# Patient Record
Sex: Female | Born: 1970 | Race: Black or African American | Hispanic: No | Smoking: Former smoker
Health system: Southern US, Community
[De-identification: ages and names within clinical notes are randomized; demographics above are authoritative.]

## PROBLEM LIST (undated history)

## (undated) DIAGNOSIS — F1910 Other psychoactive substance abuse, uncomplicated: Secondary | ICD-10-CM

## (undated) DIAGNOSIS — Z5900 Homelessness unspecified: Secondary | ICD-10-CM

## (undated) DIAGNOSIS — I639 Cerebral infarction, unspecified: Secondary | ICD-10-CM

## (undated) DIAGNOSIS — I10 Essential (primary) hypertension: Secondary | ICD-10-CM

## (undated) HISTORY — PX: CHOLECYSTECTOMY: SHX55

## (undated) HISTORY — PX: OTHER SURGICAL HISTORY: SHX169

---

## 1998-04-20 ENCOUNTER — Emergency Department (HOSPITAL_COMMUNITY): Admission: EM | Admit: 1998-04-20 | Discharge: 1998-04-20 | Payer: Self-pay | Admitting: Emergency Medicine

## 1999-01-17 ENCOUNTER — Encounter: Admission: RE | Admit: 1999-01-17 | Discharge: 1999-01-17 | Payer: Self-pay | Admitting: Sports Medicine

## 1999-05-19 ENCOUNTER — Inpatient Hospital Stay (HOSPITAL_COMMUNITY): Admission: AD | Admit: 1999-05-19 | Discharge: 1999-05-19 | Payer: Self-pay | Admitting: Obstetrics

## 1999-05-20 ENCOUNTER — Encounter: Payer: Self-pay | Admitting: Obstetrics

## 1999-05-29 ENCOUNTER — Other Ambulatory Visit: Admission: RE | Admit: 1999-05-29 | Discharge: 1999-05-29 | Payer: Self-pay | Admitting: Obstetrics

## 1999-07-21 ENCOUNTER — Inpatient Hospital Stay (HOSPITAL_COMMUNITY): Admission: AD | Admit: 1999-07-21 | Discharge: 1999-07-21 | Payer: Self-pay | Admitting: Obstetrics

## 1999-08-08 ENCOUNTER — Emergency Department (HOSPITAL_COMMUNITY): Admission: EM | Admit: 1999-08-08 | Discharge: 1999-08-08 | Payer: Self-pay | Admitting: Emergency Medicine

## 2000-06-24 ENCOUNTER — Other Ambulatory Visit: Admission: RE | Admit: 2000-06-24 | Discharge: 2000-06-24 | Payer: Self-pay | Admitting: Obstetrics

## 2000-07-21 ENCOUNTER — Inpatient Hospital Stay (HOSPITAL_COMMUNITY): Admission: AD | Admit: 2000-07-21 | Discharge: 2000-07-21 | Payer: Self-pay | Admitting: Obstetrics

## 2000-11-06 ENCOUNTER — Inpatient Hospital Stay (HOSPITAL_COMMUNITY): Admission: AD | Admit: 2000-11-06 | Discharge: 2000-11-06 | Payer: Self-pay | Admitting: Obstetrics

## 2000-12-06 ENCOUNTER — Observation Stay (HOSPITAL_COMMUNITY): Admission: AD | Admit: 2000-12-06 | Discharge: 2000-12-07 | Payer: Self-pay | Admitting: Obstetrics

## 2001-02-07 ENCOUNTER — Inpatient Hospital Stay (HOSPITAL_COMMUNITY): Admission: AD | Admit: 2001-02-07 | Discharge: 2001-02-09 | Payer: Self-pay | Admitting: Obstetrics

## 2001-02-07 ENCOUNTER — Encounter: Payer: Self-pay | Admitting: Obstetrics

## 2001-02-08 ENCOUNTER — Encounter (HOSPITAL_BASED_OUTPATIENT_CLINIC_OR_DEPARTMENT_OTHER): Payer: Self-pay | Admitting: General Surgery

## 2002-03-15 ENCOUNTER — Encounter: Payer: Self-pay | Admitting: Obstetrics

## 2002-03-15 ENCOUNTER — Inpatient Hospital Stay (HOSPITAL_COMMUNITY): Admission: AD | Admit: 2002-03-15 | Discharge: 2002-03-20 | Payer: Self-pay | Admitting: Obstetrics

## 2002-09-24 ENCOUNTER — Emergency Department (HOSPITAL_COMMUNITY): Admission: EM | Admit: 2002-09-24 | Discharge: 2002-09-25 | Payer: Self-pay | Admitting: Emergency Medicine

## 2002-09-25 ENCOUNTER — Emergency Department (HOSPITAL_COMMUNITY): Admission: EM | Admit: 2002-09-25 | Discharge: 2002-09-25 | Payer: Self-pay | Admitting: Emergency Medicine

## 2017-03-02 ENCOUNTER — Inpatient Hospital Stay (HOSPITAL_COMMUNITY)
Admission: AD | Admit: 2017-03-02 | Discharge: 2017-03-04 | DRG: 885 | Disposition: A | Discharge status: Home / Self Care | Payer: No Typology Code available for payment source | Source: Intra-hospital | Attending: Psychiatry | Admitting: Psychiatry

## 2017-03-02 ENCOUNTER — Emergency Department (HOSPITAL_COMMUNITY): Payer: Self-pay

## 2017-03-02 ENCOUNTER — Emergency Department (HOSPITAL_COMMUNITY)
Admission: EM | Admit: 2017-03-02 | Discharge: 2017-03-02 | Disposition: A | Discharge status: Home / Self Care | Payer: Self-pay | Attending: Emergency Medicine | Admitting: Emergency Medicine

## 2017-03-02 ENCOUNTER — Encounter (HOSPITAL_COMMUNITY): Payer: Self-pay | Admitting: Emergency Medicine

## 2017-03-02 DIAGNOSIS — F332 Major depressive disorder, recurrent severe without psychotic features: Principal | ICD-10-CM | POA: Diagnosis present

## 2017-03-02 DIAGNOSIS — T65892A Toxic effect of other specified substances, intentional self-harm, initial encounter: Secondary | ICD-10-CM | POA: Diagnosis present

## 2017-03-02 DIAGNOSIS — T5491XA Toxic effect of unspecified corrosive substance, accidental (unintentional), initial encounter: Secondary | ICD-10-CM

## 2017-03-02 DIAGNOSIS — G4700 Insomnia, unspecified: Secondary | ICD-10-CM | POA: Diagnosis present

## 2017-03-02 DIAGNOSIS — T543X2A Toxic effect of corrosive alkalis and alkali-like substances, intentional self-harm, initial encounter: Secondary | ICD-10-CM | POA: Insufficient documentation

## 2017-03-02 DIAGNOSIS — F17210 Nicotine dependence, cigarettes, uncomplicated: Secondary | ICD-10-CM | POA: Diagnosis present

## 2017-03-02 DIAGNOSIS — Z79899 Other long term (current) drug therapy: Secondary | ICD-10-CM

## 2017-03-02 LAB — I-STAT BETA HCG BLOOD, ED (MC, WL, AP ONLY): I-stat hCG, quantitative: 5.7 m[IU]/mL — ABNORMAL HIGH (ref <5)

## 2017-03-02 LAB — COMPREHENSIVE METABOLIC PANEL
ALBUMIN: 4.2 g/dL (ref 3.5–5.0)
ALT: 12 U/L — ABNORMAL LOW (ref 14–54)
AST: 17 U/L (ref 15–41)
Alkaline Phosphatase: 88 U/L (ref 38–126)
Anion gap: 10 (ref 5–15)
BILIRUBIN TOTAL: 0.6 mg/dL (ref 0.3–1.2)
BUN: 8 mg/dL (ref 6–20)
CHLORIDE: 103 mmol/L (ref 101–111)
CO2: 27 mmol/L (ref 22–32)
Calcium: 9.2 mg/dL (ref 8.9–10.3)
Creatinine, Ser: 0.61 mg/dL (ref 0.44–1.00)
GFR calc Af Amer: >60 mL/min (ref >60)
GFR calc non Af Amer: >60 mL/min (ref >60)
GLUCOSE: 85 mg/dL (ref 65–99)
POTASSIUM: 2.7 mmol/L — AB (ref 3.5–5.1)
Sodium: 140 mmol/L (ref 135–145)
TOTAL PROTEIN: 7.6 g/dL (ref 6.5–8.1)

## 2017-03-02 LAB — CBG MONITORING, ED: GLUCOSE-CAPILLARY: 80 mg/dL (ref 65–99)

## 2017-03-02 LAB — CBC
HEMATOCRIT: 38 % (ref 36.0–46.0)
HEMOGLOBIN: 11.3 g/dL — AB (ref 12.0–15.0)
MCH: 23 pg — ABNORMAL LOW (ref 26.0–34.0)
MCHC: 29.7 g/dL — ABNORMAL LOW (ref 30.0–36.0)
MCV: 77.4 fL — AB (ref 78.0–100.0)
Platelets: 285 10*3/uL (ref 150–400)
RBC: 4.91 MIL/uL (ref 3.87–5.11)
RDW: 17.3 % — ABNORMAL HIGH (ref 11.5–15.5)
WBC: 7 10*3/uL (ref 4.0–10.5)

## 2017-03-02 LAB — RAPID URINE DRUG SCREEN, HOSP PERFORMED
AMPHETAMINES: NOT DETECTED
BARBITURATES: NOT DETECTED
BENZODIAZEPINES: NOT DETECTED
Cocaine: POSITIVE — AB
Opiates: NOT DETECTED
TETRAHYDROCANNABINOL: NOT DETECTED

## 2017-03-02 LAB — SALICYLATE LEVEL: Salicylate Lvl: <7 mg/dL (ref 2.8–30.0)

## 2017-03-02 LAB — ETHANOL: Alcohol, Ethyl (B): <5 mg/dL (ref <5)

## 2017-03-02 LAB — MAGNESIUM: Magnesium: 1.9 mg/dL (ref 1.7–2.4)

## 2017-03-02 LAB — ACETAMINOPHEN LEVEL: Acetaminophen (Tylenol), Serum: <10 ug/mL — ABNORMAL LOW (ref 10–30)

## 2017-03-02 LAB — HCG, QUANTITATIVE, PREGNANCY: hCG, Beta Chain, Quant, S: <1 m[IU]/mL (ref <5)

## 2017-03-02 MED ORDER — ALUM & MAG HYDROXIDE-SIMETH 200-200-20 MG/5ML PO SUSP: 30.0000 mL | Freq: Four times a day (QID) | ORAL | Status: DC | PRN

## 2017-03-02 MED ORDER — POTASSIUM CHLORIDE 10 MEQ/100ML IV SOLN
10.0000 meq | INTRAVENOUS | Status: AC
  Administered 2017-03-02 (×2): 10 meq via INTRAVENOUS
  Filled 2017-03-02 (×3): qty 100

## 2017-03-02 MED ORDER — MAGNESIUM HYDROXIDE 400 MG/5ML PO SUSP: 30.0000 mL | Freq: Every day | ORAL | Status: DC | PRN

## 2017-03-02 MED ORDER — POTASSIUM CHLORIDE CRYS ER 20 MEQ PO TBCR
60.0000 meq | EXTENDED_RELEASE_TABLET | Freq: Once | ORAL | Status: AC
  Administered 2017-03-02: 60 meq via ORAL
  Filled 2017-03-02: qty 3

## 2017-03-02 MED ORDER — ONDANSETRON HCL 4 MG PO TABS: 4.0000 mg | ORAL_TABLET | Freq: Three times a day (TID) | ORAL | Status: DC | PRN

## 2017-03-02 MED ORDER — ACETAMINOPHEN 325 MG PO TABS: 650.0000 mg | ORAL_TABLET | ORAL | Status: DC | PRN

## 2017-03-02 NOTE — ED Notes (Signed)
Bed: [id number] Expected date:  Expected time:  Means of arrival:  Comments: [id number]

## 2017-03-02 NOTE — ED Notes (Signed)
Patient cell phone, car keys, and phone charger added to trash bag.

## 2017-03-02 NOTE — ED Triage Notes (Addendum)
Per [facility] patient comes from home for ingesting mixture of Comet, all purpose cleaner and glass cleaner with ammonia together in cup and drinking it. Patient called her daughter who is the one who called EMS.  Daughter told EMS that patient done this before about 20 years ago when she lived in [place].  Patient not wanting to answer questions at this time.  Patient does c/o sore throat and stomach burning.

## 2017-03-02 NOTE — ED Notes (Signed)
Introduced self to patient. Pt oriented to unit expectations.  Assessed pt for:  A) Anxiety &/or agitation: On admission to the [facility] pt is in bed, guarded with minimal verbal responses. She voiced no complaints. She did request a ginger-ale when asked if she wants something to drink.  S) Safety: Safety maintained with q-15-minute checks and hourly rounds by staff.  A) ADLs: Pt able to perform ADLs independently.  P) Pick-Up (room cleanliness): Pt's room clean and free of clutter.

## 2017-03-02 NOTE — ED Notes (Signed)
Main lab is going to add magnesium onto specimen already down in lab.

## 2017-03-02 NOTE — ED Notes (Signed)
Patient given scrubs and socks and instructed to get changed into them.

## 2017-03-02 NOTE — ED Notes (Signed)
Patient left the unit in accompany of pelham. Patient is stable and ambulatory. No distress noted. Belongings (wallet and phone) send with patient.

## 2017-03-02 NOTE — ED Notes (Signed)
According to Stacy, charge nurse in ED, pt ate a tray for lunch and drank tea on the tray. In the [facility] pt ate peanut butter and crackers and a ginger-ale. She said that her stomach feels good.

## 2017-03-02 NOTE — BH Assessment (Signed)
Alexandra Capriceonrad, DNP, recommends INPT treatment. [facility] at [facility]. Support paperwork completed. Nursing report [phone].

## 2017-03-02 NOTE — ED Notes (Signed)
Patient got out of bed and shut door to room.  This RN opened door to room and explained to patient that door has to stay open.

## 2017-03-02 NOTE — ED Notes (Addendum)
When asked if anyone harming or threatening her patient just responds by shaking head no.  Patient asked several times if wanting to harm herself, and if she drank mixture of products in attempt to harm herself. Patient stares off and refuses to answer question.  Patient does report vomiting at house.

## 2017-03-02 NOTE — ED Notes (Signed)
Patient's husband states that for several months now patient has been on drugs. He is requesting to talk to EDP. Made Dr Verdie Mosherliu aware.

## 2017-03-02 NOTE — ED Notes (Addendum)
Patient informed about [facility]. States she is aware of that. Report called and was asked to hold patient till 10 pm.

## 2017-03-02 NOTE — ED Notes (Signed)
ED Provider at bedside. 

## 2017-03-02 NOTE — Progress Notes (Signed)
CSW called and spoke to [employer] Secretary.  Asked that pt. Transfer be delayed until 10 PM.  Timmothy EulerJean T. Kaylyn LimSutter, MSW, LCSWA Clinical Social Work Disposition [phone]

## 2017-03-02 NOTE — ED Notes (Signed)
RN notified of abnormal lab 

## 2017-03-02 NOTE — ED Notes (Signed)
Patient has one white trash bag with dress, bottle of glass cleaner, bottle comet, and bottle of all purpose cleaner and cup with comet residue in it. Bag located in cabinet at blue nurse's station

## 2017-03-02 NOTE — ED Notes (Signed)
Called Poison Control, spoke with Rose,   Recommendations:  NPO min of 1 hour, obtain chest and abd xray, EKG, acetaminophen level and electrolytes.   If after hour-- all tests come back good and patient is stable, PO challenge patient with water.  If patient still having sore throat, swallowing issues, ongoing n/v then consult with GI.

## 2017-03-02 NOTE — BH Assessment (Signed)
Assessment Note  Alexandra Buckley is an 46 y.o. female. Patient presents to [facility] after a intentional overdose on cleaning products. She mix a concoction of amonia, bleach all purpose cleaner, glass cleaner, and comment prior to arrival. Her daughter apparently found out an contacted 911/EMS. Patient feeling depressed for the past month. Sts that her spouse is the trigger for her suicide attempt/depression. Patient refused to elaborate any further. The spouse spoke to ED staff sts that they are now seperated. He showed the EDP a video where she sent him this morning drinking the cleaners. He also expressed concerns that patient may be abusing drugs.   Patient admits that the igestion was a intentional suicide attempt. Denies prior suicide attempts/gestures. No self mutilating behaviors. She reports depressive symptoms including hopelessness, isolating self from others, worthlessness, and crying spells. No family history of mental health illness. No HI. No AVH's. She does not have a current psychiatrist and/or therapist. No history of INPT mental health issues. She denies current alcohol an drug use. Patient did however admit to "poppin Percocet's and Ambien" in March.  Diagnosis: Major Depressive Disorder, Recurrent, Severe, without psychotic features  Past Medical History: History reviewed. No pertinent past medical history.  History reviewed. No pertinent surgical history.  Family History: No family history on file.  Social History:  reports that she has been smoking Cigarettes.  She has never used smokeless tobacco. She reports that she does not drink alcohol. Her drug history is not on file.  Additional Social History:  Alcohol / Drug Use Pain Medications: SEE MAR Prescriptions: SEE MAR Over the Counter: SEE MAR History of alcohol / drug use?: Yes Substance #1 Name of Substance 1: "I was popping Percocets and Ambien in March...but I don't do that anymore" 1 - Age of First Use: 40's 1 -  Amount (size/oz): varied 1 - Frequency: "on and off throughout the month of March" 1 - Duration: on-going  1 - Last Use / Amount: March 2018  CIWA: CIWA-Ar BP: (!) 150/83 Pulse Rate: 76 COWS:    Allergies: No Known Allergies  Home Medications:  (Not in a hospital admission)  OB/GYN Status:  Patient's last menstrual period was 02/23/2017.  General Assessment Data Location of Assessment: [facility] TTS Assessment: In system Is this a Tele or Face-to-Face Assessment?: Face-to-Face Is this an Initial Assessment or a Re-assessment for this encounter?: Initial Assessment Marital status: Single Maiden name:  (unk) Is patient pregnant?: No Pregnancy Status: No Living Arrangements: Alone Can pt return to current living arrangement?: Yes Admission Status: Voluntary Is patient capable of signing voluntary admission?: Yes Referral Source: Self/Family/Friend Insurance type:  (SP)     Crisis Care Plan Living Arrangements: Alone Legal Guardian: Other: (no legal guardian ) Name of Psychiatrist:  (no psychiatrist ) Name of Therapist:  (no therapist )  Education Status Is patient currently in school?: No Current Grade: n/a Highest grade of school patient has completed:  (12th grade) Name of school:  (n/a) Contact person:  (n/a)  Risk to self with the past 6 months Suicidal Ideation: Yes-Currently Present Has patient been a risk to self within the past 6 months prior to admission? : Yes Suicidal Intent: Yes-Currently Present Has patient had any suicidal intent within the past 6 months prior to admission? : Yes Is patient at risk for suicide?: Yes Suicidal Plan?: Yes-Currently Present Has patient had any suicidal plan within the past 6 months prior to admission? : Yes Specify Current Suicidal Plan:  (overdose using cleaning products)  Access to Means: Yes Specify Access to Suicidal Means:  (cleaning prodcucts ) What has been your use of drugs/alcohol within the last 12 months?:   (denies current use; reports a hx of "pill popping") Previous Attempts/Gestures: No How many times?:  (denies; 0) Other Self Harm Risks:  (denies self harm risk ) Triggers for Past Attempts: Other (Comment) (no past attempts or gestures ) Intentional Self Injurious Behavior: None Family Suicide History: No Recent stressful life event(s): Other (Comment) ("My husband"...pt did not elaborate any further ) Persecutory voices/beliefs?: No Depression: Yes Substance abuse history and/or treatment for substance abuse?: Yes Suicide prevention information given to non-admitted patients: Not applicable  Risk to Others within the past 6 months Homicidal Ideation: No Does patient have any lifetime risk of violence toward others beyond the six months prior to admission? : No Thoughts of Harm to Others: No Current Homicidal Intent: No Current Homicidal Plan: No Access to Homicidal Means: No Identified Victim:  (n/a) History of harm to others?: No Assessment of Violence: None Noted Violent Behavior Description:  (patient is calm and cooperative ) Does patient have access to weapons?: No Criminal Charges Pending?: No Does patient have a court date: No Is patient on probation?: Yes  Psychosis Hallucinations: None noted Delusions: None noted  Mental Status Report Appearance/Hygiene: In scrubs Eye Contact: Good Motor Activity: Freedom of movement Speech: Logical/coherent Level of Consciousness: Alert Mood: Depressed Affect: Appropriate to circumstance Anxiety Level: None Thought Processes: Relevant, Coherent Judgement: Impaired Orientation: Person, Time, Situation, Place Obsessive Compulsive Thoughts/Behaviors: None  Cognitive Functioning Concentration: Decreased Memory: Recent Intact, Remote Intact IQ: Average Insight: Poor Impulse Control: Poor Appetite: Poor Weight Loss:  (none reported) Weight Gain:  (none reported) Sleep: Decreased Total Hours of Sleep:  ("3 to 6 hrs per  night") Vegetative Symptoms: None  ADLScreening [facility] Assessment Services) Patient's cognitive ability adequate to safely complete daily activities?: Yes Patient able to express need for assistance with ADLs?: Yes Independently performs ADLs?: Yes (appropriate for developmental age)  Prior Inpatient Therapy Prior Inpatient Therapy: No Prior Therapy Dates:  (n/a) Prior Therapy Facilty/Provider(s):  (n/a) Reason for Treatment:  (n/a)  Prior Outpatient Therapy Prior Outpatient Therapy: No Prior Therapy Dates:  (n/a) Prior Therapy Facilty/Provider(s):  (n/a) Reason for Treatment:  (n/a) Does patient have an ACCT team?: No Does patient have Intensive In-House Services?  : No Does patient have [facility] services? : No Does patient have P4CC services?: No  ADL Screening (condition at time of admission) Patient's cognitive ability adequate to safely complete daily activities?: Yes Is the patient deaf or have difficulty hearing?: No Does the patient have difficulty seeing, even when wearing glasses/contacts?: No Does the patient have difficulty concentrating, remembering, or making decisions?: No Patient able to express need for assistance with ADLs?: Yes Does the patient have difficulty dressing or bathing?: No Independently performs ADLs?: Yes (appropriate for developmental age) Does the patient have difficulty walking or climbing stairs?: No Weakness of Legs: None Weakness of Arms/Hands: None       Abuse/Neglect Assessment (Assessment to be complete while patient is alone) Physical Abuse: Denies Verbal Abuse: Denies Sexual Abuse: Denies Exploitation of patient/patient's resources: Denies Self-Neglect: Denies Values / Beliefs Cultural Requests During Hospitalization: None Spiritual Requests During Hospitalization: None   Advance Directives (For Healthcare) Does Patient Have a Medical Advance Directive?: No Would patient like information on creating a medical advance  directive?: No - Patient declined Nutrition Screen- [facility] Patient's home diet: Regular  Additional Information 1:1 In Past  12 Months?: No CIRT Risk: No Elopement Risk: No Does patient have medical clearance?: Yes     Disposition:  Disposition Initial Assessment Completed for this Encounter: Yes Disposition of Patient: Inpatient treatment program, Referred to (Per Renata Capriceonrad, DNP, patient meets criteria for INPT treatment) Type of inpatient treatment program: Adult  On Site Evaluation by:   Reviewed with Physician:    Melynda Rippleoyka Rayburn Mundis 03/02/2017 3:45 PM

## 2017-03-02 NOTE — ED Notes (Signed)
Called [facility], RN unable to take report at this time. Will call back in about 15 mins per RN request.

## 2017-03-02 NOTE — ED Provider Notes (Signed)
[facility]-EMERGENCY DEPT Provider Note   CSN: [id number] Arrival date & time: 03/02/17  0830     History   Chief Complaint Chief Complaint  Patient presents with  . Ingestion    HPI Alexandra Buckley is a 46 y.o. female.  The history is provided by the patient.  Ingestion  This is a new problem. The current episode started 1 to 2 hours ago. The problem occurs rarely. The problem has not changed since onset.Associated symptoms include abdominal pain. Pertinent negatives include no chest pain, no headaches and no shortness of breath. Nothing aggravates the symptoms. Nothing relieves the symptoms. She has tried nothing for the symptoms.   46 year old female who presents with intentional ingestion, just prior to arrival. Patient reports that she drank a cupful of Comet, bleach all-purpose cleaner and glass cleaner with ammonia just prior to arrival. Denies any other co-ingestions such as alcohol, drug abuse, pills/medications. Does endorse suicide attempt, but is not forth coming in any further details. Endorses nausea and vomiting after ingestion, and also complains of burning in the back of her throat and in her abdomen.  I also spoke with patient's husband, who she is now separated from. He showed me video where she had sent him this morning of her drinking a backed of mixed cleaner. He expresses concern that she over the past few months has had drug abuse/addiction.    History reviewed. No pertinent past medical history.  There are no active problems to display for this patient.   History reviewed. No pertinent surgical history.  OB History    No data available       Home Medications    Prior to Admission medications   Not on File    Family History No family history on file.  Social History Social History  Substance Use Topics  . Smoking status: Current Every Day Smoker    Types: Cigarettes  . Smokeless tobacco: Never Used  . Alcohol use No     Allergies   Patient  has no known allergies.   Review of Systems Review of Systems  Constitutional: Negative for fever.  HENT: Positive for sore throat.   Respiratory: Negative for shortness of breath.   Cardiovascular: Negative for chest pain.  Gastrointestinal: Positive for abdominal pain.  Allergic/Immunologic: Negative for immunocompromised state.  Neurological: Negative for headaches.  Hematological: Does not bruise/bleed easily.  Psychiatric/Behavioral: Positive for self-injury and suicidal ideas. Negative for confusion.     Physical Exam Updated Vital Signs BP (!) 150/83   Pulse 76   Temp 98.5 F (36.9 C) (Oral)   Resp 19   LMP 02/23/2017   SpO2 100%   Physical Exam Physical Exam  Nursing note and vitals reviewed. Constitutional: Well developed, well nourished, non-toxic, and in no acute distress Head: Normocephalic and atraumatic.  Mouth/Throat: Oropharynx is clear and moist.  Neck: Normal range of motion. Neck supple.  Cardiovascular: Normal rate and regular rhythm.   Pulmonary/Chest: Effort normal and breath sounds normal.  Abdominal: Soft. There is mild epigastric tenderness. There is no rebound and no guarding.  Musculoskeletal: Normal range of motion.  Neurological: Alert, no facial droop, fluent speech, moves all extremities symmetrically Skin: Skin is warm and dry.  Psychiatric: withdrawn, no eye contact, occasionally tearful   ED Treatments / Results  Labs (all labs ordered are listed, but only abnormal results are displayed) Labs Reviewed  COMPREHENSIVE METABOLIC PANEL - Abnormal; Notable for the following:       Result Value  Potassium 2.7 (*)    ALT 12 (*)    All other components within normal limits  ACETAMINOPHEN LEVEL - Abnormal; Notable for the following:    Acetaminophen (Tylenol), Serum <10 (*)    All other components within normal limits  CBC - Abnormal; Notable for the following:    Hemoglobin 11.3 (*)    MCV 77.4 (*)    MCH 23.0 (*)    MCHC 29.7 (*)     RDW 17.3 (*)    All other components within normal limits  I-STAT BETA HCG BLOOD, ED ([facility], [facility], [facility] ONLY) - Abnormal; Notable for the following:    I-stat hCG, quantitative 5.7 (*)    All other components within normal limits  ETHANOL  SALICYLATE LEVEL  HCG, QUANTITATIVE, PREGNANCY  MAGNESIUM  RAPID URINE DRUG SCREEN, HOSP PERFORMED  CBG MONITORING, ED    EKG  EKG Interpretation  Date/Time:  Tuesday Mar 02 2017 08:33:10 EDT Ventricular Rate:  83 PR Interval:    QRS Duration: 77 QT Interval:  431 QTC Calculation: 507 R Axis:   26 Text Interpretation:  Ectopic atrial rhythm Borderline prolonged QT interval no prior EKG  Confirmed by LIU MD, DANA [phone]([id number]) on 03/02/2017 8:47:24 AM       Radiology Dg Chest 2 View  Result Date: 03/02/2017 CLINICAL DATA:  Burning in the throat, chest and abdomen after ingesting cleaning agents this morning. Smoker. EXAM: CHEST  2 VIEW COMPARISON:  None. FINDINGS: Normal sized heart. Clear lungs with normal vascularity. No pleural fluid. Minimal thoracic spine degenerative changes. Cholecystectomy clips. IMPRESSION: No acute abnormality. Electronically Signed   By: Beckie SaltsSteven  Reid M.D.   On: 03/02/2017 12:25   Dg Abd 2 Views  Result Date: 03/02/2017 CLINICAL DATA:  Burning in the throat, chest and abdomen after ingesting cleaning agents this morning. EXAM: ABDOMEN - 2 VIEW COMPARISON:  None. FINDINGS: Normal caliber loops of small bowel and colon with air-fluid levels. No free peritoneal air. Unremarkable bones. IMPRESSION: No acute abnormality. Electronically Signed   By: Beckie SaltsSteven  Reid M.D.   On: 03/02/2017 12:26    Procedures Procedures (including critical care time)  Medications Ordered in ED Medications  potassium chloride 10 mEq in 100 mL IVPB (10 mEq Intravenous New Bag/Given 03/02/17 1222)  potassium chloride SA (K-DUR,KLOR-CON) CR tablet 60 mEq (not administered)  acetaminophen (TYLENOL) tablet 650 mg (not administered)  ondansetron (ZOFRAN)  tablet 4 mg (not administered)  alum & mag hydroxide-simeth (MAALOX/MYLANTA) 200-200-20 MG/5ML suspension 30 mL (not administered)     Initial Impression / Assessment and Plan / ED Course  I have reviewed the triage vital signs and the nursing notes.  Pertinent labs & imaging results that were available during my care of the patient were reviewed by me and considered in my medical decision making (see chart for details).    The patient's nurse spoke with poison control who recommended an by mouth 1 hour. Recommending tox labs with chest and abdominal x-ray. If after one hour in all workup reassuring, recommending by mouth challenge.  Workup overall unremarkable aside from hypokalemia of 2.7. It is repleted IV and orally. Chest x-ray and abdominal x-ray visualized and shows no acute processes. I has been able to drink fluids without difficulty, and is felt to be medically cleared for TTS consult.  Final Clinical Impressions(s) / ED Diagnoses   Final diagnoses:  Ingestion of bleach    New Prescriptions New Prescriptions   No medications on file     Liu,  Neysa Bonito, MD 03/02/17 1318

## 2017-03-02 NOTE — ED Notes (Signed)
Rose with Poison Control cleared pt and closed her file because pt ate a lunch tray and drank a glass of tea, (according to [employer]Stacy charge nurse in ED), and pt also had peanut butter crackers and ginger-ale for snack and tolerated it well.

## 2017-03-02 NOTE — ED Notes (Signed)
Patient transported to X-ray 

## 2017-03-02 NOTE — ED Notes (Signed)
Bed: [id number] Expected date:  Expected time:  Means of arrival:  Comments: EMS-injestion

## 2017-03-02 NOTE — ED Notes (Signed)
Informed lab to add on lab

## 2017-03-03 ENCOUNTER — Encounter (HOSPITAL_COMMUNITY): Payer: Self-pay | Admitting: *Deleted

## 2017-03-03 DIAGNOSIS — R45851 Suicidal ideations: Secondary | ICD-10-CM

## 2017-03-03 DIAGNOSIS — F14120 Cocaine abuse with intoxication, uncomplicated: Secondary | ICD-10-CM

## 2017-03-03 DIAGNOSIS — F332 Major depressive disorder, recurrent severe without psychotic features: Principal | ICD-10-CM
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
## 2017-03-03 NOTE — BHH Group Notes (Signed)
B[facility] LCSW Group Therapy  03/03/2017 12:53 PM  Type of Therapy:  Group Therapy  Participation Level:  Active  Participation Quality:  Appropriate  Affect:  Appropriate  Cognitive:  Oriented  Insight:  Improving  Engagement in Therapy:  Engaged  Modes of Intervention:  Confrontation, Discussion, Education, Problem-solving, Socialization and Support  Summary of Progress/Problems: Today's Topic: Overcoming Obstacles. Patients identified one short term goal and potential obstacles in reaching this goal. Patients processed barriers involved in overcoming these obstacles. Patients identified steps necessary for overcoming these obstacles and explored motivation (internal and external) for facing these difficulties head on.   Alys Dulak N Smart LCSW 03/03/2017, 12:53 PM

## 2017-03-03 NOTE — H&P (Signed)
Psychiatric Admission Assessment Adult  Patient Identification: Alexandra Buckley MRN:  [id number] Date of Evaluation:  03/03/2017 Chief Complaint:  MDD REC SEV Principal Diagnosis: MDD (major depressive disorder), recurrent severe, without psychosis ([place]) Diagnosis:   Patient Active Problem List   Diagnosis Date Noted  . MDD (major depressive disorder), recurrent severe, without psychosis ([place]) [F33.2] 03/02/2017   History of Present Illness: Alexandra Buckley is an 46 y.o. female, married, have six children and none lives with her, admitted from [facility] fo intentional drug overdose as a suicide attempt. Patient states that she is seeking attention from family members and for intention to end her life and she has endorses drinking occasionally but no drug absue. She does not want to report separation or divorce but heard from her daughter that her husband is divorcing her. She minimizes her depression, anxiety and suicide or homicide ideation, intention or plan. She can not contract for safety during this evaluation. She denied psychosis, delusion and or paranoia.she becomes emotional, tearful and guilty about her intentional overdose. UDS is positive for cocaine. She denied regular drug abuse. Occasional alcohol drinking.   Below information from behavioral health assessment has been reviewed by me and I agreed with the findings. Patient presents to [facility] after a intentional overdose on cleaning products. She mix a concoction of amonia, bleach all purpose cleaner, glass cleaner, and comment prior to arrival. Her daughter apparently found out an contacted 911/EMS. Patient feeling depressed for the past month. Sts that her spouse is the trigger for her suicide attempt/depression. Patient refused to elaborate any further. The spouse spoke to ED staff sts that they are now seperated. He showed the EDP a video where she sent him this morning drinking the cleaners. He also expressed concerns that patient may be  abusing drugs.   Patient admits that the igestion was a intentional suicide attempt. Denies prior suicide attempts/gestures. No self mutilating behaviors. She reports depressive symptoms including hopelessness, isolating self from others, worthlessness, and crying spells. No family history of mental health illness. No HI. No AVH's. She does not have a current psychiatrist and/or therapist. No history of INPT mental health issues. She denies current alcohol an drug use. Patient did however admit to "poppin Percocet's and Ambien" in March.  Associated Signs/Symptoms: Depression Symptoms:  depressed mood, anhedonia, psychomotor retardation, fatigue, feelings of worthlessness/guilt, hopelessness, recurrent thoughts of death, suicidal attempt, anxiety, loss of energy/fatigue, weight loss, decreased labido, decreased appetite, (Hypo) Manic Symptoms:  Distractibility, Impulsivity, Irritable Mood, Labiality of Mood, Anxiety Symptoms:  Excessive Worry, Psychotic Symptoms:  denied. PTSD Symptoms: NA Total Time spent with patient: 1 hour  Past Psychiatric History: Denied.  Is the patient at risk to self? Yes.    Has the patient been a risk to self in the past 6 months? No.  Has the patient been a risk to self within the distant past? No.  Is the patient a risk to others? No.  Has the patient been a risk to others in the past 6 months? No.  Has the patient been a risk to others within the distant past? No.   Prior Inpatient Therapy:   Prior Outpatient Therapy:    Alcohol Screening: 1. How often do you have a drink containing alcohol?: Monthly or less 2. How many drinks containing alcohol do you have on a typical day when you are drinking?: 1 or 2 3. How often do you have six or more drinks on one occasion?: Less than monthly Preliminary Score: 1 9. Have  you or someone else been injured as a result of your drinking?: No 10. Has a relative or friend or a doctor or another health  worker been concerned about your drinking or suggested you cut down?: No Alcohol Use Disorder Identification Test Final Score (AUDIT): 2 Brief Intervention: AUDIT score less than 7 or less-screening does not suggest unhealthy drinking-brief intervention not indicated Substance Abuse History in the last 12 months:  No. Consequences of Substance Abuse: NA Previous Psychotropic Medications: No  Psychological Evaluations: Yes  Past Medical History: History reviewed. No pertinent past medical history. History reviewed. No pertinent surgical history. Family History: History reviewed. No pertinent family history. Family Psychiatric  History: Denied Tobacco Screening: Have you used any form of tobacco in the last 30 days? (Cigarettes, Smokeless Tobacco, Cigars, and/or Pipes): Yes Tobacco use, Select all that apply: 5 or more cigarettes per day Are you interested in Tobacco Cessation Medications?: No, patient refused Counseled patient on smoking cessation including recognizing danger situations, developing coping skills and basic information about quitting provided: Refused/Declined practical counseling Social History:  History  Alcohol Use No     History  Drug use: Unknown    Additional Social History:                           Allergies:  No Known Allergies Lab Results:  Results for orders placed or performed during the hospital encounter of 03/02/17 (from the past 48 hour(s))  CBG monitoring, ED     Status: None   Collection Time: 03/02/17  8:42 AM  Result Value Ref Range   Glucose-Capillary 80 65 - 99 mg/dL  Comprehensive metabolic panel     Status: Abnormal   Collection Time: 03/02/17  9:18 AM  Result Value Ref Range   Sodium 140 135 - 145 mmol/L   Potassium 2.7 (LL) 3.5 - 5.1 mmol/L    Comment: CRITICAL RESULT CALLED TO, READ BACK BY AND VERIFIED WITH: S.WEST RN [id number] A.QUIZON    Chloride 103 101 - 111 mmol/L   CO2 27 22 - 32 mmol/L   Glucose, Bld 85 65 - 99  mg/dL   BUN 8 6 - 20 mg/dL   Creatinine, Ser 0.61 0.44 - 1.00 mg/dL   Calcium 9.2 8.9 - 10.3 mg/dL   Total Protein 7.6 6.5 - 8.1 g/dL   Albumin 4.2 3.5 - 5.0 g/dL   AST 17 15 - 41 U/L   ALT 12 (L) 14 - 54 U/L   Alkaline Phosphatase 88 38 - 126 U/L   Total Bilirubin 0.6 0.3 - 1.2 mg/dL   GFR calc non Af Amer >60 >60 mL/min   GFR calc Af Amer >60 >60 mL/min    Comment: (NOTE) The eGFR has been calculated using the CKD EPI equation. This calculation has not been validated in all clinical situations. eGFR's persistently <60 mL/min signify possible Chronic Kidney Disease.    Anion gap 10 5 - 15  Ethanol     Status: None   Collection Time: 03/02/17  9:18 AM  Result Value Ref Range   Alcohol, Ethyl (B) <5 <5 mg/dL    Comment:        LOWEST DETECTABLE LIMIT FOR SERUM ALCOHOL IS 5 mg/dL FOR MEDICAL PURPOSES ONLY   Salicylate level     Status: None   Collection Time: 03/02/17  9:18 AM  Result Value Ref Range   Salicylate Lvl <0.1 2.8 - 30.0 mg/dL  Acetaminophen level  Status: Abnormal   Collection Time: 03/02/17  9:18 AM  Result Value Ref Range   Acetaminophen (Tylenol), Serum <10 (L) 10 - 30 ug/mL    Comment:        THERAPEUTIC CONCENTRATIONS VARY SIGNIFICANTLY. A RANGE OF 10-30 ug/mL MAY BE AN EFFECTIVE CONCENTRATION FOR MANY PATIENTS. HOWEVER, SOME ARE BEST TREATED AT CONCENTRATIONS OUTSIDE THIS RANGE. ACETAMINOPHEN CONCENTRATIONS >150 ug/mL AT 4 HOURS AFTER INGESTION AND >50 ug/mL AT 12 HOURS AFTER INGESTION ARE OFTEN ASSOCIATED WITH TOXIC REACTIONS.   cbc     Status: Abnormal   Collection Time: 03/02/17  9:18 AM  Result Value Ref Range   WBC 7.0 4.0 - 10.5 K/uL   RBC 4.91 3.87 - 5.11 MIL/uL   Hemoglobin 11.3 (L) 12.0 - 15.0 g/dL   HCT 38.0 36.0 - 46.0 %   MCV 77.4 (L) 78.0 - 100.0 fL   MCH 23.0 (L) 26.0 - 34.0 pg   MCHC 29.7 (L) 30.0 - 36.0 g/dL   RDW 17.3 (H) 11.5 - 15.5 %   Platelets 285 150 - 400 K/uL  hCG, quantitative, pregnancy     Status: None    Collection Time: 03/02/17  9:18 AM  Result Value Ref Range   hCG, Beta Chain, Quant, S <1 <5 mIU/mL    Comment:          GEST. AGE      CONC.  (mIU/mL)   <=1 WEEK        5 - 50     2 WEEKS       50 - 500     3 WEEKS       100 - 10,000     4 WEEKS     1,000 - 30,000     5 WEEKS     3,500 - 115,000   6-8 WEEKS     12,000 - 270,000    12 WEEKS     15,000 - 220,000        FEMALE AND NON-PREGNANT FEMALE:     LESS THAN 5 mIU/mL   Magnesium     Status: None   Collection Time: 03/02/17  9:22 AM  Result Value Ref Range   Magnesium 1.9 1.7 - 2.4 mg/dL  I-Stat beta hCG blood, ED     Status: Abnormal   Collection Time: 03/02/17  9:39 AM  Result Value Ref Range   I-stat hCG, quantitative 5.7 (H) <5 mIU/mL   Comment 3            Comment:   GEST. AGE      CONC.  (mIU/mL)   <=1 WEEK        5 - 50     2 WEEKS       50 - 500     3 WEEKS       100 - 10,000     4 WEEKS     1,000 - 30,000        FEMALE AND NON-PREGNANT FEMALE:     LESS THAN 5 mIU/mL   Rapid urine drug screen (hospital performed)     Status: Abnormal   Collection Time: 03/02/17  1:31 PM  Result Value Ref Range   Opiates NONE DETECTED NONE DETECTED   Cocaine POSITIVE (A) NONE DETECTED   Benzodiazepines NONE DETECTED NONE DETECTED   Amphetamines NONE DETECTED NONE DETECTED   Tetrahydrocannabinol NONE DETECTED NONE DETECTED   Barbiturates NONE DETECTED NONE DETECTED    Comment:  DRUG SCREEN FOR MEDICAL PURPOSES ONLY.  IF CONFIRMATION IS NEEDED FOR ANY PURPOSE, NOTIFY LAB WITHIN 5 DAYS.        LOWEST DETECTABLE LIMITS FOR URINE DRUG SCREEN Drug Class       Cutoff (ng/mL) Amphetamine      1000 Barbiturate      200 Benzodiazepine   321 Tricyclics       224 Opiates          300 Cocaine          300 THC              50     Blood Alcohol level:  Lab Results  Component Value Date   ETH <5 82/50/0370    Metabolic Disorder Labs:  No results found for: HGBA1C, MPG No results found for: PROLACTIN No results found  for: CHOL, TRIG, HDL, CHOLHDL, VLDL, LDLCALC  Current Medications: Current Facility-Administered Medications  Medication Dose Route Frequency Provider Last Rate Last Dose  . acetaminophen (TYLENOL) tablet 650 mg  650 mg Oral Q4H PRN Withrow, Elyse Jarvis, FNP      . alum & mag hydroxide-simeth (MAALOX/MYLANTA) 200-200-20 MG/5ML suspension 30 mL  30 mL Oral Q6H PRN Withrow, [name], FNP      . magnesium hydroxide (MILK OF MAGNESIA) suspension 30 mL  30 mL Oral Daily PRN Withrow, [name], FNP      . ondansetron (ZOFRAN) tablet 4 mg  4 mg Oral Q8H PRN Withrow, Elyse Jarvis, FNP       PTA Medications: Prescriptions Prior to Admission  Medication Sig Dispense Refill Last Dose  . acetaminophen (TYLENOL) 500 MG tablet Take 1,500-2,000 mg by mouth every 4 (four) hours as needed for mild pain, moderate pain, fever or headache.   03/01/2017 at Unknown time    Musculoskeletal: Strength & Muscle Tone: within normal limits Gait & Station: normal Patient leans: N/A  Psychiatric Specialty Exam: Physical Exam  ROS  Blood pressure (!) 144/78, pulse 82, temperature 98.4 F (36.9 C), temperature source Oral, resp. rate 17, last menstrual period 02/23/2017.There is no height or weight on file to calculate BMI.  General Appearance: Guarded  Eye Contact:  Good  Speech:  Clear and Coherent  Volume:  Decreased  Mood:  Anxious, Depressed, Hopeless and Worthless  Affect:  Constricted and Depressed  Thought Process:  Coherent and Goal Directed  Orientation:  Full (Time, Place, and Person)  Thought Content:  Rumination and Tangential  Suicidal Thoughts:  Yes.  with intent/plan  Homicidal Thoughts:  No  Memory:  Immediate;   Good Recent;   Fair Remote;   Fair  Judgement:  Impaired  Insight:  Lacking  Psychomotor Activity:  Decreased  Concentration:  Concentration: Good and Attention Span: Good  Recall:  Good  Fund of Knowledge:  Good  Language:  Good  Akathisia:  Negative  Handed:  Right  AIMS (if indicated):      Assets:  Communication Skills Desire for Improvement Financial Resources/Insurance Housing Intimacy Leisure Time Physical Health Resilience Social Support Talents/Skills Transportation Vocational/Educational  ADL's:  Intact  Cognition:  WNL  Sleep:  Number of Hours: 5.75    Treatment Plan Summary: Daily contact with patient to assess and evaluate symptoms and progress in treatment and Medication management  Observation Level/Precautions:  15 minute checks  Laboratory:  admission labs reviewed.  Psychotherapy:  Group   Medications:  Consider SSRI  Consultations:  As needed  Discharge Concerns:  safety  Estimated LOS: 5-7 days  Other:     Physician Treatment Plan for Primary Diagnosis: <principal problem not specified> Long Term Goal(s): Improvement in symptoms so as ready for discharge  Short Term Goals: Ability to identify changes in lifestyle to reduce recurrence of condition will improve, Ability to verbalize feelings will improve, Ability to disclose and discuss suicidal ideas and Ability to demonstrate self-control will improve  Physician Treatment Plan for Secondary Diagnosis: Active Problems:   MDD (major depressive disorder), recurrent severe, without psychosis ([place])  Long Term Goal(s): Improvement in symptoms so as ready for discharge  Short Term Goals: Ability to identify and develop effective coping behaviors will improve, Ability to maintain clinical measurements within normal limits will improve, Compliance with prescribed medications will improve and Ability to identify triggers associated with substance abuse/mental health issues will improve  I certify that inpatient services furnished can reasonably be expected to improve the patient's condition.    Ambrose Finland, MD 5/30/20181:03 PM

## 2017-03-03 NOTE — BHH Suicide Risk Assessment (Signed)
[facility] INPATIENT:  Family/Significant Other Suicide Prevention Education  Suicide Prevention Education:  Education Completed; Alexandra OlszewskiJacques Buckley (pt's daughter)([phone] has been identified by the patient as the family member/significant other with whom the patient will be residing, and identified as the person(s) who will aid the patient in the event of a mental health crisis (suicidal ideations/suicide attempt).  With written consent from the patient, the family member/significant other has been provided the following suicide prevention education, prior to the and/or following the discharge of the patient.  The suicide prevention education provided includes the following:  Suicide risk factors  Suicide prevention and interventions  National Suicide Hotline telephone number  [facility] assessment telephone number  [facility] Assistance 911  [facility] and/or Residential Mobile Crisis Unit telephone number  Request made of family/significant other to:  Remove weapons (e.g., guns, rifles, knives), all items previously/currently identified as safety concern.    Remove drugs/medications (over-the-counter, prescriptions, illicit drugs), all items previously/currently identified as a safety concern.  The family member/significant other verbalizes understanding of the suicide prevention education information provided.  The family member/significant other agrees to remove the items of safety concern listed above.  Pt's daughter reports that pt is sounding much better. "I'm comfortable with her leaving whenever she can. She sounds a lot better now." Pt's daughter reports no concerns regarding pt's safety. "I live in the same apartment complex and will be checking in on her a lot." SPE reviewed. Aftercare reviewed.   Alexandra Buckley N Smart LCSW 03/03/2017, 3:07 PM

## 2017-03-03 NOTE — Progress Notes (Signed)
Recreation Therapy Notes  Date: 03/03/17 Time: 0930 Location: [address] Dayroom  Group Topic: Stress Management  Goal Area(s) Addresses:  Patient will verbalize importance of using healthy stress management.  Patient will identify positive emotions associated with healthy stress management.   Intervention: Stress Management  Activity :  Body Scan Meditation.  LRT introduced the stress management technique of meditation.  LRT played a meditation to allow patients to take inventory of the sensations they are feeling in their body.  Patients were to follow along as the meditation was played to fully engage in the technique.  Education:  Stress Management, Discharge Planning.   Education Outcome: Acknowledges edcuation/In group clarification offered/Needs additional education  Clinical Observations/Feedback: Pt did not attend group.   Caroll RancherMarjette Saket Hellstrom, LRT/CTRS         Lillia AbedLindsay, Jerrit Horen A 03/03/2017 11:30 AM

## 2017-03-03 NOTE — Tx Team (Signed)
Interdisciplinary Treatment and Diagnostic Plan Update  03/03/2017 Time of Session: 0930 Alexandra Buckley MRN: [id number]  Principal Diagnosis: MDD severe   Secondary Diagnoses: Active Problems:   MDD (major depressive disorder), recurrent severe, without psychosis (HCC)   Current Medications:  Current Facility-Administered Medications  Medication Dose Route Frequency Provider Last Rate Last Dose  . acetaminophen (TYLENOL) tablet 650 mg  650 mg Oral Q4H PRN Withrow, [name], FNP      . alum & mag hydroxide-simeth (MAALOX/MYLANTA) 200-200-20 MG/5ML suspension 30 mL  30 mL Oral Q6H PRN Withrow, [name], FNP      . magnesium hydroxide (MILK OF MAGNESIA) suspension 30 mL  30 mL Oral Daily PRN Withrow, [name], FNP      . ondansetron (ZOFRAN) tablet 4 mg  4 mg Oral Q8H PRN Withrow, [name], FNP       PTA Medications: Prescriptions Prior to Admission  Medication Sig Dispense Refill Last Dose  . acetaminophen (TYLENOL) 500 MG tablet Take 1,500-2,000 mg by mouth every 4 (four) hours as needed for mild pain, moderate pain, fever or headache.   03/01/2017 at Unknown time    Patient Stressors: Marital or family conflict Substance abuse  Patient Strengths: Ability for insight Wellsite geologist fund of knowledge Motivation for treatment/growth Supportive family/friends  Treatment Modalities: Medication Management, Group therapy, Case management,  1 to 1 session with clinician, Psychoeducation, Recreational therapy.   Physician Treatment Plan for Primary Diagnosis: MDD severe   Medication Management: Evaluate patient's response, side effects, and tolerance of medication regimen.  Therapeutic Interventions: 1 to 1 sessions, Unit Group sessions and Medication administration.  Evaluation of Outcomes: Progressing  Physician Treatment Plan for Secondary Diagnosis: Active Problems:   MDD (major depressive disorder), recurrent severe, without psychosis (HCC)  Long Term Goal(s):      Short Term Goals:       Medication Management: Evaluate patient's response, side effects, and tolerance of medication regimen.  Therapeutic Interventions: 1 to 1 sessions, Unit Group sessions and Medication administration.  Evaluation of Outcomes: Progressing   RN Treatment Plan for Primary Diagnosis: MDD severe  Long Term Goal(s): Knowledge of disease and therapeutic regimen to maintain health will improve  Short Term Goals: Ability to remain free from injury will improve, Ability to verbalize feelings will improve and Ability to disclose and discuss suicidal ideas  Medication Management: RN will administer medications as ordered by provider, will assess and evaluate patient's response and provide education to patient for prescribed medication. RN will report any adverse and/or side effects to prescribing provider.  Therapeutic Interventions: 1 on 1 counseling sessions, Psychoeducation, Medication administration, Evaluate responses to treatment, Monitor vital signs and CBGs as ordered, Perform/monitor CIWA, COWS, AIMS and Fall Risk screenings as ordered, Perform wound care treatments as ordered.  Evaluation of Outcomes: Progressing   LCSW Treatment Plan for Primary Diagnosis: MDD severe  Long Term Goal(s): Safe transition to appropriate next level of care at discharge, Engage patient in therapeutic group addressing interpersonal concerns.  Short Term Goals: Engage patient in aftercare planning with referrals and resources, Facilitate patient progression through stages of change regarding substance use diagnoses and concerns and Identify triggers associated with mental health/substance abuse issues  Therapeutic Interventions: Assess for all discharge needs, 1 to 1 time with Social worker, Explore available resources and support systems, Assess for adequacy in community support network, Educate family and significant other(s) on suicide prevention, Complete Psychosocial Assessment,  Interpersonal group therapy.  Evaluation of Outcomes: Progressing   Progress  in Treatment: Attending groups: No. New to unit. Continuing to assess.  Participating in groups: No. Taking medication as prescribed: Yes. Toleration medication: Yes. Family/Significant other contact made: No, will contact:  family member if patient consents Patient understands diagnosis: Yes. Discussing patient identified problems/goals with staff: Yes. Medical problems stabilized or resolved: Yes. Denies suicidal/homicidal ideation: Yes. Issues/concerns per patient self-inventory: No. Other: n/a   New problem(s) identified: No, Describe:  n/a  New Short Term/Long Term Goal(s): elimination of SI thoughts; detox; medication stabilization; development of comprehensive mental wellness/sobriety plan.   Discharge Plan or Barriers: CSW assessing for appropriate referrals. This is patient's first admission.   Reason for Continuation of Hospitalization: Depression Medication stabilization Suicidal ideation Withdrawal symptoms  Estimated Length of Stay: 3-5 days   Attendees: Patient: 03/03/2017 9:11 AM  Physician: Dr. Elna BreslowEappen MD 03/03/2017 9:11 AM  Nursing: Foy Guadalajarahrista; Patrice RN 03/03/2017 9:11 AM  RN Care Manager: Onnie BoerJennifer Clark CM 03/03/2017 9:11 AM  Social Worker: Trula SladeHeather Smart, LCSW 03/03/2017 9:11 AM  Recreational Therapist: x 03/03/2017 9:11 AM  Other: Armandina StammerAgnes Nwoko NP; Gray BernhardtMay Augustin NP 03/03/2017 9:11 AM  Other:  03/03/2017 9:11 AM  Other: 03/03/2017 9:11 AM    Scribe for Treatment Team: Ledell PeoplesHeather N Smart, LCSW 03/03/2017 9:11 AM

## 2017-03-03 NOTE — Progress Notes (Signed)
Admission Note:  46 year old female who presents, in no acute distress, for the treatment of SI following an intentional overdose on cleaning products.  On admission, patient states "It was a stupid thing to do".  Patient appears flat and depressed and verbalizes agitation. Patient was guarded and forwarded little during admission process. Patient presents with passive SI and contracts for safety upon admission. Patient denies AVH. Patient reports increased feelings of depression .  Patient denies drug and alcohol use. UDS positive for cocaine.  Patient refused to discuss recent stressors.  Patient currently lives with husband and identifies husband and daughter as her support systems.  [facility], patient would like "to go home" and to "Continue working so I could succeed in life".  Skin was assessed and found to be clear of any abnormal marks apart from old scars on arms and legs bilateral. Patient searched and no contraband found, POC and unit policies explained and understanding verbalized. Consents obtained. Patient placed on q 15 minute safety checks. Food and fluids offered and accepted. Patient had no additional questions or concerns.

## 2017-03-03 NOTE — Progress Notes (Signed)
Patient ID: Alexandra Buckley, female   DOB: [birth year], 46 y.o.   MRN: [id number]  Patient refused blood draw for HCG follow-up. NP Nwoko notified of this refusal.

## 2017-03-03 NOTE — Tx Team (Signed)
Initial Treatment Plan 03/03/2017 12:38 AM Alexandra Buckley WUJ:[id number]    PATIENT STRESSORS: Marital or family conflict Substance abuse   PATIENT STRENGTHS: Ability for insight Communication skills General fund of knowledge Motivation for treatment/growth Supportive family/friends   PATIENT IDENTIFIED PROBLEMS: At risk for suicide  Substance Abuse  "to go home"  "continue working so I could succeed in life"               DISCHARGE CRITERIA:  Ability to meet basic life and health needs Improved stabilization in mood, thinking, and/or behavior Motivation to continue treatment in a less acute level of care Need for constant or close observation no longer present  PRELIMINARY DISCHARGE PLAN: Attend 12-step recovery group Outpatient therapy Return to previous living arrangement Return to previous work or school arrangements  PATIENT/FAMILY INVOLVEMENT: This treatment plan has been presented to and reviewed with the patient, Alexandra Buckley.  The patient and family have been given the opportunity to ask questions and make suggestions.  Carleene OverlieMiddleton, Rayonna Heldman P, RN 03/03/2017, 12:38 AM

## 2017-03-03 NOTE — Progress Notes (Addendum)
Patient ID: Alexandra Buckley, female   DOB: 02/17/1971, 46 y.o.   MRN: [id number]  DAR: Pt. Denies SI/HI and A/V Hallucinations. She reports sleep is good, appetite is good, energy level is normal, and concentration is good. She rates depression, hopelessness, and anxiety 0/10. Patient does not report any pain or discomfort at this time. Support and encouragement provided to the patient however patient remained minimal. No scheduled or PRN medications administered to patient. Patient is seen in the milieu talking on the phone otherwise patient has kept to herself in her room. Q15 minute checks are maintained for safety.

## 2017-03-03 NOTE — [facility] Suicide Risk Assessment (Signed)
[facility]   Nursing information obtained from:  Patient Demographic factors:  NA Current Mental Status:  Suicidal ideation indicated by patient, Suicide plan, Plan includes specific time, place, or method, Self-harm thoughts, Self-harm behaviors, Intention to act on suicide plan, Belief that plan would result in death Loss Factors:  Loss of significant relationship Historical Factors:  NA Risk Reduction Factors:  Living with another person, especially a relative, Positive social support  Total Time spent with patient: 1 hour Principal Problem: <principal problem not specified> Diagnosis:   Patient Active Problem List   Diagnosis Date Noted  . MDD (major depressive disorder), recurrent severe, without psychosis (HCC) [F33.2] 03/02/2017   Subjective Data: [name] is a 46 years old married, working at [employer] [facility] with overdose. She has minimizes stresses saying that seeking attention from family and children.   Continued Clinical Symptoms:  Alcohol Use Disorder Identification Test Final Score (AUDIT): 2 The "Alcohol Use Disorders Identification Test", Guidelines for Use in Primary Care, Second Edition.  World Science writerHealth Organization [facility](WHO). Score between 0-7:  no or low risk or alcohol related problems. Score between 8-15:  moderate risk of alcohol related problems. Score between 16-19:  high risk of alcohol related problems. Score 20 or above:  warrants further diagnostic evaluation for alcohol dependence and treatment.   CLINICAL FACTORS:   Severe Anxiety and/or Agitation Depression:   Anhedonia Comorbid alcohol abuse/dependence Impulsivity Insomnia Recent sense of peace/wellbeing Severe Alcohol/Substance Abuse/Dependencies Unstable or Poor Therapeutic Relationship   Musculoskeletal:  Psychiatric Specialty Exam: Physical Exam  ROS  Blood pressure (!) 144/78, pulse 82, temperature 98.4 F (36.9 C), temperature  source Oral, resp. rate 17, last menstrual period 02/23/2017.There is no height or weight on file to calculate BMI.  Sleep:  Number of Hours: 5.75      COGNITIVE FEATURES THAT CONTRIBUTE TO RISK:  Closed-mindedness, Loss of executive function and Polarized thinking    SUICIDE RISK:   Moderate:  Frequent suicidal ideation with limited intensity, and duration, some specificity in terms of plans, no associated intent, good self-control, limited dysphoria/symptomatology, some risk factors present, and identifiable protective factors, including available and accessible social support.  PLAN OF CARE: Admit for increased symptoms of depresion and status post suicide attempt.   I certify that inpatient services furnished can reasonably be expected to improve the patient's condition.   Leata MouseJANARDHANA Elo Marmolejos, MD 03/03/2017, 1:00 PM

## 2017-03-04 DIAGNOSIS — F17210 Nicotine dependence, cigarettes, uncomplicated: Secondary | ICD-10-CM

## 2017-03-04 LAB — HCG, QUANTITATIVE, PREGNANCY

## 2017-03-04 MED ORDER — ACETAMINOPHEN 325 MG PO TABS: 650.0000 mg | ORAL_TABLET | Freq: Four times a day (QID) | ORAL | 0 refills | Status: DC | PRN

## 2017-03-04 NOTE — Progress Notes (Signed)
CSW spoke with pt individually regarding aftercare and resources. [facility]able [facility] and was given [facility] [place] information, [facility] pamphlet, [facility] pamphlet/Mobile Crisis information, and [place]. Patient states that she feels safe to discharge and requested work note. She denies SI/HI and rates depression/anxiety as 1/10. Patient presents with pleasant mood/calm affect. CSW spoke with pt's daughter, who plans to visit with pt regularly after discharge and who states that she feels comfortable with pt discharging home today.  Trula SladeHeather Smart, MSW, LCSW Clinical Social Worker 03/04/2017 1:21 PM

## 2017-03-04 NOTE — [facility] Suicide Risk Assessment (Signed)
[facility]   Principal Problem: MDD (major depressive disorder), recurrent severe, without psychosis (HCC)  Patient is a 46 year old female, married, has 6 children, was admitted for an intentional overdose as a suicide attempt. Patient reports that she's had a difficult relationship with her husband, adds that he does not understand her spending time with her children. She states that she's been depressed and anxious for a few months now due to her trying to juggle between her kids and her husband. She adds that she's not had any psychotic symptoms.   Patient reports that she is doing better in regards to her mood, has had a lot of time to think, adds her daughter has been coming every day and is being supportive. She states that she does not want to end her life, does want to see a therapist outpatient, adds that the overdose was a mistake and she wants to be there for her kids. She denies any thoughts of hurting herself or others, any concerns this morning. She also denies any history of physical or sexual abuse, any PTSD symptoms. She also reports that she's been sleeping fine here at the hospital  Discussed in length with patient coping skills, the poor choice patient had made in order to have her husband returned to her, the need for counseling outpatient. Patient agrees it was a poor choice, states that she needs to accept the situation, feels her kids are supportive. Crisis and safety plan was discussed in length with patient due to the severity of her attempt. Patient was tearful while discussing the choice she had made, how bad she felt about it and the need for her to move on in her life in regards to her relationship.   Discharge Diagnoses:  Patient Active Problem List   Diagnosis Date Noted  . MDD (major depressive disorder), recurrent severe, without psychosis (HCC) [F33.2] 03/02/2017    Total Time spent with patient: 30 minutes  Musculoskeletal: Strength  & Muscle Tone: within normal limits Gait & Station: normal Patient leans: N/A  Psychiatric Specialty Exam: Review of Systems  Constitutional: Negative.  Negative for fever and weight loss.  HENT: Negative.  Negative for sinus pain and sore throat.   Eyes: Negative.  Negative for blurred vision, double vision and redness.  Respiratory: Negative.  Negative for cough, shortness of breath and wheezing.   Cardiovascular: Negative.  Negative for chest pain and palpitations.  Gastrointestinal: Negative.  Negative for abdominal pain, constipation, diarrhea, heartburn, nausea and vomiting.  Genitourinary: Negative for dysuria.  Musculoskeletal: Negative.  Negative for myalgias.  Skin: Negative.  Negative for rash.  Neurological: Negative.  Negative for dizziness, seizures, loss of consciousness, weakness and headaches.  Endo/Heme/Allergies: Negative.  Negative for environmental allergies.  Psychiatric/Behavioral: Positive for depression. Negative for hallucinations, memory loss, substance abuse and suicidal ideas. The patient is not nervous/anxious and does not have insomnia.     Blood pressure 133/76, pulse 84, temperature 98.8 F (37.1 C), temperature source Oral, resp. rate 16, last menstrual period 02/23/2017.There is no height or weight on file to calculate BMI.  General Appearance: Casual  Eye Contact::  Good  Speech:  Clear and Coherent and Normal Rate  Volume:  Normal  Mood:  Anxious  Affect:  Congruent, Full Range and Tearful  Thought Process:  Coherent, Goal Directed and Descriptions of Associations: Intact  Orientation:  Full (Time, Place, and Person)  Thought Content:  WDL  Suicidal Thoughts:  No  Homicidal Thoughts:  No  Memory:  Immediate;   Fair Recent;   Fair Remote;   Fair  Judgement:  Impaired  Insight:  Shallow  Psychomotor Activity:  Normal  Concentration:  Fair  Recall:  [employer] of Knowledge:Fair  Language: Fair  Akathisia:  No  Handed:  Right  AIMS (if  indicated):     Assets:  Communication Skills Desire for Improvement Housing Social Support  Sleep:  Number of Hours: 5.75  Cognition: WNL  ADL's:  Intact   Mental Status Per Nursing Assessment::   On Admission:  Suicidal ideation indicated by patient, Suicide plan, Plan includes specific time, place, or method, Self-harm thoughts, Self-harm behaviors, Intention to act on suicide plan, Belief that plan would result in death  Demographic Factors:  recently separated  Loss Factors: NA  Historical Factors: Impulsivity and Recent separation  Risk Reduction Factors:   Sense of responsibility to family, Religious beliefs about death and Positive social support  Continued Clinical Symptoms:  Depression:   Comorbid alcohol abuse/dependence  Cognitive Features That Contribute To Risk:  None    Suicide Risk:  Minimal: No identifiable suicidal ideation.  Patients presenting with no risk factors but with morbid ruminations; may be classified as minimal risk based on the severity of the depressive symptoms  Follow-up Information    [facility] Follow up.   Specialty:  Behavioral Health Why:  Walk in within 7 days of hospital discharge if you would like to be assessed for outpatient mental health services including: Medication management; counseling; support groups. Walk in hours: Monday through Friday 8am-9am. Thank you.  Contact information: [address] [place] [address] [phone]           Plan Of Care/Follow-up recommendations:  Activity:  As tolerated Diet:  Regular Other:  Keep follow-up appointments and take medications as prescribed  Nelly Rout, MD 03/04/2017, 11:56 AM

## 2017-03-04 NOTE — Progress Notes (Signed)
[facility] Group Notes:  (Nursing/MHT/Case Management/Adjunct)  Date:  03/04/2017  Time:  0930 Type of Therapy:  Nurse Education  Participation Level:  Did Not Attend  Participation Quality:    Affect:    Cognitive:    Insight:    Engagement in Group:    Modes of Intervention:    Summary of Progress/Problems:  [name], [name] 03/04/2017, 2:06 PM

## 2017-03-04 NOTE — Tx Team (Signed)
Interdisciplinary Treatment and Diagnostic Plan Update  03/04/2017 Time of Session: 0930 Alexandra Buckley MRN: [id number]  Principal Diagnosis: MDD severe   Secondary Diagnoses: Principal Problem:   MDD (major depressive disorder), recurrent severe, without psychosis ([place])   Current Medications:  Current Facility-Administered Medications  Medication Dose Route Frequency Provider Last Rate Last Dose  . acetaminophen (TYLENOL) tablet 650 mg  650 mg Oral Q4H PRN Withrow, Elyse Jarvis, FNP      . alum & mag hydroxide-simeth (MAALOX/MYLANTA) 200-200-20 MG/5ML suspension 30 mL  30 mL Oral Q6H PRN Withrow, [name], FNP      . magnesium hydroxide (MILK OF MAGNESIA) suspension 30 mL  30 mL Oral Daily PRN Withrow, [name], FNP      . ondansetron (ZOFRAN) tablet 4 mg  4 mg Oral Q8H PRN Withrow, Elyse Jarvis, FNP       PTA Medications: Prescriptions Prior to Admission  Medication Sig Dispense Refill Last Dose  . acetaminophen (TYLENOL) 500 MG tablet Take 1,500-2,000 mg by mouth every 4 (four) hours as needed for mild pain, moderate pain, fever or headache.   03/01/2017 at Unknown time    Patient Stressors: Marital or family conflict Substance abuse  Patient Strengths: Ability for insight Curator fund of knowledge Motivation for treatment/growth Supportive family/friends  Treatment Modalities: Medication Management, Group therapy, Case management,  1 to 1 session with clinician, Psychoeducation, Recreational therapy.   Physician Treatment Plan for Primary Diagnosis: MDD severe   Medication Management: Evaluate patient's response, side effects, and tolerance of medication regimen.  Therapeutic Interventions: 1 to 1 sessions, Unit Group sessions and Medication administration.  Evaluation of Outcomes: Met  Physician Treatment Plan for Secondary Diagnosis: Principal Problem:   MDD (major depressive disorder), recurrent severe, without psychosis ([place])  Long Term Goal(s):  Improvement in symptoms so as ready for discharge Improvement in symptoms so as ready for discharge   Short Term Goals: Ability to identify changes in lifestyle to reduce recurrence of condition will improve Ability to verbalize feelings will improve Ability to disclose and discuss suicidal ideas Ability to demonstrate self-control will improve Ability to identify and develop effective coping behaviors will improve Ability to maintain clinical measurements within normal limits will improve Compliance with prescribed medications will improve Ability to identify triggers associated with substance abuse/mental health issues will improve     Medication Management: Evaluate patient's response, side effects, and tolerance of medication regimen.  Therapeutic Interventions: 1 to 1 sessions, Unit Group sessions and Medication administration.  Evaluation of Outcomes: Met   RN Treatment Plan for Primary Diagnosis: MDD severe  Long Term Goal(s): Knowledge of disease and therapeutic regimen to maintain health will improve  Short Term Goals: Ability to remain free from injury will improve, Ability to verbalize feelings will improve and Ability to disclose and discuss suicidal ideas  Medication Management: RN will administer medications as ordered by provider, will assess and evaluate patient's response and provide education to patient for prescribed medication. RN will report any adverse and/or side effects to prescribing provider.  Therapeutic Interventions: 1 on 1 counseling sessions, Psychoeducation, Medication administration, Evaluate responses to treatment, Monitor vital signs and CBGs as ordered, Perform/monitor CIWA, COWS, AIMS and Fall Risk screenings as ordered, Perform wound care treatments as ordered.  Evaluation of Outcomes: Met   LCSW Treatment Plan for Primary Diagnosis: MDD severe  Long Term Goal(s): Safe transition to appropriate next level of care at discharge, Engage patient in  therapeutic group addressing interpersonal concerns.  Short Term  Goals: Engage patient in aftercare planning with referrals and resources, Facilitate patient progression through stages of change regarding substance use diagnoses and concerns and Identify triggers associated with mental health/substance abuse issues  Therapeutic Interventions: Assess for all discharge needs, 1 to 1 time with Social worker, Explore available resources and support systems, Assess for adequacy in community support network, Educate family and significant other(s) on suicide prevention, Complete Psychosocial Assessment, Interpersonal group therapy.  Evaluation of Outcomes: Met   Progress in Treatment: Attending groups: Yes Participating in groups: Yes Taking medication as prescribed: Yes. Toleration medication: Yes. Family/Significant other contact made: SPE completed with pt's daughter SPE also completed with pt; SPI pamphlet provided  Patient understands diagnosis: Yes. Discussing patient identified problems/goals with staff: Yes. Medical problems stabilized or resolved: Yes. Denies suicidal/homicidal ideation: Yes. Issues/concerns per patient self-inventory: No. Other: n/a   New problem(s) identified: No, Describe:  n/a  New Short Term/Long Term Goal(s): elimination of SI thoughts; detox; medication stabilization; development of comprehensive mental wellness/sobriety plan.   Discharge Plan or Barriers: Pt is returning home; follow-up at [facility] information provided. Pt is anxious to return to work tomorrow in order to keep her job   Reason for Continuation of Hospitalization: none  Estimated Length of Stay: discharge today   Attendees: Patient: 03/04/2017 9:39 AM  Physician: Dr.  Dwyane Dee MD 03/04/2017 9:39 AM  Nursing: Theodis Shove RN 03/04/2017 9:39 AM  RN Care Manager: Lars Pinks CM 03/04/2017 9:39 AM  Social Worker: Maxie Better, LCSW 03/04/2017 9:39 AM   Recreational Therapist: x 03/04/2017 9:39 AM  Other: Lindell Spar NP 03/04/2017 9:39 AM  Other:  03/04/2017 9:39 AM  Other: 03/04/2017 9:39 AM    Scribe for Treatment Team: [place], LCSW 03/04/2017 9:39 AM

## 2017-03-04 NOTE — Discharge Summary (Signed)
Physician Discharge Summary Note  Patient:  Alexandra Buckley is an 46 y.o., female MRN:  [id number] DOB:  1971-02-28 Patient phone:  [phone] (home)  Patient address:   [address] [place] [address],  Total Time spent with patient: 30 minutes  Date of Admission:  03/02/2017 Date of Discharge: 03-04-17  Reason for Admission: Per H&P. Alexandra Gordonis an 46 y.o.female, married, have six children and none lives with her, [facility] as a suicide attempt. Patient states that she is seeking attention from family members and for intention to end her life and she has endorses drinking occasionally but no drug absue  Principal Problem: MDD (major depressive disorder), recurrent severe, without psychosis ([facility])  Discharge Diagnoses: Patient Active Problem List   Diagnosis Date Noted  . MDD (major depressive disorder), recurrent severe, without psychosis ([facility]) [F33.2] 03/02/2017   Past Psychiatric History: MDD  Past Medical History: History reviewed. No pertinent past medical history. History reviewed. No pertinent surgical history. Family History: History reviewed. No pertinent family history. Family Psychiatric  History: See H&P Social History:  History  Alcohol Use No     History  Drug use: Unknown    Social History   Social History  . Marital status: Married    Spouse name: N/A  . Number of children: N/A  . Years of education: N/A   Social History Main Topics  . Smoking status: Current Every Day Smoker    Types: Cigarettes  . Smokeless tobacco: Never Used  . Alcohol use No  . Drug use: Unknown  . Sexual activity: Not Asked   Other Topics Concern  . None   Social History Narrative  . None   Hospital Course: (Per admission assessment): Alexandra CastleValensia Gordonis an 46 y.o.female, married, have six children and none lives with her, [facility] as a suicide attempt. Patient states that she is  seeking attention from family members and for intention to end her life and she has endorses drinking occasionally but no drug absue. She does not want to report separation or divorce but heard from her daughter that her husband is divorcing her. She minimizes her depression, anxiety and suicide or homicide ideation, intention or plan. She can not contract for safety during this evaluation. She denied psychosis, delusion and or paranoia.she becomes emotional, tearful and guilty about her intentional overdose. UDS is positive for cocaine. She denied regular drug abuse. Occasional alcohol drinking.     Physical Findings: AIMS: Facial and Oral Movements Muscles of Facial Expression: None, normal Lips and Perioral Area: None, normal Jaw: None, normal Tongue: None, normal,Extremity Movements Upper (arms, wrists, hands, fingers): None, normal Lower (legs, knees, ankles, toes): None, normal, Trunk Movements Neck, shoulders, hips: None, normal, Overall Severity Severity of abnormal movements (highest score from questions above): None, normal Incapacitation due to abnormal movements: None, normal Patient's awareness of abnormal movements (rate only patient's report): No Awareness, Dental Status Current problems with teeth and/or dentures?: No Does patient usually wear dentures?: No  CIWA:    COWS:     Musculoskeletal: Strength & Muscle Tone: within normal limits Gait & Station: normal Patient leans: N/A  Psychiatric Specialty Exam: Physical Exam  Nursing note and vitals reviewed. Constitutional: She is oriented to person, place, and time. She appears well-developed.  HENT:  Head: Normocephalic.  Eyes: Pupils are equal, round, and reactive to light.  Neck: Normal range of motion.  Cardiovascular: Normal rate and regular rhythm.   Respiratory:  Effort normal and breath sounds normal.  GI: Soft. Bowel sounds are normal.  Genitourinary:  Genitourinary Comments: Deferred  Musculoskeletal:  Normal range of motion.  Neurological: She is alert and oriented to person, place, and time.  Skin: Skin is warm.    Review of Systems  Constitutional: Negative.   HENT: Negative.   Eyes: Negative.   Respiratory: Negative.   Cardiovascular: Negative.   Gastrointestinal: Negative.   Genitourinary: Negative.   Musculoskeletal: Negative.   Skin: Negative.   Neurological: Negative.   Endo/Heme/Allergies: Negative.   Psychiatric/Behavioral: Positive for depression (Stable) and substance abuse (Hx. Cocaine use disorder). Negative for hallucinations, memory loss and suicidal ideas. The patient has insomnia (Stable). The patient is not nervous/anxious.     Blood pressure 133/76, pulse 84, temperature 98.8 F (37.1 C), temperature source Oral, resp. rate 16, last menstrual period 02/23/2017.There is no height or weight on file to calculate BMI.  See Md's SRA   Have you used any form of tobacco in the last 30 days? (Cigarettes, Smokeless Tobacco, Cigars, and/or Pipes): Yes  Has this patient used any form of tobacco in the last 30 days? (Cigarettes, Smokeless Tobacco, Cigars, and/or Pipes) Yes, No  Blood Alcohol level:  Lab Results  Component Value Date   ETH <5 03/02/2017   Metabolic Disorder Labs:  No results found for: HGBA1C, MPG No results found for: PROLACTIN No results found for: CHOL, TRIG, HDL, CHOLHDL, VLDL, LDLCALC  See Psychiatric Specialty Exam and Suicide Risk Assessment completed by Attending Physician prior to discharge.  Discharge destination:  Home  Is patient on multiple antipsychotic therapies at discharge:  No   Has Patient had three or more failed trials of antipsychotic monotherapy by history:  No  Recommended Plan for Multiple Antipsychotic Therapies: NA  Allergies as of 03/04/2017   No Known Allergies     Medication List    TAKE these medications     Indication  acetaminophen 325 MG tablet Commonly known as:  TYLENOL Take 2 tablets (650 mg total) by  mouth every 6 (six) hours as needed for mild pain (temp > 38.3 Celsius). For pain What changed:  medication strength  how much to take  when to take this  reasons to take this  additional instructions  Indication:  Pain      Follow-up Information    [facility] Follow up.   Specialty:  Behavioral Health Why:  Walk in within 7 days of hospital discharge if you would like to be assessed for outpatient mental health services including: Medication management; counseling; support groups. Walk in hours: Monday through Friday 8am-9am. Thank you.  [address] [place] [address] [phone]          Follow-up recommendations:  Activities as tolerated. Diet: As recommended by your primary care provider. Keep all scheduled follow-up appointment as recommended.  Comments: Patient has been instructed to call 911, the crisi hot-line or go to the nearest ED in the event of worsening symptoms.  Signed: Delila Pereyra, NP 03/04/2017, 5:36 PM

## 2017-03-04 NOTE — Progress Notes (Signed)
Pt has been agitated and irritable wanting to leave. Per MD call CM to verify a follow up plan. Called SW and follow up plan arranged and discussed with pt. Pt denies si and hi. All items returned and d/c instructions given.

## 2017-03-04 NOTE — Progress Notes (Signed)
D   Pt keeps to herself and minimizes her situation   She has decided to allow labs which she had previously refused   She did not require medications to sleep tonight  A   Verbal support given  Medications offered   Q 15 min checks R    Pt is safe at present time

## 2017-03-04 NOTE — Progress Notes (Signed)
  [facility] :  Will you be returning to the same living situation after discharge:  Yes,  home At discharge, do you have transportation home?: Yes,  daughter Do you have the ability to pay for your medications: Yes,  mental health  Release of information consent forms completed and submitted to medical records by CSW  Patient to Follow up at: Follow-up Information    [facility] Follow up.   Specialty:  Behavioral Health Why:  Walk in within 7 days of hospital discharge if you would like to be assessed for outpatient mental health services including: Medication management; counseling; support groups. Walk in hours: Monday through Friday 8am-9am. Thank you.  Contact information: [address] [place] [address] [phone]           Next level of care provider has [facility] Link:no  Safety Planning and Suicide Prevention discussed: Yes,  SPE completed with pt and her daugther SPI pamphlet and Mobile Crisis information  Have you used any form of tobacco in the last 30 days? (Cigarettes, Smokeless Tobacco, Cigars, and/or Pipes): Yes  Has patient been referred to the Quitline?: Patient refused referral  Patient has been referred for addiction treatment: Yes  Jamieson Hetland N Smart LCSW 03/04/2017, 9:41 AM

## 2018-11-22 ENCOUNTER — Encounter: Payer: Self-pay | Admitting: Family Medicine

## 2018-11-22 ENCOUNTER — Ambulatory Visit
Admission: EM | Admit: 2018-11-22 | Discharge: 2018-11-22 | Disposition: A | Discharge status: Home / Self Care | Payer: BLUE CROSS/BLUE SHIELD | Attending: Family Medicine | Admitting: Family Medicine

## 2018-11-22 DIAGNOSIS — N760 Acute vaginitis: Secondary | ICD-10-CM

## 2018-11-22 DIAGNOSIS — R05 Cough: Secondary | ICD-10-CM

## 2018-11-22 DIAGNOSIS — R059 Cough, unspecified: Secondary | ICD-10-CM

## 2018-11-22 MED ORDER — FLUCONAZOLE 150 MG PO TABS: 150.0000 mg | ORAL_TABLET | Freq: Once | ORAL | 0 refills | Status: AC

## 2018-11-22 MED ORDER — BENZONATATE 100 MG PO CAPS: 100.0000 mg | ORAL_CAPSULE | Freq: Three times a day (TID) | ORAL | 0 refills | Status: DC | PRN

## 2018-11-22 NOTE — ED Provider Notes (Addendum)
[facility]    CSN: [id number] Arrival date & time: 11/22/18  1638     History   Chief Complaint Chief Complaint  Patient presents with  . Vaginal Discharge    HPI Alexandra Buckley is a 48 y.o. female.   This is a 48 year old woman who makes her initial visit to [facility] the urgent care today.  She is complaining of vaginal discharge and a tooth ache.  She was given amoxicillin following dental extraction  Patient complains of white vaginal discharge and odor with irritation and burning X 2 weeks. Patient began taking an antibiotic just before onset of symptoms.   Patient also would like something for the cough she gets after smoking.  She gets up early in the morning to smoke and goes outside where she has a dry cough.  I urged patient to quit cigarettes     History reviewed. No pertinent past medical history.  Patient Active Problem List   Diagnosis Date Noted  . MDD (major depressive disorder), recurrent severe, without psychosis ([facility]) 03/02/2017    History reviewed. No pertinent surgical history.  OB History   No obstetric history on file.      Home Medications    Prior to Admission medications   Medication Sig Start Date End Date Taking? Authorizing Provider  benzonatate (TESSALON) 100 MG capsule Take 1-2 capsules (100-200 mg total) by mouth 3 (three) times daily as needed for cough. 11/22/18   Elvina Sidle, MD  fluconazole (DIFLUCAN) 150 MG tablet Take 1 tablet (150 mg total) by mouth once for 1 dose. Repeat if needed 11/22/18 11/22/18  Elvina Sidle, MD    Family History No family history on file.  Social History Social History   Tobacco Use  . Smoking status: Current Every Day Smoker    Types: Cigarettes  . Smokeless tobacco: Never Used  Substance Use Topics  . Alcohol use: No  . Drug use: Not on file     Allergies   Patient has no known allergies.   Review of Systems Review of Systems  HENT: Positive for dental problem.     Genitourinary: Positive for vaginal discharge.     Physical Exam Triage Vital Signs ED Triage Vitals  Enc Vitals Group     BP      Pulse      Resp      Temp      Temp src      SpO2      Weight      Height      Head Circumference      Peak Flow      Pain Score      Pain Loc      Pain Edu?      Excl. in GC?    No data found.  Updated Vital Signs BP (!) 157/86 (BP Location: Left Arm)   Temp 98.3 F (36.8 C) (Oral)   Resp 16   SpO2 97%    Physical Exam Vitals signs and nursing note reviewed.  Constitutional:      Appearance: Normal appearance.  HENT:     Head: Normocephalic.     Nose: Nose normal.     Mouth/Throat:     Mouth: Mucous membranes are moist.     Pharynx: Oropharynx is clear.     Comments: Well healed post extraction sites of right upper teeth Eyes:     Conjunctiva/sclera: Conjunctivae normal.  Cardiovascular:     Rate and Rhythm: Normal rate  and regular rhythm.     Heart sounds: Normal heart sounds.  Pulmonary:     Effort: Pulmonary effort is normal.     Breath sounds: Normal breath sounds.  Skin:    General: Skin is warm and dry.  Neurological:     General: No focal deficit present.     Mental Status: She is alert.     Gait: Gait normal.  Psychiatric:        Mood and Affect: Mood normal.        Thought Content: Thought content normal.      UC Treatments / Results  Labs (all labs ordered are listed, but only abnormal results are displayed) Labs Reviewed - No data to display  EKG None  Radiology No results found.  Procedures Procedures (including critical care time)  Medications Ordered in UC Medications - No data to display  Initial Impression / Assessment and Plan / UC Course  I have reviewed the triage vital signs and the nursing notes.  Pertinent labs & imaging results that were available during my care of the patient were reviewed by me and considered in my medical decision making (see chart for details).    Final  Clinical Impressions(s) / UC Diagnoses   Final diagnoses:  Vaginitis and vulvovaginitis  Cough   Discharge Instructions   None    ED Prescriptions    Medication Sig Dispense Auth. Provider   fluconazole (DIFLUCAN) 150 MG tablet Take 1 tablet (150 mg total) by mouth once for 1 dose. Repeat if needed 2 tablet Elvina Sidle, MD   benzonatate (TESSALON) 100 MG capsule Take 1-2 capsules (100-200 mg total) by mouth 3 (three) times daily as needed for cough. 40 capsule Elvina Sidle, MD     Controlled Substance Prescriptions [place] Controlled Substance Registry consulted? Not Applicable   Elvina Sidle, MD 11/22/18 Meda Coffee    Elvina Sidle, MD 11/22/18 [phone]

## 2018-11-22 NOTE — ED Triage Notes (Signed)
Patient complains of white vaginal discharge and odor with irritation and burning X 2 weeks. Patient began taking an antibiotic just before onset of symptoms.

## 2018-12-06 ENCOUNTER — Ambulatory Visit
Admission: EM | Admit: 2018-12-06 | Discharge: 2018-12-06 | Disposition: A | Discharge status: Home / Self Care | Payer: BLUE CROSS/BLUE SHIELD | Attending: Physician Assistant | Admitting: Physician Assistant

## 2018-12-06 DIAGNOSIS — G8929 Other chronic pain: Secondary | ICD-10-CM | POA: Insufficient documentation

## 2018-12-06 DIAGNOSIS — F17210 Nicotine dependence, cigarettes, uncomplicated: Secondary | ICD-10-CM | POA: Diagnosis not present

## 2018-12-06 DIAGNOSIS — Z711 Person with feared health complaint in whom no diagnosis is made: Secondary | ICD-10-CM | POA: Diagnosis not present

## 2018-12-06 DIAGNOSIS — W57XXXA Bitten or stung by nonvenomous insect and other nonvenomous arthropods, initial encounter: Secondary | ICD-10-CM | POA: Diagnosis not present

## 2018-12-06 DIAGNOSIS — M25511 Pain in right shoulder: Secondary | ICD-10-CM | POA: Diagnosis not present

## 2018-12-06 MED ORDER — PREDNISONE 50 MG PO TABS: 50.0000 mg | ORAL_TABLET | Freq: Every day | ORAL | 0 refills | Status: DC

## 2018-12-06 MED ORDER — CETIRIZINE HCL 10 MG PO TABS: 10.0000 mg | ORAL_TABLET | Freq: Every day | ORAL | 0 refills | Status: DC

## 2018-12-06 NOTE — Discharge Instructions (Addendum)
Start prednisone and zyrtec for insect bites. Ice compress to the affected area. Refrain from scratching. Follow up with PCP if symptoms not improving.  Cytology sent, you will be contacted with any positive results that requires further treatment. Refrain from sexual activity for the next 7 days.

## 2018-12-06 NOTE — ED Provider Notes (Signed)
[facility]    CSN: [id number] Arrival date & time: 12/06/18  1203     History   Chief Complaint Chief Complaint  Patient presents with  . Insect Bite    HPI Alexandra Buckley is a 48 y.o. female.   48 year old female comes in for multiple complaints.  Few day history of itching from insect bites. States she saw bugs before being bitten. Now itching and scratching. Denies spreading erythema, warmth, fever, pain. States now with itching throughout her body. Has not taken anything for the symptoms.  Chronic right shoulder pain. States works at [employer] and has painful movement. Denies injury/trauma. Denies swelling, numbness/tingling. Has not taken anything for the symptoms.  Has had vaginal discharge, wants STD testing. Denies abdominal pain, nausea, vomiting. Denies fever, chills, night sweats. She states son's girlfriend would like her to be tested for HSV as the girlfriend has HSV and is accusing her for passing it to her son from kissing his cheek.      History reviewed. No pertinent past medical history.  Patient Active Problem List   Diagnosis Date Noted  . MDD (major depressive disorder), recurrent severe, without psychosis (HCC) 03/02/2017    History reviewed. No pertinent surgical history.  OB History   No obstetric history on file.      Home Medications    Prior to Admission medications   Medication Sig Start Date End Date Taking? Authorizing Provider  benzonatate (TESSALON) 100 MG capsule Take 1-2 capsules (100-200 mg total) by mouth 3 (three) times daily as needed for cough. 11/22/18   Elvina Sidle, MD  cetirizine (ZYRTEC) 10 MG tablet Take 1 tablet (10 mg total) by mouth daily. 12/06/18   Cathie Hoops, Amy V, PA-C  predniSONE (DELTASONE) 50 MG tablet Take 1 tablet (50 mg total) by mouth daily with breakfast. 12/06/18   Belinda Fisher, PA-C    Family History No family history on file.  Social History Social History   Tobacco Use  . Smoking status: Current  Every Day Smoker    Types: Cigarettes  . Smokeless tobacco: Never Used  Substance Use Topics  . Alcohol use: No  . Drug use: Not on file     Allergies   Patient has no known allergies.   Review of Systems Review of Systems  Reason unable to perform ROS: See HPI as above.     Physical Exam Triage Vital Signs ED Triage Vitals [12/06/18 1222]  Enc Vitals Group     BP (!) 150/98     Pulse Rate 87     Resp 18     Temp 97.9 F (36.6 C)     Temp Source Oral     SpO2 98 %     Weight      Height      Head Circumference      Peak Flow      Pain Score 0     Pain Loc      Pain Edu?      Excl. in GC?    No data found.  Updated Vital Signs BP (!) 150/98 (BP Location: Left Arm)   Pulse 87   Temp 97.9 F (36.6 C) (Oral)   Resp 18   LMP 11/18/2018   SpO2 98%   Physical Exam Constitutional:      General: She is not in acute distress.    Appearance: She is well-developed. She is not ill-appearing, toxic-appearing or diaphoretic.  HENT:  Head: Normocephalic and atraumatic.  Eyes:     Conjunctiva/sclera: Conjunctivae normal.     Pupils: Pupils are equal, round, and reactive to light.  Musculoskeletal:     Comments: No swelling, erythema, warmth, contusion seen. No tenderness to palpation of the shoulder. Patient states unable to adduct shoulder past 90 degrees, but was observed to do so later after exam. Has full passive ROM. Strength deferred. Sensation intact and equal bilaterally.  Skin:    General: Skin is warm and dry.     Comments: Scratch marks throughout chest and neck. Surrounding erythema to the scratch marks. No warmth, tenderness to palpation  Neurological:     Mental Status: She is alert and oriented to person, place, and time.      UC Treatments / Results  Labs (all labs ordered are listed, but only abnormal results are displayed) Labs Reviewed  CERVICOVAGINAL ANCILLARY ONLY    EKG None  Radiology No results found.  Procedures Procedures  (including critical care time)  Medications Ordered in UC Medications - No data to display  Initial Impression / Assessment and Plan / UC Course  I have reviewed the triage vital signs and the nursing notes.  Pertinent labs & imaging results that were available during my care of the patient were reviewed by me and considered in my medical decision making (see chart for details).    Prednisone as directed. Zyrtec for itching. Refrain from scratching. cytology sent, patient to avoid sexual activity for the next 7 days. Patient to follow up with PCP for further evaluation needed.   Discussed with patient no indication of HSV testing given patient without outbreak.  Patient requesting work note for 12/04/2018. Discussed with patient cannot back track note given we saw her today. She states "even though you were open and that is why I couldn't be seen?" Provided patient other urgent cares that are open during Sunday for future reference, but still unable to back track note today.  Final Clinical Impressions(s) / UC Diagnoses   Final diagnoses:  Insect bite, multiple  Concern about STD in female without diagnosis    ED Prescriptions    Medication Sig Dispense Auth. Provider   predniSONE (DELTASONE) 50 MG tablet Take 1 tablet (50 mg total) by mouth daily with breakfast. 5 tablet Yu, Amy V, PA-C   cetirizine (ZYRTEC) 10 MG tablet Take 1 tablet (10 mg total) by mouth daily. 15 tablet Threasa Alpha, [place] 12/06/18 1645

## 2018-12-06 NOTE — ED Triage Notes (Signed)
Pt c/o of rt shoulder pain from and injury 10/19

## 2018-12-06 NOTE — ED Triage Notes (Signed)
Pt states had a small brown bug on the back of her neck, now itching all over since Saturday

## 2018-12-08 LAB — CERVICOVAGINAL ANCILLARY ONLY
CHLAMYDIA, DNA PROBE: NEGATIVE
Neisseria Gonorrhea: NEGATIVE
TRICH (WINDOWPATH): NEGATIVE

## 2018-12-12 ENCOUNTER — Ambulatory Visit
Admission: EM | Admit: 2018-12-12 | Discharge: 2018-12-12 | Disposition: A | Discharge status: Home / Self Care | Payer: BLUE CROSS/BLUE SHIELD | Attending: Family Medicine | Admitting: Family Medicine

## 2018-12-12 ENCOUNTER — Encounter: Payer: Self-pay | Admitting: Emergency Medicine

## 2018-12-12 DIAGNOSIS — M25512 Pain in left shoulder: Secondary | ICD-10-CM | POA: Diagnosis not present

## 2018-12-12 DIAGNOSIS — R238 Other skin changes: Secondary | ICD-10-CM

## 2018-12-12 DIAGNOSIS — M25511 Pain in right shoulder: Secondary | ICD-10-CM

## 2018-12-12 MED ORDER — CLOBETASOL PROPIONATE 0.05 % EX SOLN: 1.0000 "application " | Freq: Two times a day (BID) | CUTANEOUS | 0 refills | Status: DC

## 2018-12-12 NOTE — ED Provider Notes (Signed)
[facility]    CSN: [id number] Arrival date & time: 12/12/18  1838     History   Chief Complaint Chief Complaint  Patient presents with  . itchiness    HPI Alexandra Buckley is a 48 y.o. female.   HPI   Patient again here with the complaint of insects biting her and causing a rash that itches.  This time it is her Scalp.  She says she saw the insect. Her hair is falling out.  She says she pulled some of it out.  She has multiple small sores on her body in various stages of healing from what appears to be skin picking.  Is not taking her antihistamines.  "does not like to take pills"   Still has right shoulder pain and is "scared" it is rotator cuff disease.  Has trouble doing hr [employer] job.    History reviewed. No pertinent past medical history.  Patient Active Problem List   Diagnosis Date Noted  . MDD (major depressive disorder), recurrent severe, without psychosis (HCC) 03/02/2017    History reviewed. No pertinent surgical history.  OB History   No obstetric history on file.      Home Medications    Prior to Admission medications   Medication Sig Start Date End Date Taking? Authorizing Provider  cetirizine (ZYRTEC) 10 MG tablet Take 1 tablet (10 mg total) by mouth daily. 12/06/18  Yes Yu, Amy V, PA-C  predniSONE (DELTASONE) 50 MG tablet Take 1 tablet (50 mg total) by mouth daily with breakfast. 12/06/18  Yes Yu, Amy V, PA-C  clobetasol (CORMAX SCALP APPLICATION) 0.05 % external solution Apply 1 application topically 2 (two) times daily. 12/12/18   Eustace Moore, MD    Family History History reviewed. No pertinent family history.  Social History Social History   Tobacco Use  . Smoking status: Current Every Day Smoker    Types: Cigarettes  . Smokeless tobacco: Never Used  Substance Use Topics  . Alcohol use: No  . Drug use: Not on file     Allergies   Patient has no known allergies.   Review of Systems Review of Systems  Constitutional:  Negative for chills and fever.  HENT: Negative for ear pain and sore throat.   Eyes: Negative for pain and visual disturbance.  Respiratory: Negative for cough and shortness of breath.   Cardiovascular: Negative for chest pain and palpitations.  Gastrointestinal: Negative for abdominal pain and vomiting.  Genitourinary: Negative for dysuria and hematuria.  Musculoskeletal: Positive for joint swelling. Negative for arthralgias and back pain.  Skin: Positive for rash. Negative for color change.  Neurological: Negative for seizures and syncope.  All other systems reviewed and are negative.    Physical Exam Triage Vital Signs ED Triage Vitals  Enc Vitals Group     BP 12/12/18 1845 (!) 181/82     Pulse Rate 12/12/18 1845 (!) 106     Resp 12/12/18 1845 18     Temp 12/12/18 1845 98 F (36.7 C)     Temp Source 12/12/18 1845 Oral     SpO2 12/12/18 1845 96 %   No data found.  Updated Vital Signs BP (!) 181/82 (BP Location: Left Arm)   Pulse (!) 106   Temp 98 F (36.7 C) (Oral)   Resp 18   LMP 11/18/2018   SpO2 96%       Physical Exam Constitutional:      General: She is not in acute distress.  Appearance: She is well-developed.  HENT:     Head: Normocephalic and atraumatic.  Eyes:     Conjunctiva/sclera: Conjunctivae normal.     Pupils: Pupils are equal, round, and reactive to light.  Neck:     Musculoskeletal: Normal range of motion.  Cardiovascular:     Rate and Rhythm: Normal rate.  Pulmonary:     Effort: Pulmonary effort is normal. No respiratory distress.  Abdominal:     General: There is no distension.     Palpations: Abdomen is soft.  Musculoskeletal: Normal range of motion.     Comments: Very limited shoulder range of motion, especially with abduction and rotation  Skin:    General: Skin is warm and dry.     Comments: Scalp is well oiled.  Patches of hair loss behind the ears and a few on crown.  Few of these alopecia areas have fine scale  Neurological:       General: No focal deficit present.     Mental Status: She is alert.  Psychiatric:     Comments: talkative      UC Treatments / Results  Labs (all labs ordered are listed, but only abnormal results are displayed) Labs Reviewed - No data to display  EKG None  Radiology No results found.  Procedures Procedures (including critical care time)  Medications Ordered in UC Medications - No data to display  Initial Impression / Assessment and Plan / UC Course  I have reviewed the triage vital signs and the nursing notes.  Pertinent labs & imaging results that were available during my care of the patient were reviewed by me and considered in my medical decision making (see chart for details).      Final Clinical Impressions(s) / UC Diagnoses   Final diagnoses:  Scalp irritation  Pain of both shoulder joints     Discharge Instructions     Use the prescription scalp treatment as directed You may need a dermatologist  See an orthopedic for follow up on your shoulder     ED Prescriptions    Medication Sig Dispense Auth. Provider   clobetasol (CORMAX SCALP APPLICATION) 0.05 % external solution Apply 1 application topically 2 (two) times daily. 50 mL Eustace Moore, MD     Controlled Substance Prescriptions [place] Controlled Substance Registry consulted? Not Applicable   Eustace Moore, MD 12/12/18 Ernestina [place]

## 2018-12-12 NOTE — ED Notes (Signed)
Patient able to ambulate independently  

## 2018-12-12 NOTE — ED Triage Notes (Signed)
Pt presents to [facility] for assessment of her scalp itching today.  States she felt a bug, but could not see one.  Pt states she treated her hair with castro oil and indian hemp with a stocking cap and wig last night.

## 2018-12-12 NOTE — Discharge Instructions (Signed)
Use the prescription scalp treatment as directed You may need a dermatologist  See an orthopedic for follow up on your shoulder

## 2019-02-13 ENCOUNTER — Encounter (HOSPITAL_COMMUNITY): Payer: Self-pay

## 2019-02-13 ENCOUNTER — Ambulatory Visit (HOSPITAL_COMMUNITY)
Admission: EM | Admit: 2019-02-13 | Discharge: 2019-02-13 | Disposition: A | Discharge status: Home / Self Care | Payer: BLUE CROSS/BLUE SHIELD | Attending: Family Medicine | Admitting: Family Medicine

## 2019-02-13 ENCOUNTER — Other Ambulatory Visit: Payer: Self-pay

## 2019-02-13 DIAGNOSIS — H5789 Other specified disorders of eye and adnexa: Secondary | ICD-10-CM

## 2019-02-13 MED ORDER — FLUTICASONE PROPIONATE 50 MCG/ACT NA SUSP: 1.0000 | Freq: Every day | NASAL | 2 refills | Status: DC

## 2019-02-13 MED ORDER — CETIRIZINE HCL 10 MG PO TABS: 10.0000 mg | ORAL_TABLET | Freq: Every day | ORAL | 11 refills | Status: DC

## 2019-02-13 NOTE — ED Provider Notes (Signed)
[facility]    CSN: [id number] Arrival date & time: 02/13/19  1853     History   Chief Complaint Chief Complaint  Patient presents with  . Facial Swelling    HPI Alexandra Buckley is a 48 y.o. female.   She is presenting with eye irritation in each eye.  This occurs intermittently.  She denies any exposure to an irritant.  No trauma to the eye.  She does have blurriness but does not wear her glasses on a regular basis.  She does like her symptoms of been mild.  Denies any other swelling or throat irritation.  Has not tried anything for the symptoms.  HPI  History reviewed. No pertinent past medical history.  Patient Active Problem List   Diagnosis Date Noted  . MDD (major depressive disorder), recurrent severe, without psychosis (HCC) 03/02/2017    History reviewed. No pertinent surgical history.  OB History   No obstetric history on file.      Home Medications    Prior to Admission medications   Medication Sig Start Date End Date Taking? Authorizing Provider  cetirizine (ZYRTEC) 10 MG tablet Take 1 tablet (10 mg total) by mouth daily. 02/13/19   Myra Rude, MD  clobetasol (CORMAX SCALP APPLICATION) 0.05 % external solution Apply 1 application topically 2 (two) times daily. 12/12/18   Eustace Moore, MD  fluticasone (FLONASE) 50 MCG/ACT nasal spray Place 1 spray into both nostrils daily. 02/13/19   Myra Rude, MD  predniSONE (DELTASONE) 50 MG tablet Take 1 tablet (50 mg total) by mouth daily with breakfast. 12/06/18   Belinda Fisher, PA-C    Family History History reviewed. No pertinent family history.  Social History Social History   Tobacco Use  . Smoking status: Current Every Day Smoker    Types: Cigarettes  . Smokeless tobacco: Never Used  Substance Use Topics  . Alcohol use: No  . Drug use: Not on file     Allergies   Patient has no known allergies.   Review of Systems Review of Systems  Constitutional: Negative for fever.   HENT: Negative for congestion.   Eyes: Negative for discharge.  Respiratory: Negative for cough.   Cardiovascular: Negative for chest pain.  Gastrointestinal: Negative for abdominal pain.  Musculoskeletal: Negative for back pain.  Skin: Negative for color change.  Neurological: Negative for weakness.  Hematological: Negative for adenopathy.     Physical Exam Triage Vital Signs ED Triage Vitals  Enc Vitals Group     BP 02/13/19 1905 (!) 142/91     Pulse Rate 02/13/19 1905 93     Resp 02/13/19 1905 17     Temp 02/13/19 1905 97.7 F (36.5 C)     Temp Source 02/13/19 1905 Oral     SpO2 02/13/19 1905 100 %     Weight --      Height --      Head Circumference --      Peak Flow --      Pain Score 02/13/19 1904 0     Pain Loc --      Pain Edu? --      Excl. in GC? --    No data found.  Updated Vital Signs BP (!) 142/91 (BP Location: Right Arm)   Pulse 93   Temp 97.7 F (36.5 C) (Oral)   Resp 17   SpO2 100%   Visual Acuity Right Eye Distance:   Left Eye Distance:   Bilateral Distance:  Right Eye Near:   Left Eye Near:    Bilateral Near:     Physical Exam Gen: NAD, alert, cooperative with exam, well-appearing ENT: normal lips, normal nasal mucosa,  Eye: normal EOM, normal conjunctiva and lids, no TTP around the orbit, normal pupil reaction.  CV:  no edema, +2 pedal pulses   Resp: no accessory muscle use, non-labored,  Skin: no rashes, no areas of induration  Neuro: normal tone, normal sensation to touch Psych:  normal insight, alert and oriented MSK: normal gait, normal strength   UC Treatments / Results  Labs (all labs ordered are listed, but only abnormal results are displayed) Labs Reviewed - No data to display  EKG None  Radiology No results found.  Procedures Procedures (including critical care time)  Medications Ordered in UC Medications - No data to display  Initial Impression / Assessment and Plan / UC Course  I have reviewed the triage  vital signs and the nursing notes.  Pertinent labs & imaging results that were available during my care of the patient were reviewed by me and considered in my medical decision making (see chart for details).     Alexandra Buckley is a 48 yo F that is presenting with eye irritation. Possible for an allergy component. Does not wear her glasses on on a regular basis. No trauma or debris in her eye. Provided flonase and zyrtec. Counseled on supportive care. Provided note to return to work. Given indications to return.   Final Clinical Impressions(s) / UC Diagnoses   Final diagnoses:  Eye irritation     Discharge Instructions     Please try the flonase and zyrtec  Please try to wear your glasses if you get blurry eyes  Please follow up if your symptoms fail to improve.     ED Prescriptions    Medication Sig Dispense Auth. Provider   fluticasone (FLONASE) 50 MCG/ACT nasal spray Place 1 spray into both nostrils daily. 16 g Myra RudeSchmitz, Ajanay Farve E, MD   cetirizine (ZYRTEC) 10 MG tablet Take 1 tablet (10 mg total) by mouth daily. 30 tablet Myra RudeSchmitz, Jep Dyas E, MD     Controlled Substance Prescriptions [place] Controlled Substance Registry consulted? Not Applicable   Myra RudeSchmitz, Quindarius Cabello E, MD 02/13/19 Serena [id number]

## 2019-02-13 NOTE — ED Triage Notes (Signed)
Patient presents to Urgent Care with complaints of left eye swelling when she wakes up since 3-4 days ago. Patient reports the swelling goes down throughout the day. Pt's supervisor asked pt to be cleared medically to return to work. Pt states she recently purchased a different clothes detergent.

## 2019-02-13 NOTE — Discharge Instructions (Addendum)
Please try the flonase and zyrtec  Please try to wear your glasses if you get blurry eyes  Please follow up if your symptoms fail to improve.

## 2019-02-13 NOTE — ED Notes (Signed)
Patient verbalizes understanding of discharge instructions. Opportunity for questioning and answers were provided. Patient discharged from [facility] by MD. 

## 2019-02-15 ENCOUNTER — Ambulatory Visit
Admission: EM | Admit: 2019-02-15 | Discharge: 2019-02-15 | Disposition: A | Discharge status: Home / Self Care | Payer: BLUE CROSS/BLUE SHIELD | Attending: Physician Assistant | Admitting: Physician Assistant

## 2019-02-15 DIAGNOSIS — K644 Residual hemorrhoidal skin tags: Secondary | ICD-10-CM

## 2019-02-15 MED ORDER — HYDROCORTISONE ACETATE 25 MG RE SUPP: 25.0000 mg | Freq: Two times a day (BID) | RECTAL | 0 refills | Status: DC

## 2019-02-15 MED ORDER — POLYETHYLENE GLYCOL 3350 17 G PO PACK: 17.0000 g | PACK | Freq: Every day | ORAL | 0 refills | Status: DC

## 2019-02-15 NOTE — ED Provider Notes (Signed)
[facility]    CSN: [id number] Arrival date & time: 02/15/19  1504     History   Chief Complaint Chief Complaint  Patient presents with  . Hemorrhoids    HPI Alexandra Buckley is a 48 y.o. female.   48 year old female comes in for possible hemorrhoids causing bright red blood during bowel movement.  Patient states she works at [employer] and requires heavy lifting.  She had a particularly heavy load yesterday, and thinks he may have caused the problem.  She noticed bright red blood with her bowel movement last night, denies pain.  Denies diarrhea. Denies melena. Does have some hard stools. Denies abdominal pain, nausea, vomiting. Denies weakness, dizziness, syncope. Denies vaginal bleeding. Has not tried anything for the symptoms.      History reviewed. No pertinent past medical history.  Patient Active Problem List   Diagnosis Date Noted  . MDD (major depressive disorder), recurrent severe, without psychosis (HCC) 03/02/2017    History reviewed. No pertinent surgical history.  OB History   No obstetric history on file.      Home Medications    Prior to Admission medications   Medication Sig Start Date End Date Taking? Authorizing Provider  hydrocortisone (ANUSOL-HC) 25 MG suppository Place 1 suppository (25 mg total) rectally 2 (two) times daily. 02/15/19   Cathie HoopsYu, Nohealani Medinger V, PA-C  polyethylene glycol (MIRALAX) 17 g packet Take 17 g by mouth daily. 02/15/19   Belinda FisherYu, Kyi Romanello V, PA-C    Family History No family history on file.  Social History Social History   Tobacco Use  . Smoking status: Current Every Day Smoker    Types: Cigarettes  . Smokeless tobacco: Never Used  Substance Use Topics  . Alcohol use: No  . Drug use: Not on file     Allergies   Patient has no known allergies.   Review of Systems Review of Systems  Reason unable to perform ROS: See HPI as above.     Physical Exam Triage Vital Signs ED Triage Vitals  Enc Vitals Group     BP 02/15/19  1513 (!) 165/94     Pulse Rate 02/15/19 1513 81     Resp 02/15/19 1513 18     Temp 02/15/19 1513 98.1 F (36.7 C)     Temp Source 02/15/19 1513 Oral     SpO2 02/15/19 1513 96 %     Weight --      Height --      Head Circumference --      Peak Flow --      Pain Score 02/15/19 1514 6     Pain Loc --      Pain Edu? --      Excl. in GC? --    No data found.  Updated Vital Signs BP (!) 165/94 (BP Location: Left Arm)   Pulse 81   Temp 98.1 F (36.7 C) (Oral)   Resp 18   SpO2 96%   Physical Exam Exam conducted with a chaperone present.  Constitutional:      General: She is not in acute distress.    Appearance: She is well-developed. She is not diaphoretic.  HENT:     Head: Normocephalic and atraumatic.  Eyes:     Conjunctiva/sclera: Conjunctivae normal.     Pupils: Pupils are equal, round, and reactive to light.  Genitourinary:    Comments: 3 external hemorrhoids, nonthrombosed. Mild tenderness to palpation. Mild tenderness to DRE, no obvious internal hemorrhoids.  Neurological:  Mental Status: She is alert and oriented to person, place, and time.      UC Treatments / Results  Labs (all labs ordered are listed, but only abnormal results are displayed) Labs Reviewed - No data to display  EKG None  Radiology No results found.  Procedures Procedures (including critical care time)  Medications Ordered in UC Medications - No data to display  Initial Impression / Assessment and Plan / UC Course  I have reviewed the triage vital signs and the nursing notes.  Pertinent labs & imaging results that were available during my care of the patient were reviewed by me and considered in my medical decision making (see chart for details).    Start anusol as directed. Discussed possibility of internal hemorrhoids given painless blood with bowel movement. Patient 48 years old without history of colonoscopy, discussed will need further evaluation with GI to rule out other  causes of BRB per rectum such as colon cancer. Return precautions given.  Final Clinical Impressions(s) / UC Diagnoses   Final diagnoses:  External hemorrhoids   ED Prescriptions    Medication Sig Dispense Auth. Provider   hydrocortisone (ANUSOL-HC) 25 MG suppository Place 1 suppository (25 mg total) rectally 2 (two) times daily. 12 suppository Lani Havlik V, PA-C   polyethylene glycol (MIRALAX) 17 g packet Take 17 g by mouth daily. [address] each Threasa Alpha, PA-C 02/15/19 1558

## 2019-02-15 NOTE — Discharge Instructions (Signed)
Start anusol as needed. Miralax daily to prevent constipation. As discussed, follow up with GI for further evaluation.

## 2019-02-15 NOTE — ED Triage Notes (Signed)
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 States she had BRB after having a BM.

## 2019-06-05 ENCOUNTER — Ambulatory Visit
Admission: EM | Admit: 2019-06-05 | Discharge: 2019-06-05 | Disposition: A | Discharge status: Home / Self Care | Payer: Worker's Compensation | Attending: Physician Assistant | Admitting: Physician Assistant

## 2019-06-05 ENCOUNTER — Ambulatory Visit (INDEPENDENT_AMBULATORY_CARE_PROVIDER_SITE_OTHER): Payer: Worker's Compensation

## 2019-06-05 DIAGNOSIS — M79641 Pain in right hand: Secondary | ICD-10-CM

## 2019-06-05 MED ORDER — MELOXICAM 7.5 MG PO TABS: 7.5000 mg | ORAL_TABLET | Freq: Every day | ORAL | 0 refills | Status: DC

## 2019-06-05 NOTE — Discharge Instructions (Signed)
X-ray negative for fracture or dislocation.  Start Mobic. Do not take ibuprofen (motrin/advil)/ naproxen (aleve) while on mobic.  Ice compress daily to help with swelling, Ace wrap during activity.  Follow-up with orthopedic if symptoms not improving.

## 2019-06-05 NOTE — ED Triage Notes (Signed)
Pt states a 46lb box fell on her rt hand at work 82mins ago. Swelling and bruising noted

## 2019-06-05 NOTE — ED Provider Notes (Signed)
[facility]    CSN: [id number] Arrival date & time: 06/05/19  1834      History   Chief Complaint Chief Complaint  Patient presents with  . Hand Pain    HPI Alexandra Buckley is a 48 y.o. female.   48 year old female comes in for right hand pain/injury shortly prior to arrival.  States works at [employer], and a box fell onto her right dorsal hand.  She has swelling to the dorsal hand.  Has limited range of motion of wrist and fingers due to pain.  Denies numbness, tingling.  Has not taken anything for the symptoms.     History reviewed. No pertinent past medical history.  Patient Active Problem List   Diagnosis Date Noted  . MDD (major depressive disorder), recurrent severe, without psychosis ([place]) 03/02/2017    History reviewed. No pertinent surgical history.  OB History   No obstetric history on file.      Home Medications    Prior to Admission medications   Medication Sig Start Date End Date Taking? Authorizing Provider  meloxicam (MOBIC) 7.5 MG tablet Take 1 tablet (7.5 mg total) by mouth daily. 06/05/19   Ok Edwards, PA-C    Family History No family history on file.  Social History Social History   Tobacco Use  . Smoking status: Current Every Day Smoker    Types: Cigarettes  . Smokeless tobacco: Never Used  Substance Use Topics  . Alcohol use: No  . Drug use: Not on file     Allergies   Patient has no known allergies.   Review of Systems Review of Systems  Reason unable to perform ROS: See HPI as above.     Physical Exam Triage Vital Signs ED Triage Vitals  Enc Vitals Group     BP 06/05/19 1835 (!) 166/91     Pulse Rate 06/05/19 1835 83     Resp 06/05/19 1835 18     Temp 06/05/19 1835 98.4 F (36.9 C)     Temp Source 06/05/19 1835 Oral     SpO2 06/05/19 1839 96 %     Weight --      Height --      Head Circumference --      Peak Flow --      Pain Score 06/05/19 1836 7     Pain Loc --      Pain Edu? --      Excl. in [place]? --     No data found.  Updated Vital Signs BP (!) 166/91 (BP Location: Left Arm)   Pulse 83   Temp 98.4 F (36.9 C) (Oral)   Resp 18   SpO2 96%   Physical Exam Constitutional:      General: She is not in acute distress.    Appearance: She is well-developed. She is not diaphoretic.  HENT:     Head: Normocephalic and atraumatic.  Eyes:     Conjunctiva/sclera: Conjunctivae normal.     Pupils: Pupils are equal, round, and reactive to light.  Pulmonary:     Effort: Pulmonary effort is normal. No respiratory distress.  Musculoskeletal:     Comments: Limited exam due to cooperation.  Swelling with contusion to the right dorsal hand, specifically to the mid third and fourth MCP.  She has diffuse tenderness to palpation along 3rd-4th MCP.  No tenderness to palpation of the wrist or fingers.  Patient unwilling to perform range of motion of the wrist and fingers, stating it  can cause pain to affected area.  NVI  Neurological:     Mental Status: She is alert and oriented to person, place, and time.      UC Treatments / Results  Labs (all labs ordered are listed, but only abnormal results are displayed) Labs Reviewed - No data to display  EKG   Radiology Dg Hand Complete Right  Result Date: 06/05/2019 CLINICAL DATA:  Injury, swelling, contusion. Decreased range of motion. EXAM: RIGHT HAND - COMPLETE 3+ VIEW COMPARISON:  None. FINDINGS: There is no acute displaced fracture or dislocation. There is some soft tissue swelling about the dorsal aspect of the hand. There is no radiopaque foreign body. IMPRESSION: No acute displaced fracture or dislocation. Electronically Signed   By: Katherine Mantlehristopher  Green M.D.   On: 06/05/2019 19:17    Procedures Procedures (including critical care time)  Medications Ordered in UC Medications - No data to display  Initial Impression / Assessment and Plan / UC Course  I have reviewed the triage vital signs and the nursing notes.  Pertinent labs & imaging  results that were available during my care of the patient were reviewed by me and considered in my medical decision making (see chart for details).    X-ray negative for fracture or dislocation.  NSAIDs, ice compress, Ace wrap during activity.  Return precautions given.  Patient expresses understanding and agrees to plan.  Final Clinical Impressions(s) / UC Diagnoses   Final diagnoses:  Right hand pain   ED Prescriptions    Medication Sig Dispense Auth. Provider   meloxicam (MOBIC) 7.5 MG tablet Take 1 tablet (7.5 mg total) by mouth daily. 10 tablet Threasa AlphaYu, Amy V, PA-C       Yu, Amy V, [place] 06/05/19 1931

## 2019-06-21 ENCOUNTER — Encounter: Payer: Self-pay | Admitting: Emergency Medicine

## 2019-06-21 ENCOUNTER — Ambulatory Visit
Admission: EM | Admit: 2019-06-21 | Discharge: 2019-06-21 | Disposition: A | Discharge status: Home / Self Care | Payer: BC Managed Care – PPO | Attending: Physician Assistant | Admitting: Physician Assistant

## 2019-06-21 ENCOUNTER — Other Ambulatory Visit: Payer: Self-pay

## 2019-06-21 DIAGNOSIS — N760 Acute vaginitis: Secondary | ICD-10-CM

## 2019-06-21 DIAGNOSIS — B9689 Other specified bacterial agents as the cause of diseases classified elsewhere: Secondary | ICD-10-CM

## 2019-06-21 DIAGNOSIS — M25531 Pain in right wrist: Secondary | ICD-10-CM | POA: Diagnosis not present

## 2019-06-21 MED ORDER — METRONIDAZOLE 500 MG PO TABS: 500.0000 mg | ORAL_TABLET | Freq: Two times a day (BID) | ORAL | 0 refills | Status: DC

## 2019-06-21 MED ORDER — FLUCONAZOLE 150 MG PO TABS: 150.0000 mg | ORAL_TABLET | Freq: Every day | ORAL | 0 refills | Status: DC

## 2019-06-21 NOTE — ED Triage Notes (Signed)
Pt presents to [facility] for assessment of continued tenderness to her right hand/wrist.  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  Patient never received a note to go back to full duty from occupational health or anyone of that nature.

## 2019-06-21 NOTE — ED Notes (Signed)
Patient able to ambulate independently  

## 2019-06-21 NOTE — ED Provider Notes (Signed)
[facility]    CSN: [id number] Arrival date & time: 06/21/19  1806      History   Chief Complaint Chief Complaint  Patient presents with   Wrist Pain    HPI Alexandra Buckley is a 48 y.o. female.   48 year old female comes in for follow-up of right wrist pain after injury 06/05/2019.  At that time, she had negative x-ray and was treated with Mobic and respond.  She has had significant relief, and has pain intermittently during movement.  However, work requires heavy lifting, and once returning to full duty, has had increased pain to the right wrist, especially during heavy lifting.  She was told by employer to follow-up for reevaluation.  She denies any swelling, numbness, tingling.  Patient also complaining of few day history of vaginal discharge with spotting.  Discharge with odor.  Denies vaginal itching, pain.  Denies abdominal pain, nausea, vomiting.  Denies fever, chills, body aches.  Denies urinary symptoms such as frequency, dysuria, hematuria.  Sexually active with one female partner, no condom use, no worries for STDs.     History reviewed. No pertinent past medical history.  Patient Active Problem List   Diagnosis Date Noted   MDD (major depressive disorder), recurrent severe, without psychosis ([place]) 03/02/2017    History reviewed. No pertinent surgical history.  OB History   No obstetric history on file.      Home Medications    Prior to Admission medications   Medication Sig Start Date End Date Taking? Authorizing Provider  fluconazole (DIFLUCAN) 150 MG tablet Take 1 tablet (150 mg total) by mouth daily. Take second dose 72 hours later if symptoms still persists. 06/21/19   Tasia Catchings, Lazar Tierce V, PA-C  metroNIDAZOLE (FLAGYL) 500 MG tablet Take 1 tablet (500 mg total) by mouth 2 (two) times daily. 06/21/19   Ok Edwards, PA-C    Family History History reviewed. No pertinent family history.  Social History Social History   Tobacco Use   Smoking status:  Current Every Day Smoker    Types: Cigarettes   Smokeless tobacco: Never Used  Substance Use Topics   Alcohol use: No   Drug use: Not on file     Allergies   Patient has no known allergies.   Review of Systems Review of Systems  Reason unable to perform ROS: See HPI as above.     Physical Exam Triage Vital Signs ED Triage Vitals [06/21/19 1821]  Enc Vitals Group     BP (!) 169/96     Pulse Rate 81     Resp 16     Temp 98.5 F (36.9 C)     Temp Source Oral     SpO2 98 %     Weight      Height      Head Circumference      Peak Flow      Pain Score 5     Pain Loc      Pain Edu?      Excl. in [place]?    No data found.  Updated Vital Signs BP (!) 169/96 (BP Location: Left Arm)    Pulse 81    Temp 98.5 F (36.9 C) (Oral)    Resp 16    SpO2 98%   Visual Acuity Right Eye Distance:   Left Eye Distance:   Bilateral Distance:    Right Eye Near:   Left Eye Near:    Bilateral Near:     Physical Exam  Constitutional:      General: She is not in acute distress.    Appearance: She is well-developed. She is not ill-appearing, toxic-appearing or diaphoretic.  HENT:     Head: Normocephalic and atraumatic.  Eyes:     Conjunctiva/sclera: Conjunctivae normal.     Pupils: Pupils are equal, round, and reactive to light.  Cardiovascular:     Rate and Rhythm: Normal rate and regular rhythm.     Heart sounds: Normal heart sounds. No murmur. No friction rub. No gallop.   Pulmonary:     Effort: Pulmonary effort is normal.     Breath sounds: Normal breath sounds. No wheezing or rales.  Abdominal:     General: Bowel sounds are normal.     Palpations: Abdomen is soft.     Tenderness: There is no abdominal tenderness. There is no right CVA tenderness, left CVA tenderness, guarding or rebound.  Musculoskeletal:     Comments: No swelling, erythema, contusion seen to the right wrist.  Mild tenderness to palpation to the volar wrist.  No tenderness to palpation of the hand.  Full  range of motion of wrist and fingers.  Strength normal and equal bilaterally.  Sensation intact and equal bilaterally.  Radial pulse 2+, cap refill less than 2 seconds.  Negative Tinel's, Phalen's, Finkelstein's.  Skin:    General: Skin is warm and dry.  Neurological:     Mental Status: She is alert and oriented to person, place, and time.  Psychiatric:        Behavior: Behavior normal.        Judgment: Judgment normal.      UC Treatments / Results  Labs (all labs ordered are listed, but only abnormal results are displayed) Labs Reviewed - No data to display  EKG   Radiology No results found.  Procedures Procedures (including critical care time)  Medications Ordered in UC Medications - No data to display  Initial Impression / Assessment and Plan / UC Course  I have reviewed the triage vital signs and the nursing notes.  Pertinent labs & imaging results that were available during my care of the patient were reviewed by me and considered in my medical decision making (see chart for details).    Will have patient continue NSAIDs as needed, wear wrist splint during activity.  Will provide 5-day of light duty, and to follow-up with sports medicine for further evaluation of continues with symptoms.  We will treat for BV with Flagyl.  Patient declined STD testing today.  Return precautions given.  Patient expresses understanding and agrees to plan.  Final Clinical Impressions(s) / UC Diagnoses   Final diagnoses:  Right wrist pain  Vaginitis and vulvovaginitis   ED Prescriptions    Medication Sig Dispense Auth. Provider   metroNIDAZOLE (FLAGYL) 500 MG tablet Take 1 tablet (500 mg total) by mouth 2 (two) times daily. 14 tablet Johonna Binette V, PA-C   fluconazole (DIFLUCAN) 150 MG tablet Take 1 tablet (150 mg total) by mouth daily. Take second dose 72 hours later if symptoms still persists. 2 tablet Threasa AlphaYu, Phallon Haydu V, PA-C        Ozro Russett V, [place]PA-C 06/21/19 [phone]

## 2019-06-21 NOTE — Discharge Instructions (Signed)
You can take ibuprofen if needed for right wrist pain. Otherwise, light duty with wrist splint on. Follow up with sports medicine if symptoms not improving.   You were treated empirically for BV. Start flagyl as directed. You can take diflucan to prevent yeast infection. Refrain from sexual activity and alcohol use for the next 7 days. Monitor for any worsening of symptoms, fever, abdominal pain, nausea, vomiting, to follow up for reevaluation.

## 2019-06-22 ENCOUNTER — Telehealth: Payer: Self-pay

## 2019-07-19 ENCOUNTER — Ambulatory Visit: Payer: Self-pay

## 2019-07-19 ENCOUNTER — Other Ambulatory Visit: Payer: Self-pay | Admitting: Family Medicine

## 2019-07-19 ENCOUNTER — Other Ambulatory Visit: Payer: Self-pay

## 2019-07-19 DIAGNOSIS — M25531 Pain in right wrist: Secondary | ICD-10-CM

## 2019-09-25 ENCOUNTER — Ambulatory Visit
Admission: EM | Admit: 2019-09-25 | Discharge: 2019-09-25 | Disposition: A | Discharge status: Home / Self Care | Payer: BC Managed Care – PPO | Attending: Physician Assistant | Admitting: Physician Assistant

## 2019-09-25 DIAGNOSIS — J069 Acute upper respiratory infection, unspecified: Secondary | ICD-10-CM | POA: Diagnosis not present

## 2019-09-25 DIAGNOSIS — Z20822 Contact with and (suspected) exposure to covid-19: Secondary | ICD-10-CM

## 2019-09-25 DIAGNOSIS — Z20828 Contact with and (suspected) exposure to other viral communicable diseases: Secondary | ICD-10-CM | POA: Diagnosis not present

## 2019-09-25 MED ORDER — BENZONATATE 100 MG PO CAPS: 100.0000 mg | ORAL_CAPSULE | Freq: Three times a day (TID) | ORAL | 0 refills | Status: DC

## 2019-09-25 NOTE — ED Triage Notes (Signed)
Pt c/o cough, nausea,. Bodyaches, loss of smell, and SOB 12/13. States has a positive COVID exposure on  12/14. States at her job they are making them work with positive COVID.

## 2019-09-25 NOTE — ED Provider Notes (Signed)
[facility]    CSN: [id number] Arrival date & time: 09/25/19  1846      History   Chief Complaint Chief Complaint  Patient presents with  . Cough    HPI Alexandra Buckley is a 48 y.o. female.   48 year old female  comes in for 7 day of URI symptoms. Has had cough, nausea, body aches, loss of smell, shortness of breath. States feels like she has to pause when she talks at times. Has felt like she needed to stay on the cough. Denies fever, chills, body aches. Denies abdominal pain, vomiting, diarrhea. Positive COVID contact. Current every day smoker.     History reviewed. No pertinent past medical history.  Patient Active Problem List   Diagnosis Date Noted  . MDD (major depressive disorder), recurrent severe, without psychosis ([facility]) 03/02/2017    History reviewed. No pertinent surgical history.  OB History   No obstetric history on file.      Home Medications    Prior to Admission medications   Medication Sig Start Date End Date Taking? Authorizing Provider  benzonatate (TESSALON) 100 MG capsule Take 1 capsule (100 mg total) by mouth every 8 (eight) hours. 09/25/19   Belinda Fisher, PA-C    Family History History reviewed. No pertinent family history.  Social History Social History   Tobacco Use  . Smoking status: Current Every Day Smoker    Types: Cigarettes  . Smokeless tobacco: Never Used  Substance Use Topics  . Alcohol use: No  . Drug use: Not on file     Allergies   Patient has no known allergies.   Review of Systems Review of Systems  Reason unable to perform ROS: See HPI as above.     Physical Exam Triage Vital Signs ED Triage Vitals  Enc Vitals Group     BP 09/25/19 1917 (!) 151/94     Pulse Rate 09/25/19 1917 70     Resp 09/25/19 1917 18     Temp 09/25/19 1917 98.3 F (36.8 C)     Temp Source 09/25/19 1917 Oral     SpO2 09/25/19 1917 97 %     Weight --      Height --      Head Circumference --      Peak Flow --    Pain Score 09/25/19 1918 0     Pain Loc --      Pain Edu? --      Excl. in [place]? --    No data found.  Updated Vital Signs BP (!) 151/94 (BP Location: Left Arm)   Pulse 70   Temp 98.3 F (36.8 C) (Oral)   Resp 18   SpO2 97%   Physical Exam Constitutional:      General: She is not in acute distress.    Appearance: Normal appearance. She is not ill-appearing, toxic-appearing or diaphoretic.  HENT:     Head: Normocephalic and atraumatic.     Mouth/Throat:     Mouth: Mucous membranes are moist.     Pharynx: Oropharynx is clear. Uvula midline.  Cardiovascular:     Rate and Rhythm: Normal rate and regular rhythm.     Heart sounds: Normal heart sounds. No murmur. No friction rub. No gallop.   Pulmonary:     Effort: Pulmonary effort is normal. No accessory muscle usage, prolonged expiration, respiratory distress or retractions.     Comments: Speaking in full sentences without difficulty. Lungs clear to auscultation without adventitious  lung sounds. Musculoskeletal:     Cervical back: Normal range of motion and neck supple.  Skin:    General: Skin is warm and dry.  Neurological:     General: No focal deficit present.     Mental Status: She is alert and oriented to person, place, and time.    UC Treatments / Results  Labs (all labs ordered are listed, but only abnormal results are displayed) Labs Reviewed  NOVEL CORONAVIRUS, NAA    EKG   Radiology No results found.  Procedures Procedures (including critical care time)  Medications Ordered in UC Medications - No data to display  Initial Impression / Assessment and Plan / UC Course  I have reviewed the triage vital signs and the nursing notes.  Pertinent labs & imaging results that were available during my care of the patient were reviewed by me and considered in my medical decision making (see chart for details).    COVID PCR test ordered. Patient to quarantine until testing results return. No alarming signs on  exam.  Patient speaking in full sentences without respiratory distress.  Symptomatic treatment discussed.  Push fluids.  Return precautions given.  Patient expresses understanding and agrees to plan.  Final Clinical Impressions(s) / UC Diagnoses   Final diagnoses:  Acute URI  Close exposure to COVID-19 virus   ED Prescriptions    Medication Sig Dispense Auth. Provider   benzonatate (TESSALON) 100 MG capsule Take 1 capsule (100 mg total) by mouth every 8 (eight) hours. 21 capsule Ok Edwards, PA-C     PDMP not reviewed this encounter.   Ok Edwards, PA-C 09/25/19 1940

## 2019-09-25 NOTE — Discharge Instructions (Signed)
COVID PCR testing ordered. I would like you to quarantine until testing results. Tessalon for cough. You can take over the counter flonase/nasacort to help with nasal congestion/drainage. If experiencing shortness of breath, trouble breathing, go to the emergency department for further evaluation needed.

## 2019-09-28 LAB — NOVEL CORONAVIRUS, NAA: SARS-CoV-2, NAA: NOT DETECTED

## 2020-03-27 ENCOUNTER — Ambulatory Visit
Admission: EM | Admit: 2020-03-27 | Discharge: 2020-03-27 | Disposition: A | Discharge status: Home / Self Care | Payer: Self-pay | Attending: Emergency Medicine | Admitting: Emergency Medicine

## 2020-03-27 DIAGNOSIS — K0889 Other specified disorders of teeth and supporting structures: Secondary | ICD-10-CM

## 2020-03-27 DIAGNOSIS — S161XXA Strain of muscle, fascia and tendon at neck level, initial encounter: Secondary | ICD-10-CM

## 2020-03-27 MED ORDER — NAPROXEN 500 MG PO TABS: 500.0000 mg | ORAL_TABLET | Freq: Two times a day (BID) | ORAL | 0 refills | Status: DC

## 2020-03-27 MED ORDER — CYCLOBENZAPRINE HCL 5 MG PO TABS: 5.0000 mg | ORAL_TABLET | Freq: Two times a day (BID) | ORAL | 0 refills | Status: AC | PRN

## 2020-03-27 MED ORDER — LIDOCAINE VISCOUS HCL 2 % MT SOLN: 15.0000 mL | OROMUCOSAL | 0 refills | Status: DC | PRN
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
## 2020-03-27 NOTE — Discharge Instructions (Addendum)
Recommend RICE: rest, ice, compression, elevation as needed for pain.    Heat therapy (hot compress, warm wash rag, hot showers, etc.) can help relax muscles and soothe muscle aches. Cold therapy (ice packs) can be used to help swelling both after injury and after prolonged use of areas of chronic pain/aches.  For pain: take naproxen 2 times daily.  May take muscle relaxer as needed for severe pain / spasm.  (This medication may cause you to become tired so it is important you do not drink alcohol or operate heavy machinery while on this medication.  Recommend your first dose to be taken before bedtime to monitor for side effects safely)  [facility]:  [facility] Address: [address], [place], [place], [address] Phone: [phone]  - Dr. Lawrence Marseilles Address: [address] [place], [place], [address] Phone: [phone]
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
## 2021-03-18 ENCOUNTER — Telehealth: Payer: Self-pay | Admitting: Emergency Medicine

## 2021-03-18 DIAGNOSIS — M549 Dorsalgia, unspecified: Secondary | ICD-10-CM

## 2021-03-18 NOTE — Progress Notes (Signed)
I'm sorry, it doesn't sound like an e-visit is the appropriate type of visit for this complaint.  If your symptoms are as severe as you say, you may need advanced imaging, blood work, or urine testing done.  I'm sorry, I won't be able to adequately assess or treat your condition through an e-visit.  Please go to an urgent care listed below or go to an ER for further evaluation.   NOTE: If you entered your credit card information for this eVisit, you will not be charged. You may see a "hold" on your card for the $35 but that hold will drop off and you will not have a charge processed.   If you are having a true medical emergency please call 911.      For an urgent face to face visit, [facility] has six urgent care centers for your convenience:     [facility] at [facility] Directions [phone] [address] [place], [place] [address] 8 am - 4 pm Monday - Friday    [facility]) Get Driving Directions [phone] [address] [place], [place] [address] 8 am to 8 pm Monday-Friday 10 am to 6 pm [facility] Urgent Care Center [facility]) Get Driving Directions [phone]  [address] [place],  [place]  [address] 8 am to 8 pm Monday-Friday 8 am to 4 pm [facility] Urgent Care at [facility] Get Driving Directions [phone] [address] [place], [place] [address] 8 am to 8 pm Monday-Friday 8 am to 4 pm [facility] at [facility] Get Driving Directions  [phone] [address] [place], [place] [address] 8 am to 8 pm Monday-Friday 8 am to 4 pm [facility] Urgent Care at [facility] Directions [phone] [address] [place], [place] [address] 8 am to 8 pm Monday-Friday 8 am to 4 pm Saturday-Sunday     Your  MyChart E-visit questionnaire answers were reviewed by a board certified advanced clinical practitioner to complete your personal care plan based on your specific symptoms.  Thank you for using e-Visits.   Approximately 5 minutes was used in reviewing the patient's chart, questionnaire, prescribing medications, and documentation.

## 2021-07-20 IMAGING — DX DG HAND COMPLETE 3+V*R*
3 series · 3 of 3 positions shown · non-contrast
Comparison: None.

CLINICAL DATA: Injury, swelling, contusion. Decreased range of
motion.

EXAM:
RIGHT HAND - COMPLETE 3+ VIEW

[hand pa]
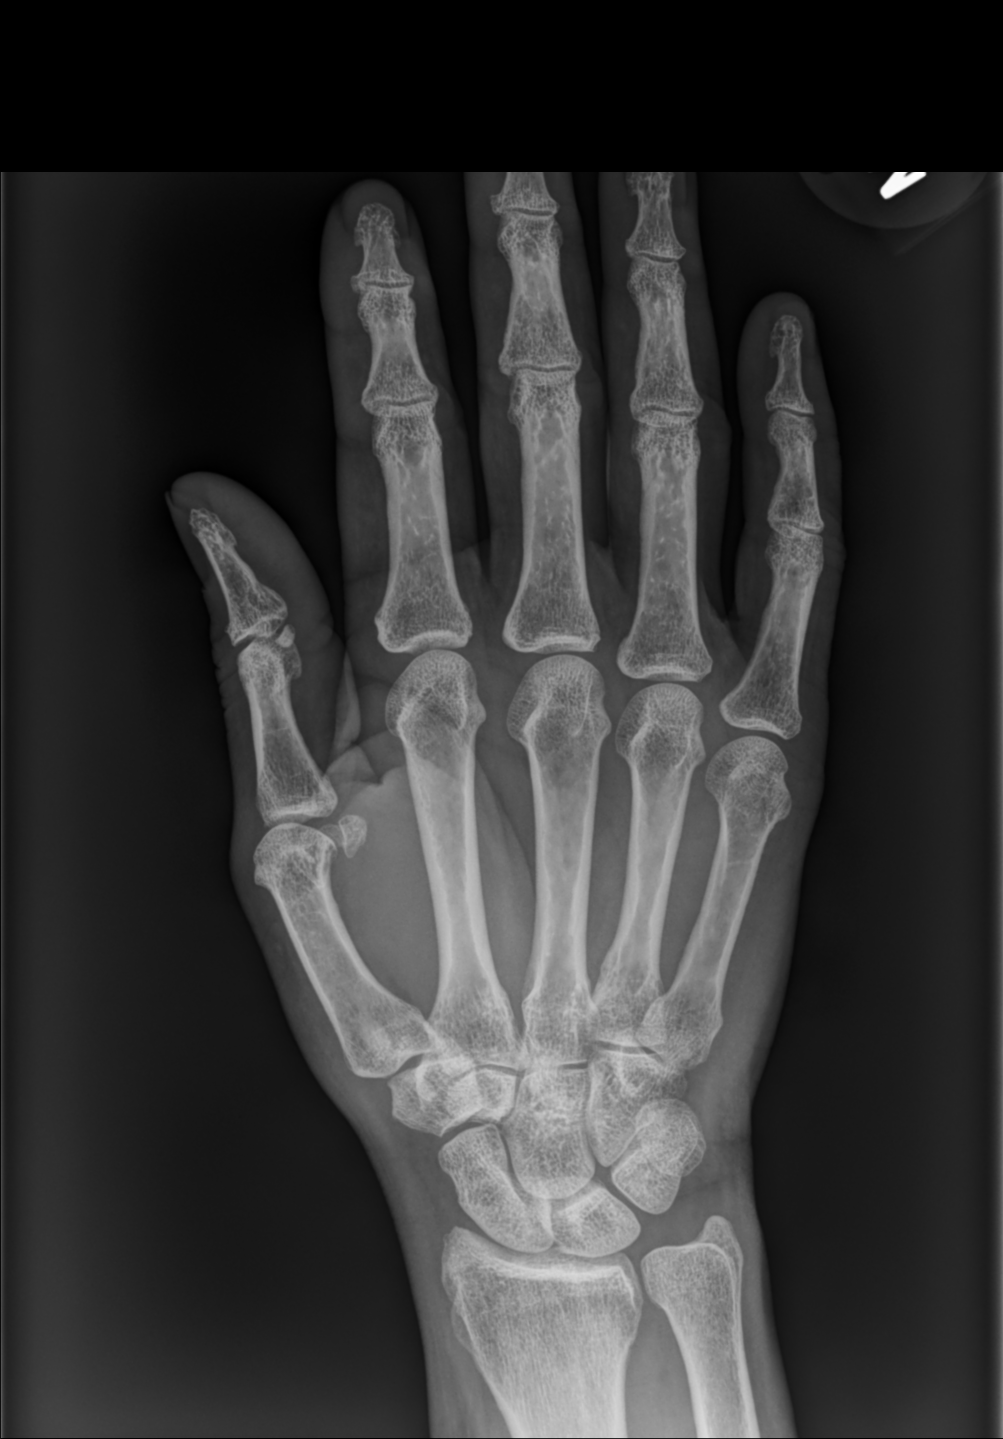

[hand mlo]
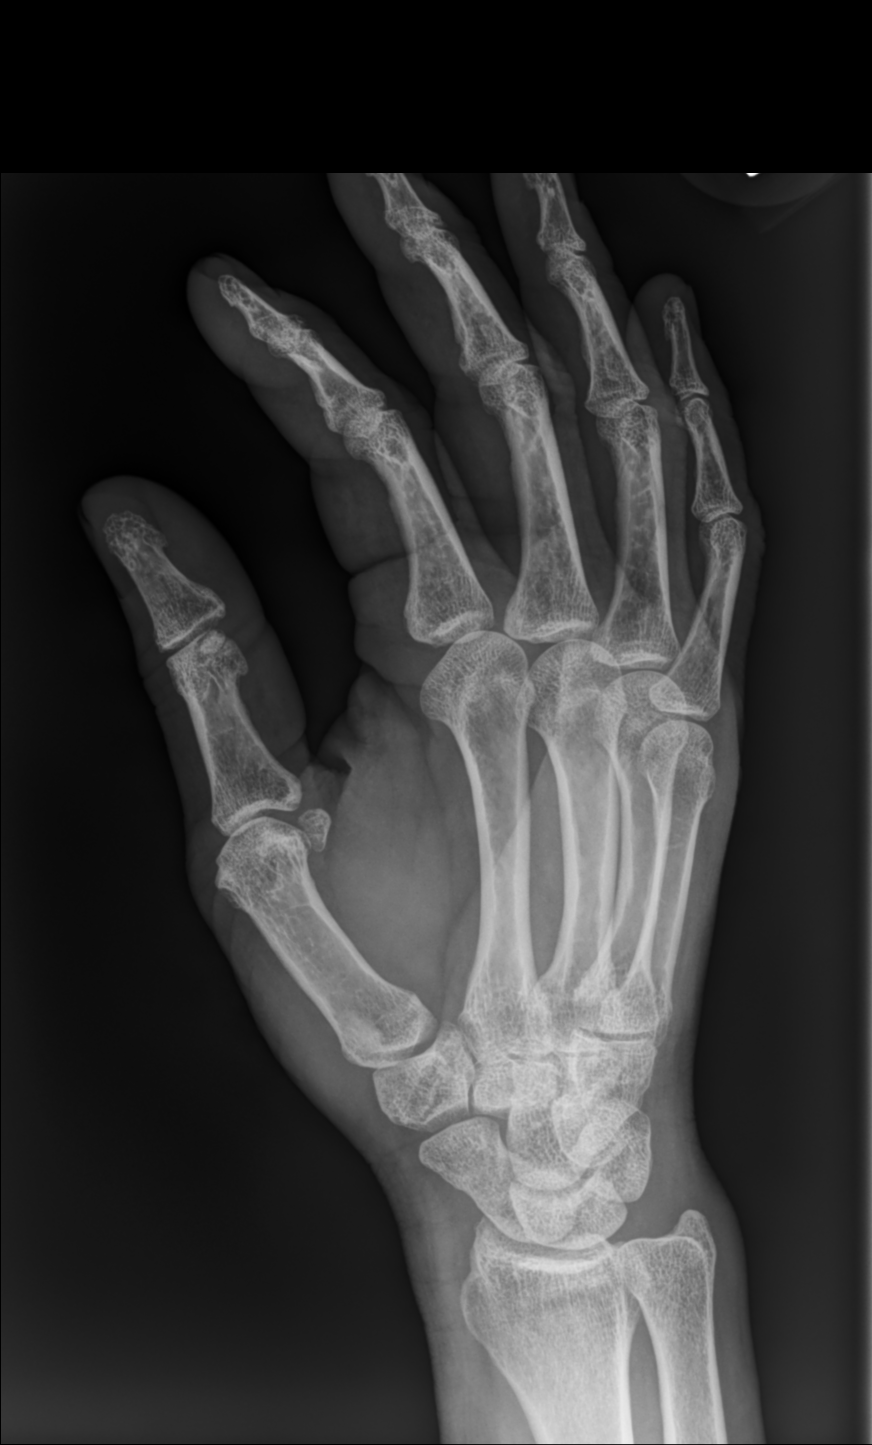

[hand lat]
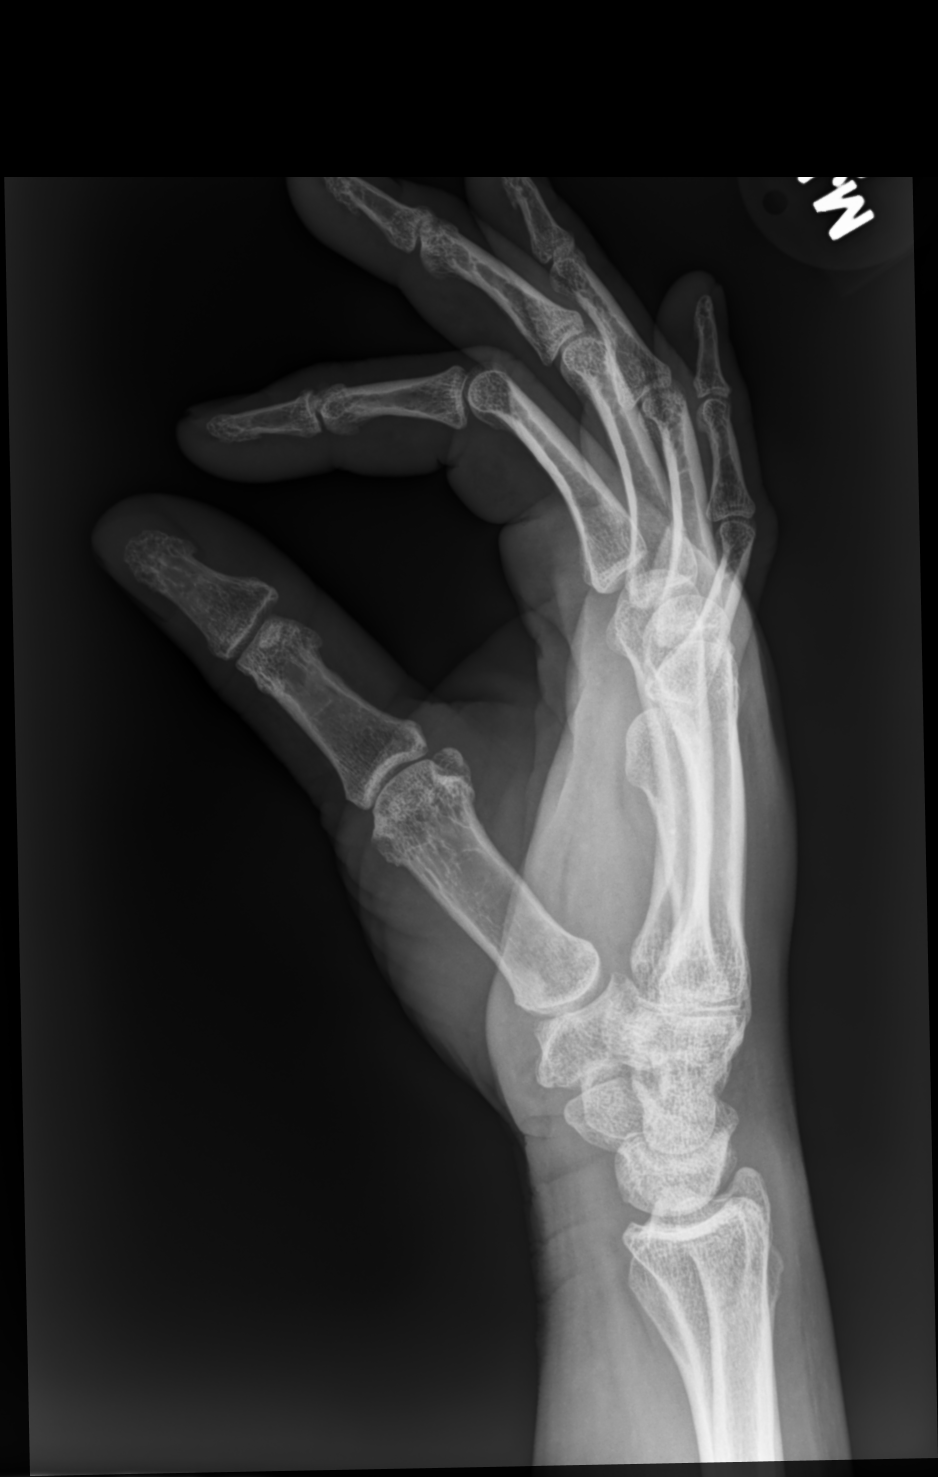

[3 of 3 positions shown; findings below may reference images not displayed]

FINDINGS: There is no acute displaced fracture or dislocation. There is some
soft tissue swelling about the dorsal aspect of the hand. There is
no radiopaque foreign body.
IMPRESSION: No acute displaced fracture or dislocation.

## 2021-10-20 ENCOUNTER — Other Ambulatory Visit: Payer: Self-pay

## 2021-10-20 ENCOUNTER — Ambulatory Visit
Admission: EM | Admit: 2021-10-20 | Discharge: 2021-10-20 | Disposition: A | Discharge status: Home / Self Care | Payer: Self-pay | Attending: Physician Assistant | Admitting: Physician Assistant

## 2021-10-20 ENCOUNTER — Encounter: Payer: Self-pay | Admitting: Emergency Medicine

## 2021-10-20 DIAGNOSIS — N898 Other specified noninflammatory disorders of vagina: Secondary | ICD-10-CM | POA: Insufficient documentation

## 2021-10-20 DIAGNOSIS — T162XXA Foreign body in left ear, initial encounter: Secondary | ICD-10-CM | POA: Insufficient documentation

## 2021-10-20 NOTE — ED Triage Notes (Signed)
Provider triaged.  

## 2021-10-21 NOTE — ED Provider Notes (Signed)
[facility]    CSN: [id number] Arrival date & time: 10/20/21  1921      History   Chief Complaint Chief Complaint  Patient presents with   Foreign Body in Ear    HPI Alexandra Buckley is a 51 y.o. female.   Patient here today with multiple complaints.  Initially she is concerned about what she suspect is the tip of the Q-tip in her left ear.  She states that she believes this occurred about 3 days ago.  She denies any pain.  She has not had any fever.  She has not tried to remove foreign body.  She also reports some brownish colored vaginal discharge.  She does not report any other concerning symptoms.  The history is provided by the patient.  Foreign Body in Ear Pertinent negatives include no abdominal pain and no shortness of breath.   History reviewed. No pertinent past medical history.  Patient Active Problem List   Diagnosis Date Noted   MDD (major depressive disorder), recurrent severe, without psychosis ([facility]) 03/02/2017    History reviewed. No pertinent surgical history.  OB History   No obstetric history on file.      Home Medications    Prior to Admission medications   Medication Sig Start Date End Date Taking? Authorizing Provider  lidocaine (XYLOCAINE) 2 % solution Use as directed 15 mLs in the mouth or throat as needed for mouth pain. 03/27/20   Hall-Potvin, [place], PA-C  naproxen (NAPROSYN) 500 MG tablet Take 1 tablet (500 mg total) by mouth 2 (two) times daily. 03/27/20   Hall-Potvin, [place], PA-C    Family History History reviewed. No pertinent family history.  Social History Social History   Tobacco Use   Smoking status: Every Day    Types: Cigarettes   Smokeless tobacco: Never  Vaping Use   Vaping Use: Never used  Substance Use Topics   Alcohol use: No     Allergies   Patient has no known allergies.   Review of Systems Review of Systems  Constitutional:  Negative for chills and fever.  HENT:  Negative for  congestion and ear pain.   Eyes:  Negative for discharge and redness.  Respiratory:  Negative for cough and shortness of breath.   Gastrointestinal:  Negative for abdominal pain, nausea and vomiting.  Genitourinary:  Positive for vaginal discharge.    Physical Exam Triage Vital Signs ED Triage Vitals  Enc Vitals Group     BP 10/20/21 1933 (!) 165/95     Pulse Rate 10/20/21 1933 98     Resp --      Temp 10/20/21 1933 98.3 F (36.8 C)     Temp Source 10/20/21 1933 Oral     SpO2 10/20/21 1933 96 %     Weight --      Height --      Head Circumference --      Peak Flow --      Pain Score 10/20/21 1942 0     Pain Loc --      Pain Edu? --      Excl. in [place]? --    No data found.  Updated Vital Signs BP (!) 165/95 (BP Location: Right Arm)    Pulse 98    Temp 98.3 F (36.8 C) (Oral)    LMP 11/18/2018    SpO2 96%      Physical Exam Vitals and nursing note reviewed.  Constitutional:      General: She is  not in acute distress.    Appearance: Normal appearance. She is not ill-appearing.  HENT:     Head: Normocephalic and atraumatic.     Ears:     Comments: White cotton foreign body noted to left EAC, after irrigation removed with forceps without complication Eyes:     Conjunctiva/sclera: Conjunctivae normal.  Cardiovascular:     Rate and Rhythm: Normal rate.  Pulmonary:     Effort: Pulmonary effort is normal.  Neurological:     Mental Status: She is alert.  Psychiatric:        Mood and Affect: Mood normal.        Behavior: Behavior normal.        Thought Content: Thought content normal.     UC Treatments / Results  Labs (all labs ordered are listed, but only abnormal results are displayed) Labs Reviewed  CERVICOVAGINAL ANCILLARY ONLY    EKG   Radiology No results found.  Procedures Procedures (including critical care time)  Medications Ordered in UC Medications - No data to display  Initial Impression / Assessment and Plan / UC Course  I have reviewed  the triage vital signs and the nursing notes.  Pertinent labs & imaging results that were available during my care of the patient were reviewed by me and considered in my medical decision making (see chart for details).   FB removed successfully in office. Will order screening for BV, yeast and other STDs given reported discharge. Will await results for further recommendation.    Final Clinical Impressions(s) / UC Diagnoses   Final diagnoses:  Foreign body of left ear, initial encounter  Vaginal discharge   Discharge Instructions   None    ED Prescriptions   None    PDMP not reviewed this encounter.   Tomi Bamberger, PA-C 10/21/21 1218

## 2021-10-22 ENCOUNTER — Telehealth (HOSPITAL_COMMUNITY): Payer: Self-pay | Admitting: Emergency Medicine

## 2021-10-22 LAB — CERVICOVAGINAL ANCILLARY ONLY
Bacterial Vaginitis (gardnerella): POSITIVE — AB
Candida Glabrata: NEGATIVE
Candida Vaginitis: NEGATIVE
Chlamydia: NEGATIVE
Comment: NEGATIVE
Comment: NEGATIVE
Comment: NEGATIVE
Comment: NEGATIVE
Comment: NEGATIVE
Comment: NORMAL
Neisseria Gonorrhea: NEGATIVE
Trichomonas: NEGATIVE

## 2021-10-22 MED ORDER — METRONIDAZOLE 500 MG PO TABS: 500.0000 mg | ORAL_TABLET | Freq: Two times a day (BID) | ORAL | 0 refills | Status: DC

## 2022-04-08 ENCOUNTER — Encounter (HOSPITAL_COMMUNITY): Payer: Self-pay | Admitting: Emergency Medicine

## 2022-04-08 ENCOUNTER — Emergency Department (HOSPITAL_COMMUNITY)
Admission: EM | Admit: 2022-04-08 | Discharge: 2022-04-08 | Discharge status: Against Medical Advice | Payer: Self-pay | Attending: Physician Assistant | Admitting: Physician Assistant

## 2022-04-08 DIAGNOSIS — M542 Cervicalgia: Secondary | ICD-10-CM | POA: Insufficient documentation

## 2022-04-08 DIAGNOSIS — M549 Dorsalgia, unspecified: Secondary | ICD-10-CM | POA: Insufficient documentation

## 2022-04-08 DIAGNOSIS — Z5321 Procedure and treatment not carried out due to patient leaving prior to being seen by health care provider: Secondary | ICD-10-CM | POA: Insufficient documentation

## 2022-04-08 DIAGNOSIS — K625 Hemorrhage of anus and rectum: Secondary | ICD-10-CM | POA: Insufficient documentation

## 2022-04-08 LAB — COMPREHENSIVE METABOLIC PANEL
ALT: 20 U/L (ref 0–44)
AST: 16 U/L (ref 15–41)
Albumin: 3.9 g/dL (ref 3.5–5.0)
Alkaline Phosphatase: 93 U/L (ref 38–126)
Anion gap: 11 (ref 5–15)
BUN: 12 mg/dL (ref 6–20)
CO2: 24 mmol/L (ref 22–32)
Calcium: 9.3 mg/dL (ref 8.9–10.3)
Chloride: 110 mmol/L (ref 98–111)
Creatinine, Ser: 0.72 mg/dL (ref 0.44–1.00)
GFR, Estimated: >60 mL/min (ref >60)
Glucose, Bld: 88 mg/dL (ref 70–99)
Potassium: 3.3 mmol/L — ABNORMAL LOW (ref 3.5–5.1)
Sodium: 145 mmol/L (ref 135–145)
Total Bilirubin: 0.5 mg/dL (ref 0.3–1.2)
Total Protein: 6.9 g/dL (ref 6.5–8.1)

## 2022-04-08 LAB — TYPE AND SCREEN
ABO/RH(D): O POS
Antibody Screen: NEGATIVE

## 2022-04-08 LAB — CBC WITH DIFFERENTIAL/PLATELET
Abs Immature Granulocytes: 0.01 10*3/uL (ref 0.00–0.07)
Basophils Absolute: 0 10*3/uL (ref 0.0–0.1)
Basophils Relative: 0 %
Eosinophils Absolute: 0.1 10*3/uL (ref 0.0–0.5)
Eosinophils Relative: 1 %
HCT: 39 % (ref 36.0–46.0)
Hemoglobin: 12.1 g/dL (ref 12.0–15.0)
Immature Granulocytes: 0 %
Lymphocytes Relative: 27 %
Lymphs Abs: 2.1 10*3/uL (ref 0.7–4.0)
MCH: 28.1 pg (ref 26.0–34.0)
MCHC: 31 g/dL (ref 30.0–36.0)
MCV: 90.5 fL (ref 80.0–100.0)
Monocytes Absolute: 0.3 10*3/uL (ref 0.1–1.0)
Monocytes Relative: 4 %
Neutro Abs: 5.5 10*3/uL (ref 1.7–7.7)
Neutrophils Relative %: 68 %
Platelets: 255 10*3/uL (ref 150–400)
RBC: 4.31 MIL/uL (ref 3.87–5.11)
RDW: 14.8 % (ref 11.5–15.5)
WBC: 8 10*3/uL (ref 4.0–10.5)
nRBC: 0 % (ref 0.0–0.2)

## 2022-04-08 NOTE — ED Notes (Signed)
Called for vitals no answer °

## 2022-04-08 NOTE — ED Notes (Signed)
Called no answer X2 

## 2022-04-08 NOTE — ED Notes (Signed)
Called no answer x3 
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
## 2022-04-09 ENCOUNTER — Emergency Department (HOSPITAL_COMMUNITY)
Admission: EM | Admit: 2022-04-09 | Discharge: 2022-04-09 | Disposition: A | Discharge status: Home / Self Care | Payer: Self-pay | Attending: Emergency Medicine | Admitting: Emergency Medicine

## 2022-04-09 ENCOUNTER — Emergency Department (HOSPITAL_COMMUNITY): Payer: Self-pay

## 2022-04-09 ENCOUNTER — Encounter (HOSPITAL_COMMUNITY): Payer: Self-pay

## 2022-04-09 DIAGNOSIS — K644 Residual hemorrhoidal skin tags: Secondary | ICD-10-CM | POA: Insufficient documentation

## 2022-04-09 DIAGNOSIS — R030 Elevated blood-pressure reading, without diagnosis of hypertension: Secondary | ICD-10-CM | POA: Insufficient documentation

## 2022-04-09 DIAGNOSIS — K649 Unspecified hemorrhoids: Secondary | ICD-10-CM

## 2022-04-09 DIAGNOSIS — M25512 Pain in left shoulder: Secondary | ICD-10-CM | POA: Insufficient documentation

## 2022-04-09 MED ORDER — METHOCARBAMOL 500 MG PO TABS: 500.0000 mg | ORAL_TABLET | Freq: Three times a day (TID) | ORAL | 0 refills | Status: DC | PRN

## 2022-04-09 MED ORDER — HYDROCORTISONE ACETATE 25 MG RE SUPP: 25.0000 mg | Freq: Two times a day (BID) | RECTAL | 0 refills | Status: DC

## 2022-04-09 MED ORDER — LIDOCAINE 5 % EX PTCH: 1.0000 | MEDICATED_PATCH | Freq: Every day | CUTANEOUS | 0 refills | Status: DC | PRN

## 2022-04-09 NOTE — Discharge Instructions (Addendum)
You were seen in the emergency department today for shoulder pain as well as hemorrhoids.  Your x-ray showed a possible AC joint separation in your shoulder, we have placed you in a sling, please wear this, do try to move the shoulder a little bit to avoid frozen shoulder syndrome.  We are sending you home with Lidoderm patches and Robaxin to help with pain.  - Robaxin- this is the muscle relaxer I have prescribed, this is meant to help with muscle tightness/spasms. Be aware that this medication may make you drowsy therefore the first time you take this it should be at a time you are in an environment where you can rest. Do not drive or operate heavy machinery when taking this medication. Do not drink alcohol or take other sedating medications with this medicine such as narcotics or benzodiazepines.   - Lidoderm patch- Apply 1 patch to your area of most significant pain once per day to help numb/soothe this area. Remove & discard patch within 12 hours of application.   You make take Tylenol per over the counter dosing with these medications.   We have prescribed you new medication(s) today. Discuss the medications prescribed today with your pharmacist as they can have adverse effects and interactions with your other medicines including over the counter and prescribed medications. Seek medical evaluation if you start to experience new or abnormal symptoms after taking one of these medicines, seek care immediately if you start to experience difficulty breathing, feeling of your throat closing, facial swelling, or rash as these could be indications of a more serious allergic reaction  In terms of your hemorrhoids we have prescribed Anusol to use twice daily as needed.  Please follow attached recommendations regarding sitz bath's.  Take MiraLAX per over-the-counter dosing to help as needed if you develop constipation.  Follow-up with general surgery regarding your hemorrhoids and orthopedics  regarding your shoulder injury.  Return to the ER for any new or worsening symptoms including but not limited to new or worsening pain, fever, inability to keep fluids down, dark/tarry stool, dizziness, lightheadedness, passing out, shortness of breath, chest pain, or any other concerns.   Additionally have your blood pressure rechecked by your primary care provider as it was elevated in the ER today.
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
## 2022-08-04 ENCOUNTER — Other Ambulatory Visit (HOSPITAL_COMMUNITY)
Admission: EM | Admit: 2022-08-04 | Discharge: 2022-08-06 | Disposition: A | Discharge status: Other Acute Inpatient Hospital | Payer: No Payment, Other | Attending: Psychiatry | Admitting: Psychiatry

## 2022-08-04 ENCOUNTER — Encounter (HOSPITAL_COMMUNITY): Payer: Self-pay | Admitting: Registered Nurse

## 2022-08-04 DIAGNOSIS — F1414 Cocaine abuse with cocaine-induced mood disorder: Secondary | ICD-10-CM | POA: Insufficient documentation

## 2022-08-04 DIAGNOSIS — Z720 Tobacco use: Secondary | ICD-10-CM | POA: Diagnosis not present

## 2022-08-04 DIAGNOSIS — Z1152 Encounter for screening for COVID-19: Secondary | ICD-10-CM | POA: Insufficient documentation

## 2022-08-04 DIAGNOSIS — F17200 Nicotine dependence, unspecified, uncomplicated: Secondary | ICD-10-CM | POA: Diagnosis present

## 2022-08-04 LAB — POCT URINE DRUG SCREEN - MANUAL ENTRY (I-SCREEN)
POC Amphetamine UR: NOT DETECTED
POC Buprenorphine (BUP): NOT DETECTED
POC Cocaine UR: POSITIVE — AB
POC Marijuana UR: NOT DETECTED
POC Methadone UR: NOT DETECTED
POC Methamphetamine UR: NOT DETECTED
POC Morphine: NOT DETECTED
POC Oxazepam (BZO): NOT DETECTED
POC Oxycodone UR: NOT DETECTED
POC Secobarbital (BAR): NOT DETECTED

## 2022-08-04 LAB — CBC WITH DIFFERENTIAL/PLATELET
Abs Immature Granulocytes: 0.01 10*3/uL (ref 0.00–0.07)
Basophils Absolute: 0 10*3/uL (ref 0.0–0.1)
Basophils Relative: 0 %
Eosinophils Absolute: 0.1 10*3/uL (ref 0.0–0.5)
Eosinophils Relative: 1 %
HCT: 41.4 % (ref 36.0–46.0)
Hemoglobin: 13.3 g/dL (ref 12.0–15.0)
Immature Granulocytes: 0 %
Lymphocytes Relative: 42 %
Lymphs Abs: 2.4 10*3/uL (ref 0.7–4.0)
MCH: 28.8 pg (ref 26.0–34.0)
MCHC: 32.1 g/dL (ref 30.0–36.0)
MCV: 89.6 fL (ref 80.0–100.0)
Monocytes Absolute: 0.3 10*3/uL (ref 0.1–1.0)
Monocytes Relative: 5 %
Neutro Abs: 2.9 10*3/uL (ref 1.7–7.7)
Neutrophils Relative %: 52 %
Platelets: 318 10*3/uL (ref 150–400)
RBC: 4.62 MIL/uL (ref 3.87–5.11)
RDW: 14.5 % (ref 11.5–15.5)
WBC: 5.6 10*3/uL (ref 4.0–10.5)
nRBC: 0 % (ref 0.0–0.2)

## 2022-08-04 LAB — COMPREHENSIVE METABOLIC PANEL
ALT: 13 U/L (ref 0–44)
AST: 14 U/L — ABNORMAL LOW (ref 15–41)
Albumin: 3.7 g/dL (ref 3.5–5.0)
Alkaline Phosphatase: 93 U/L (ref 38–126)
Anion gap: 15 (ref 5–15)
BUN: 5 mg/dL — ABNORMAL LOW (ref 6–20)
CO2: 25 mmol/L (ref 22–32)
Calcium: 9.7 mg/dL (ref 8.9–10.3)
Chloride: 105 mmol/L (ref 98–111)
Creatinine, Ser: 0.64 mg/dL (ref 0.44–1.00)
GFR, Estimated: >60 mL/min (ref >60)
Glucose, Bld: 80 mg/dL (ref 70–99)
Potassium: 3.4 mmol/L — ABNORMAL LOW (ref 3.5–5.1)
Sodium: 145 mmol/L (ref 135–145)
Total Bilirubin: 0.2 mg/dL — ABNORMAL LOW (ref 0.3–1.2)
Total Protein: 7.2 g/dL (ref 6.5–8.1)

## 2022-08-04 LAB — URINALYSIS, ROUTINE W REFLEX MICROSCOPIC
Bacteria, UA: NONE SEEN
Bilirubin Urine: NEGATIVE
Glucose, UA: NEGATIVE mg/dL
Hgb urine dipstick: NEGATIVE
Ketones, ur: NEGATIVE mg/dL
Nitrite: NEGATIVE
Protein, ur: NEGATIVE mg/dL
Specific Gravity, Urine: 1.019 (ref 1.005–1.030)
pH: 6 (ref 5.0–8.0)

## 2022-08-04 LAB — TSH: TSH: 1.559 u[IU]/mL (ref 0.350–4.500)

## 2022-08-04 LAB — POC SARS CORONAVIRUS 2 AG: SARSCOV2ONAVIRUS 2 AG: NEGATIVE

## 2022-08-04 LAB — POCT PREGNANCY, URINE: Preg Test, Ur: NEGATIVE

## 2022-08-04 LAB — RESP PANEL BY RT-PCR (FLU A&B, COVID) ARPGX2
Influenza A by PCR: NEGATIVE
Influenza B by PCR: NEGATIVE
SARS Coronavirus 2 by RT PCR: NEGATIVE

## 2022-08-04 LAB — HEMOGLOBIN A1C
Hgb A1c MFr Bld: 5.5 % (ref 4.8–5.6)
Mean Plasma Glucose: 111.15 mg/dL

## 2022-08-04 LAB — LIPID PANEL
Cholesterol: 182 mg/dL (ref 0–200)
HDL: 55 mg/dL (ref >40)
LDL Cholesterol: 116 mg/dL — ABNORMAL HIGH (ref 0–99)
Total CHOL/HDL Ratio: 3.3 RATIO
Triglycerides: 56 mg/dL (ref <150)
VLDL: 11 mg/dL (ref 0–40)

## 2022-08-04 LAB — PREGNANCY, URINE: Preg Test, Ur: NEGATIVE

## 2022-08-04 LAB — MAGNESIUM: Magnesium: 2.1 mg/dL (ref 1.7–2.4)

## 2022-08-04 LAB — ETHANOL: Alcohol, Ethyl (B): <10 mg/dL (ref <10)

## 2022-08-04 MED ORDER — HYDROXYZINE HCL 25 MG PO TABS: 25.0000 mg | ORAL_TABLET | Freq: Three times a day (TID) | ORAL | Status: DC | PRN

## 2022-08-04 MED ORDER — ALUM & MAG HYDROXIDE-SIMETH 200-200-20 MG/5ML PO SUSP: 30.0000 mL | ORAL | Status: DC | PRN

## 2022-08-04 MED ORDER — ACETAMINOPHEN 325 MG PO TABS: 650.0000 mg | ORAL_TABLET | Freq: Four times a day (QID) | ORAL | Status: DC | PRN

## 2022-08-04 MED ORDER — TRAZODONE HCL 50 MG PO TABS: 50.0000 mg | ORAL_TABLET | Freq: Every evening | ORAL | Status: DC | PRN

## 2022-08-04 MED ORDER — GABAPENTIN 600 MG PO TABS
300.0000 mg | ORAL_TABLET | Freq: Three times a day (TID) | ORAL | Status: DC
  Administered 2022-08-04 – 2022-08-05 (×3): 300 mg via ORAL
  Filled 2022-08-04 (×4): qty 1

## 2022-08-04 MED ORDER — MAGNESIUM HYDROXIDE 400 MG/5ML PO SUSP: 30.0000 mL | Freq: Every day | ORAL | Status: DC | PRN
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
## 2022-08-04 NOTE — ED Notes (Signed)
Pt is in the bed sleeping. Respirations are even and unlabored. No acute distress noted. Will continue to monitor for safety. 

## 2022-08-04 NOTE — ED Provider Notes (Signed)
Facility Based Crisis Admission H&P  Date: 08/04/22 Patient Name: Alexandra Buckley MRN: [id number] Chief Complaint: No chief complaint on file.     Diagnoses:  Final diagnoses:  Cocaine abuse with cocaine-induced mood disorder (HCC)    HPI: Alexandra Buckley, 51 y.o., female patient presents to [facility] as a walk in voluntarily with complaints of cocaine abuse and seeking detox/rehab services  Patient seen face to face by this provider, consulted with Dr. Nelly Rout; and chart reviewed on 08/04/22.  On evaluation Alexandra Buckley reports she was "referred here by [facility] in [facility].  She said I would need to come here first and then could come to rehab program."  Patient states that she is homeless in [place] "I was living with my niece until she moved and now I'm homeless."  Patient states she has only had one psychiatric hospitalization in 2018 after suicide attempt "I mixed a bunch of chemical together and drank them.  I was at [facility] ED and then I was sent to behavioral hospital across the street [facility])."  Patient states she has never been to rehab or had substance abuse services.   States she needs to get her life together.  "I have 6 kids total 4 girls and 2 boys.  The other day I talked to my grandson and he said g-ma I haven't seen you in a long time.  It's been six months since I've seen him and I use to have him ever weekend.  I said to myself last night what good am I to my kids."  Patient then became tearful.  Currently patient denies suicidal/self-harm/homicidal ideation, psychosis, and paranoia.  She states she uses "eight ball" (3.5 grams $ 200 to $ 300) of cocaine daily.  "Sell everything I can get my hands on, trick, and call my kids for money.  Patient states she is unemployed.   During evaluation Alexandra Buckley is sitting in chair with no noted distress.  She is alert/oriented x 4; calm/cooperative, and mood congruent with affect.  She is speaking in a clear tone at moderate  volume, and normal pace; with good eye contact.  Her thought process is coherent, relevant, and there is no indication that she is currently responding to internal/external stimuli or experiencing delusional thought content.  She denies suicidal/self-harm/homicidal ideation, psychosis, and paranoia.  She remained calm throughout assessment and responded to questions appropriately.    Patient admitted to facility base crisis unit for cocaine abuse and cocaine induced mood disorder.  Social work to assist with follow up services for outpatient psychiatric services and substance abuse services       PHQ 2-9:  Flowsheet Row ED from 08/04/2022 in [facility]  Thoughts that you would be better off dead, or of hurting yourself in some way Several days  PHQ-9 Total Score 6       Flowsheet Row ED from 08/04/2022 in [facility] ED from 04/09/2022 in [facility] EMERGENCY DEPARTMENT ED from 04/08/2022 in [facility] EMERGENCY DEPARTMENT  C-SSRS RISK CATEGORY Error: Q3, 4, or 5 should not be populated when Q2 is No No Risk No Risk        Total Time spent with patient: 45 minutes  Musculoskeletal  Strength & Muscle Tone: within normal limits Gait & Station: normal Patient leans: N/A  Psychiatric Specialty Exam  Presentation General Appearance: No data recorded Eye Contact:No data recorded Speech:No data recorded Speech Volume:No data recorded  Handedness:No data recorded  Mood and Affect  Mood:No data recorded Affect:No data recorded  Thought Process  Thought Processes:No data recorded Descriptions of Associations:No data recorded Orientation:No data recorded Thought Content:No data recorded   Hallucinations:No data recorded Ideas of Reference:No data recorded Suicidal Thoughts:No data recorded Homicidal Thoughts:No data recorded  Sensorium  Memory:No data recorded Judgment:No data  recorded Insight:No data recorded  Executive Functions  Concentration:No data recorded Attention Span:No data recorded Recall:No data recorded Fund of Knowledge:No data recorded Language:No data recorded  Psychomotor Activity  Psychomotor Activity:No data recorded  Assets  Assets:No data recorded  Sleep  Sleep:No data recorded  No data recorded  Physical Exam Vitals and nursing note reviewed. Exam conducted with a chaperone present.  Constitutional:      General: She is not in acute distress.    Appearance: Normal appearance. She is not ill-appearing.  Cardiovascular:     Rate and Rhythm: Normal rate.  Pulmonary:     Effort: Pulmonary effort is normal.  Musculoskeletal:        General: Normal range of motion.     Cervical back: Normal range of motion.  Skin:    General: Skin is warm and dry.  Neurological:     Mental Status: She is alert and oriented to person, place, and time.  Psychiatric:        Attention and Perception: Attention and perception normal. She does not perceive auditory or visual hallucinations.        Mood and Affect: Depressed: dysphoric.        Speech: Speech normal.        Behavior: Behavior normal. Behavior is cooperative.        Thought Content: Thought content normal. Thought content is not paranoid or delusional. Thought content does not include homicidal or suicidal ideation.        Cognition and Memory: Cognition normal.        Judgment: Judgment normal.    Review of Systems  Constitutional: Negative.        Denies any medical issues or diagnosis  HENT: Negative.    Eyes: Negative.   Respiratory: Negative.    Cardiovascular: Negative.   Gastrointestinal: Negative.   Genitourinary: Negative.   Musculoskeletal: Negative.   Skin: Negative.   Neurological: Negative.   Endo/Heme/Allergies: Negative.   Psychiatric/Behavioral:  Positive for depression. Negative for memory loss. Hallucinations: Denies. Substance abuse: Cocaine daily.  Suicidal ideas: Denies.The patient does not have insomnia. Nervous/anxious: Stable.    Blood pressure (!) 165/87, pulse 75, temperature 98.2 F (36.8 C), temperature source Oral, resp. rate 18, height 5\' 2"  (1.575 m), weight 151 lb (68.5 kg), last menstrual period 11/18/2018, SpO2 100 %. Body mass index is 27.62 kg/m.  Past Psychiatric History: Prior suicide attempt, major depression, cocaine abuse   Is the patient at risk to self? No  Has the patient been a risk to self in the past 6 months? No .    Has the patient been a risk to self within the distant past? No   Is the patient a risk to others? No   Has the patient been a risk to others in the past 6 months? No   Has the patient been a risk to others within the distant past? No   Past Medical History: History reviewed. No pertinent past medical history. History reviewed. No pertinent surgical history.  Family History: History reviewed. No pertinent family history.  Social History:  Social History   Socioeconomic History  Marital status: Legally Separated    Spouse name: Not on file   Number of children: Not on file   Years of education: Not on file   Highest education level: Not on file  Occupational History   Not on file  Tobacco Use   Smoking status: Every Day    Types: Cigarettes   Smokeless tobacco: Never  Vaping Use   Vaping Use: Never used  Substance and Sexual Activity   Alcohol use: No   Drug use: Not on file   Sexual activity: Not on file  Other Topics Concern   Not on file  Social History Narrative   Not on file   Social Determinants of Health   Financial Resource Strain: Not on file  Food Insecurity: Not on file  Transportation Needs: Not on file  Physical Activity: Not on file  Stress: Not on file  Social Connections: Not on file  Intimate Partner Violence: Not on file    SDOH:  SDOH Screenings   Alcohol Screen: Low Risk  (08/12/2017)  Depression (PHQ2-9): Medium Risk (08/04/2022)  Tobacco  Use: High Risk (08/04/2022)    Last Labs:  Admission on 04/08/2022, Discharged on 04/08/2022  Component Date Value Ref Range Status   Sodium 04/08/2022 145  135 - 145 mmol/L Final   Potassium 04/08/2022 3.3 (L)  3.5 - 5.1 mmol/L Final   Chloride 04/08/2022 110  98 - 111 mmol/L Final   CO2 04/08/2022 24  22 - 32 mmol/L Final   Glucose, Bld 04/08/2022 88  70 - 99 mg/dL Final   Glucose reference range applies only to samples taken after fasting for at least 8 hours.   BUN 04/08/2022 12  6 - 20 mg/dL Final   Creatinine, Ser 04/08/2022 0.72  0.44 - 1.00 mg/dL Final   Calcium 51/76/1607 9.3  8.9 - 10.3 mg/dL Final   Total Protein 37/07/6268 6.9  6.5 - 8.1 g/dL Final   Albumin 48/54/6270 3.9  3.5 - 5.0 g/dL Final   AST 35/00/9381 16  15 - 41 U/L Final   ALT 04/08/2022 20  0 - 44 U/L Final   Alkaline Phosphatase 04/08/2022 93  38 - 126 U/L Final   Total Bilirubin 04/08/2022 0.5  0.3 - 1.2 mg/dL Final   GFR, Estimated 04/08/2022 >60  >60 mL/min Final   Comment: (NOTE) Calculated using the CKD-EPI Creatinine Equation (2021)    Anion gap 04/08/2022 11  5 - 15 Final   Performed at [facility], [address], [place], [place] [address]   WBC 04/08/2022 8.0  4.0 - 10.5 K/uL Final   RBC 04/08/2022 4.31  3.87 - 5.11 MIL/uL Final   Hemoglobin 04/08/2022 12.1  12.0 - 15.0 g/dL Final   HCT 71/69/6789 39.0  36.0 - 46.0 % Final   MCV 04/08/2022 90.5  80.0 - 100.0 fL Final   MCH 04/08/2022 28.1  26.0 - 34.0 pg Final   MCHC 04/08/2022 31.0  30.0 - 36.0 g/dL Final   RDW 38/07/1750 14.8  11.5 - 15.5 % Final   Platelets 04/08/2022 255  150 - 400 K/uL Final   nRBC 04/08/2022 0.0  0.0 - 0.2 % Final   Neutrophils Relative % 04/08/2022 68  % Final   Neutro Abs 04/08/2022 5.5  1.7 - 7.7 K/uL Final   Lymphocytes Relative 04/08/2022 27  % Final   Lymphs Abs 04/08/2022 2.1  0.7 - 4.0 K/uL Final   Monocytes Relative 04/08/2022 4  % Final  Monocytes Absolute 04/08/2022 0.3  0.1 - 1.0 K/uL Final    Eosinophils Relative 04/08/2022 1  % Final   Eosinophils Absolute 04/08/2022 0.1  0.0 - 0.5 K/uL Final   Basophils Relative 04/08/2022 0  % Final   Basophils Absolute 04/08/2022 0.0  0.0 - 0.1 K/uL Final   Immature Granulocytes 04/08/2022 0  % Final   Abs Immature Granulocytes 04/08/2022 0.01  0.00 - 0.07 K/uL Final   Performed at [facility], [place] [address], [place], [place] [address]   ABO/RH(D) 04/08/2022 O POS   Final   Antibody Screen 04/08/2022 NEG   Final   Sample Expiration 04/08/2022    Final                   Value:04/11/2022,2359 Performed at [facility], [place] [address] [place], [place] [address]     Allergies: Patient has no known allergies.  PTA Medications: (Not in a hospital admission)   Long Term Goals: Improvement in symptoms so as ready for discharge  Short Term Goals: Patient will verbalize feelings in meetings with treatment team members., Patient will attend at least of 50% of the groups daily., Pt will complete the PHQ9 on admission, day 3 and discharge., Patient will participate in completing the [place], Patient will score a low risk of violence for 24 hours prior to discharge, and Patient will take medications as prescribed daily.  Medical Decision Making  Raeya Merritts was admitted to [facility] base crisis unit under the service of Hampton Abbot, MD for Cocaine abuse with cocaine-induced mood disorder [facility]), crisis management, and stabilization. Routine labs ordered, which include  Lab Orders         Resp Panel by RT-PCR (Flu A&B, Covid) Anterior Nasal Swab         CBC with Differential/Platelet         Comprehensive metabolic panel         Hemoglobin A1c         Magnesium         Ethanol         Lipid panel         TSH         Prolactin         RPR         Urinalysis, Routine w reflex microscopic Urine, Clean Catch         Pregnancy, urine         HIV Antibody  (routine testing w rflx)         POCT Urine Drug Screen - (I-Screen)    Medication Management: Medications started Meds ordered this encounter  Medications   acetaminophen (TYLENOL) tablet 650 mg   alum & mag hydroxide-simeth (MAALOX/MYLANTA) 200-200-20 MG/5ML suspension 30 mL   magnesium hydroxide (MILK OF MAGNESIA) suspension 30 mL   hydrOXYzine (ATARAX) tablet 25 mg   traZODone (DESYREL) tablet 50 mg   gabapentin (NEURONTIN) tablet 300 mg    Will maintain observation checks every 15 minutes for safety. Psychosocial education regarding relapse prevention and self-care; social and communication  Social work will consult with family for collateral information and discuss discharge and follow up plan.     Recommendations  Based on my evaluation the patient does not appear to have an emergency medical condition.  Keedan Sample, NP 08/04/22  4:24 PM

## 2022-08-04 NOTE — ED Notes (Signed)
Patient arrived on unit. Patient sleeping

## 2022-08-04 NOTE — ED Notes (Signed)
Pt arrived [facility] Unit, food and drinks were offered

## 2022-08-04 NOTE — ED Notes (Signed)
Pt is in the bed resting. Respirations are even and unlabored. No acute distress noted. Will continue to monitor for safety 

## 2022-08-04 NOTE — ED Notes (Signed)
Patient A&Ox4. Patient present to [facility] for Substance use disorder. Patient denies SI/HI and AVH.  Patient denies any physical complaints when asked. No acute distress noted. Support and encouragement provided. Routine safety checks conducted according to facility protocol. Encouraged patient to notify staff if thoughts of harm toward self or others arise. Patient verbalize understanding and agreement. Will continue to monitor for safety.

## 2022-08-05 ENCOUNTER — Encounter (HOSPITAL_COMMUNITY): Payer: Self-pay

## 2022-08-05 ENCOUNTER — Encounter (HOSPITAL_COMMUNITY): Payer: Self-pay | Admitting: Registered Nurse

## 2022-08-05 DIAGNOSIS — Z720 Tobacco use: Secondary | ICD-10-CM | POA: Diagnosis not present

## 2022-08-05 DIAGNOSIS — F17200 Nicotine dependence, unspecified, uncomplicated: Secondary | ICD-10-CM

## 2022-08-05 DIAGNOSIS — Z1152 Encounter for screening for COVID-19: Secondary | ICD-10-CM | POA: Diagnosis not present

## 2022-08-05 DIAGNOSIS — F1414 Cocaine abuse with cocaine-induced mood disorder: Secondary | ICD-10-CM | POA: Diagnosis not present

## 2022-08-05 DIAGNOSIS — Z9151 Personal history of suicidal behavior: Secondary | ICD-10-CM

## 2022-08-05 HISTORY — DX: Nicotine dependence, unspecified, uncomplicated: F17.200

## 2022-08-05 HISTORY — DX: Personal history of suicidal behavior: Z91.51

## 2022-08-05 LAB — RPR: RPR Ser Ql: NONREACTIVE

## 2022-08-05 MED ORDER — GABAPENTIN 300 MG PO CAPS: 300.0000 mg | ORAL_CAPSULE | Freq: Three times a day (TID) | ORAL | Status: DC

## 2022-08-05 MED ORDER — GABAPENTIN 300 MG PO CAPS: 300.0000 mg | ORAL_CAPSULE | Freq: Every day | ORAL | Status: DC

## 2022-08-05 NOTE — ED Notes (Signed)
Pt is in the bed sleeping. Respirations are even and unlabored. No acute distress noted. Will continue to monitor for safety. 

## 2022-08-05 NOTE — ED Notes (Signed)
Pt admitted to [facility] due substance use disorder. Patient was cooperative during the admission assessment. Skin assessment complete. Belongings in the locker. Patient oriented to unit and unit rules. Meal and drinks offered to patient.  Patient verbalized agreement to treatment plans. Patient verbally contracts for safety while hospitalized. Will monitor for safety.

## 2022-08-05 NOTE — ED Notes (Signed)
Patient awake and alert on unit.  She ate breakfast and met with M.D.  Patient is calm and pleasant on approach. She attended part of an NA meeting and then returned to room and bed.  Will monitor.

## 2022-08-05 NOTE — ED Provider Notes (Signed)
[facility] Crisis Behavioral Health Progress Note  Date & Time: 08/05/2022 11:46 AM Name: Alexandra Buckley Age: 51 y.o.  DOB: 10-05-1971  MRN: [id number]  Diagnosis:  Final diagnoses:  Cocaine abuse with cocaine-induced mood disorder ([place])    Reason for presentation: Addiction Problem  Brief HPI  Alexandra Buckley is a 51 y.o. female, with PMH cocaine use disorder with cocaine induced mood disorder, MDD, tobacco use disorder, suicide attempt with inpatient psych admission (x 1, 2018), who presented Voluntary to [place] (08/05/2022) with friend  then admitted to [facility] for assistance with substance use treatment and residential. Referred by [facility] to come to [facility] first. She is interested in residential rehab.  BAL negative UDS + cocaine  Interval Hx   Patient Narrative:   Patient was initially seen sitting in the day room, awake, no acute distress.  She confirmed HPI from 10/31.  Stated that she is 100% confident and motivated to quit using cocaine.  Patient declined medication to help with tobacco cessation, stated that she is able to do on her own, that she has quit cold [place] in the past.  Stated she is currently smoking 1/3 pack a day.  She denied alcohol use, stated the last time was months ago.  Denied history of seizures or DT.  She denied current opioid use, other stimulant use like meth, and sedative hypnotics such as benzodiazepine, Ambien.  She reported withdrawal symptoms of being sleepy and cold.  Denied craving at this time.  She inquired the indication for her gabapentin, informed patient that has multiple indications, such as off label anxiety, off-label alcohol cravings, nerve pain.  Stated that she was not taking this medication prior to [facility].  Informed patient that because she is not withdrawing from alcohol, that she is able to go to day [place] on 11/2, patient was amenable to plan.  She had no other questions or concerns.  [id number] Thoughts: No (Contracted to  safety) [id number] Thoughts: No [id number]: None  Mood: Euthymic Sleep:Fair Appetite: Good Review of Systems  Respiratory:  Negative for shortness of breath.   Cardiovascular:  Negative for chest pain.  Gastrointestinal:  Negative for abdominal pain, constipation, diarrhea, nausea and vomiting.  Musculoskeletal:  Negative for myalgias.  Neurological:  Negative for dizziness, tremors and headaches.     Past History   Psychiatric History: Per H&P  Psychiatric Family History: Per H&P  Social History:  Per H&P  Past Medical History:  Past Medical History:  Diagnosis Date  . History of suicide attempt 08/05/2022   2018  . Tobacco use disorder 08/05/2022   History reviewed. No pertinent surgical history. Family History: History reviewed. No pertinent family history. Social History   Substance and Sexual Activity  Alcohol Use No    Social History   Substance and Sexual Activity  Drug Use Not on file    Social History   Socioeconomic History  . Marital status: Legally Separated    Spouse name: Not on file  . Number of children: Not on file  . Years of education: Not on file  . Highest education level: Not on file  Occupational History  . Not on file  Tobacco Use  . Smoking status: Every Day    Types: Cigarettes  . Smokeless tobacco: Never  Vaping Use  . Vaping Use: Never used  Substance and Sexual Activity  . Alcohol use: No  . Drug use: Not on file  . Sexual activity: Not on file  Other Topics Concern  .  Not on file  Social History Narrative  . Not on file   Social Determinants of Health   Financial Resource Strain: Not on file  Food Insecurity: Not on file  Transportation Needs: Not on file  Physical Activity: Not on file  Stress: Not on file  Social Connections: Not on file   SDOH: SDOH Screenings   Alcohol Screen: Low Risk  (08/12/2017)  Depression (PHQ2-9): Medium Risk (08/04/2022)  Tobacco Use: High Risk (08/05/2022)   Additional  Social History: Alcohol / Drug Use Pain Medications: SEE MAR Prescriptions: SEE MAR Over the Counter: SEE MAR History of alcohol / drug use?: Yes Longest period of sobriety (when/how long): unknown Negative Consequences of Use: Financial, Personal relationships, Work / School Withdrawal Symptoms: None Substance #1 Name of Substance 1: Cocaine 1 - Age of First Use: 27 1 - Amount (size/oz): 8 ball 1 - Frequency: daily 1 - Duration: 4-5 months 1 - Last Use / Amount: sunday 1 - Method of Aquiring: unknown 1- Route of Use: unknown Current Medications   Current Facility-Administered Medications  Medication Dose Route Frequency Provider Last Rate Last Admin  . acetaminophen (TYLENOL) tablet 650 mg  650 mg Oral Q6H PRN Rankin, Shuvon B, NP      . alum & mag hydroxide-simeth (MAALOX/MYLANTA) 200-200-20 MG/5ML suspension 30 mL  30 mL Oral Q4H PRN Rankin, Shuvon B, NP      . gabapentin (NEURONTIN) capsule 300 mg  300 mg Oral QHS Merrily Brittle, DO      . hydrOXYzine (ATARAX) tablet 25 mg  25 mg Oral TID PRN Rankin, Shuvon B, NP      . magnesium hydroxide (MILK OF MAGNESIA) suspension 30 mL  30 mL Oral Daily PRN Rankin, Shuvon B, NP      . traZODone (DESYREL) tablet 50 mg  50 mg Oral QHS PRN Rankin, Shuvon B, NP       No current outpatient medications on file.    Labs / Images  Lab Results:  Admission on 08/04/2022  Component Date Value Ref Range Status  . SARS Coronavirus 2 by RT PCR 08/04/2022 NEGATIVE  NEGATIVE Final   Comment: (NOTE) SARS-CoV-2 target nucleic acids are NOT DETECTED.  The SARS-CoV-2 RNA is generally detectable in upper respiratory specimens during the acute phase of infection. The lowest concentration of SARS-CoV-2 viral copies this assay can detect is 138 copies/mL. A negative result does not preclude SARS-Cov-2 infection and should not be used as the sole basis for treatment or other patient management decisions. A negative result may occur with  improper  specimen collection/handling, submission of specimen other than nasopharyngeal swab, presence of viral mutation(s) within the areas targeted by this assay, and inadequate number of viral copies(<138 copies/mL). A negative result must be combined with clinical observations, patient history, and epidemiological information. The expected result is Negative.  Fact Sheet for Patients:  EntrepreneurPulse.com.au  Fact Sheet for Healthcare Providers:  IncredibleEmployment.be  This test is no                          t yet approved or cleared by the [place] FDA and  has been authorized for detection and/or diagnosis of SARS-CoV-2 by FDA under an Emergency Use Authorization (EUA). This EUA will remain  in effect (meaning this test can be used) for the duration of the COVID-19 declaration under Section 564(b)(1) of the Act, 21 U.S.C.section 360bbb-3(b)(1), unless the authorization is terminated  or revoked sooner.      Marland Kitchen  Influenza A by PCR 08/04/2022 NEGATIVE  NEGATIVE Final  . Influenza B by PCR 08/04/2022 NEGATIVE  NEGATIVE Final   Comment: (NOTE) The Xpert Xpress SARS-CoV-2/FLU/RSV plus assay is intended as an aid in the diagnosis of influenza from Nasopharyngeal swab specimens and should not be used as a sole basis for treatment. Nasal washings and aspirates are unacceptable for Xpert Xpress SARS-CoV-2/FLU/RSV testing.  Fact Sheet for Patients: EntrepreneurPulse.com.au  Fact Sheet for Healthcare Providers: IncredibleEmployment.be  This test is not yet approved or cleared by the [place] FDA and has been authorized for detection and/or diagnosis of SARS-CoV-2 by FDA under an Emergency Use Authorization (EUA). This EUA will remain in effect (meaning this test can be used) for the duration of the COVID-19 declaration under Section 564(b)(1) of the Act, 21 U.S.C. section 360bbb-3(b)(1), unless the  authorization is terminated or revoked.  Performed at [facility], [place] [address] [place], [place] [address]   . WBC 08/04/2022 5.6  4.0 - 10.5 K/uL Final  . RBC 08/04/2022 4.62  3.87 - 5.11 MIL/uL Final  . Hemoglobin 08/04/2022 13.3  12.0 - 15.0 g/dL Final  . HCT 08/04/2022 41.4  36.0 - 46.0 % Final  . MCV 08/04/2022 89.6  80.0 - 100.0 fL Final  . MCH 08/04/2022 28.8  26.0 - 34.0 pg Final  . MCHC 08/04/2022 32.1  30.0 - 36.0 g/dL Final  . RDW 08/04/2022 14.5  11.5 - 15.5 % Final  . Platelets 08/04/2022 318  150 - 400 K/uL Final  . nRBC 08/04/2022 0.0  0.0 - 0.2 % Final  . Neutrophils Relative % 08/04/2022 52  % Final  . Neutro Abs 08/04/2022 2.9  1.7 - 7.7 K/uL Final  . Lymphocytes Relative 08/04/2022 42  % Final  . Lymphs Abs 08/04/2022 2.4  0.7 - 4.0 K/uL Final  . Monocytes Relative 08/04/2022 5  % Final  . Monocytes Absolute 08/04/2022 0.3  0.1 - 1.0 K/uL Final  . Eosinophils Relative 08/04/2022 1  % Final  . Eosinophils Absolute 08/04/2022 0.1  0.0 - 0.5 K/uL Final  . Basophils Relative 08/04/2022 0  % Final  . Basophils Absolute 08/04/2022 0.0  0.0 - 0.1 K/uL Final  . Immature Granulocytes 08/04/2022 0  % Final  . Abs Immature Granulocytes 08/04/2022 0.01  0.00 - 0.07 K/uL Final   Performed at [facility], [place] [address], [place], [place] [address]  . Sodium 08/04/2022 145  135 - 145 mmol/L Final  . Potassium 08/04/2022 3.4 (L)  3.5 - 5.1 mmol/L Final  . Chloride 08/04/2022 105  98 - 111 mmol/L Final  . CO2 08/04/2022 25  22 - 32 mmol/L Final  . Glucose, Bld 08/04/2022 80  70 - 99 mg/dL Final   Glucose reference range applies only to samples taken after fasting for at least 8 hours.  . BUN 08/04/2022 5 (L)  6 - 20 mg/dL Final  . Creatinine, Ser 08/04/2022 0.64  0.44 - 1.00 mg/dL Final  . Calcium 08/04/2022 9.7  8.9 - 10.3 mg/dL Final  . Total Protein 08/04/2022 7.2  6.5 - 8.1 g/dL Final  . Albumin 08/04/2022 3.7  3.5 - 5.0 g/dL Final  . AST 08/04/2022 14  (L)  15 - 41 U/L Final  . ALT 08/04/2022 13  0 - 44 U/L Final  . Alkaline Phosphatase 08/04/2022 93  38 - 126 U/L Final  . Total Bilirubin 08/04/2022 0.2 (L)  0.3 - 1.2 mg/dL Final  . GFR,  Estimated 08/04/2022 >60  >60 mL/min Final   Comment: (NOTE) Calculated using the CKD-EPI Creatinine Equation (2021)   . Anion gap 08/04/2022 15  5 - 15 Final   Performed at [facility] [address], [place], [place] [address]  . Hgb A1c MFr Bld 08/04/2022 5.5  4.8 - 5.6 % Final   Comment: (NOTE) Pre diabetes:          5.7%-6.4%  Diabetes:              >6.4%  Glycemic control for   <7.0% adults with diabetes   . Mean Plasma Glucose 08/04/2022 111.15  mg/dL Final   Performed at [facility] [address] [place], [place] [address]  . Magnesium 08/04/2022 2.1  1.7 - 2.4 mg/dL Final   Performed at [facility], [place] [address] [place], [place] [address]  . Alcohol, Ethyl (B) 08/04/2022 <10  <10 mg/dL Final   Comment: (NOTE) Lowest detectable limit for serum alcohol is 10 mg/dL.  For medical purposes only. Performed at [facility], [place] [address] [place], [place] [address]   . Cholesterol 08/04/2022 182  0 - 200 mg/dL Final  . Triglycerides 08/04/2022 56  <150 mg/dL Final  . HDL 08/04/2022 55  >40 mg/dL Final  . Total CHOL/HDL Ratio 08/04/2022 3.3  RATIO Final  . VLDL 08/04/2022 11  0 - 40 mg/dL Final  . LDL Cholesterol 08/04/2022 116 (H)  0 - 99 mg/dL Final   Comment:        Total Cholesterol/HDL:CHD Risk Coronary Heart Disease Risk Table                     Men   Women  1/2 Average Risk   3.4   3.3  Average Risk       5.0   4.4  2 X Average Risk   9.6   7.1  3 X Average Risk  23.4   11.0        Use the calculated Patient Ratio above and the CHD Risk Table to determine the patient's CHD Risk.        ATP III CLASSIFICATION (LDL):  <100     mg/dL   Optimal  100-129  mg/dL   Near or Above                    Optimal  130-159  mg/dL   Borderline   160-189  mg/dL   High  >190     mg/dL   Very High Performed at [facility] [address], [place], [place] [address]   . TSH 08/04/2022 1.559  0.350 - 4.500 uIU/mL Final   Comment: Performed by a 3rd Generation assay with a functional sensitivity of <=0.01 uIU/mL. Performed at [facility], [place] [address], [place], [place] [address]   . RPR Ser Ql 08/04/2022 NON REACTIVE  NON REACTIVE Final   Performed at [facility], [place] [address] [place], [place] [address]  . Color, Urine 08/04/2022 YELLOW  YELLOW Final  . APPearance 08/04/2022 HAZY (A)  CLEAR Final  . Specific Gravity, Urine 08/04/2022 1.019  1.005 - 1.030 Final  . pH 08/04/2022 6.0  5.0 - 8.0 Final  . Glucose, UA 08/04/2022 NEGATIVE  NEGATIVE mg/dL Final  . Hgb urine dipstick 08/04/2022 NEGATIVE  NEGATIVE Final  . Bilirubin Urine 08/04/2022 NEGATIVE  NEGATIVE Final  . Ketones, ur  08/04/2022 NEGATIVE  NEGATIVE mg/dL Final  . Protein, ur 08/04/2022 NEGATIVE  NEGATIVE mg/dL Final  . Nitrite 08/04/2022 NEGATIVE  NEGATIVE Final  . Chalmers Guest 08/04/2022 SMALL (A)  NEGATIVE Final  . RBC / HPF 08/04/2022 0-5  0 - 5 RBC/hpf Final  . WBC, UA 08/04/2022 6-10  0 - 5 WBC/hpf Final  . Bacteria, UA 08/04/2022 NONE SEEN  NONE SEEN Final  . Squamous Epithelial / LPF 08/04/2022 11-20  0 - 5 Final  . Mucus 08/04/2022 PRESENT   Final   Performed at [facility], [place] [address] [place], [place] [address]  . Preg Test, Ur 08/04/2022 NEGATIVE  NEGATIVE Final   Comment:        THE SENSITIVITY OF THIS METHODOLOGY IS >20 mIU/mL. Performed at [facility], [place] [address] [place], [place] [address]   . POC Amphetamine UR 08/04/2022 None Detected  NONE DETECTED (Cut Off Level 1000 ng/mL) Final  . POC Secobarbital (BAR) 08/04/2022 None Detected  NONE DETECTED (Cut Off Level 300 ng/mL) Final  . POC Buprenorphine (BUP) 08/04/2022 None Detected  NONE DETECTED (Cut Off Level 10 ng/mL) Final  . POC Oxazepam (BZO)  08/04/2022 None Detected  NONE DETECTED (Cut Off Level 300 ng/mL) Final  . POC Cocaine UR 08/04/2022 Positive (A)  NONE DETECTED (Cut Off Level 300 ng/mL) Final  . POC Methamphetamine UR 08/04/2022 None Detected  NONE DETECTED (Cut Off Level 1000 ng/mL) Final  . POC Morphine 08/04/2022 None Detected  NONE DETECTED (Cut Off Level 300 ng/mL) Final  . POC Methadone UR 08/04/2022 None Detected  NONE DETECTED (Cut Off Level 300 ng/mL) Final  . POC Oxycodone UR 08/04/2022 None Detected  NONE DETECTED (Cut Off Level 100 ng/mL) Final  . POC Marijuana UR 08/04/2022 None Detected  NONE DETECTED (Cut Off Level 50 ng/mL) Final  . SARSCOV2ONAVIRUS 2 AG 08/04/2022 NEGATIVE  NEGATIVE Final   Comment: (NOTE) SARS-CoV-2 antigen NOT DETECTED.   Negative results are presumptive.  Negative results do not preclude SARS-CoV-2 infection and should not be used as the sole basis for treatment or other patient management decisions, including infection  control decisions, particularly in the presence of clinical signs and  symptoms consistent with COVID-19, or in those who have been in contact with the virus.  Negative results must be combined with clinical observations, patient history, and epidemiological information. The expected result is Negative.  Fact Sheet for Patients: HandmadeRecipes.com.cy  Fact Sheet for Healthcare Providers: FuneralLife.at  This test is not yet approved or cleared by the [place] FDA and  has been authorized for detection and/or diagnosis of SARS-CoV-2 by FDA under an Emergency Use Authorization (EUA).  This EUA will remain in effect (meaning this test can be used) for the duration of  the COV                          ID-19 declaration under Section 564(b)(1) of the Act, 21 U.S.C. section 360bbb-3(b)(1), unless the authorization is terminated or revoked sooner.    . Preg Test, Ur 08/04/2022 NEGATIVE  NEGATIVE Final   Comment:         THE SENSITIVITY OF THIS METHODOLOGY IS >24 mIU/mL   Admission on 04/08/2022, Discharged on 04/08/2022  Component Date Value Ref Range Status  . Sodium 04/08/2022 145  135 - 145 mmol/L Final  . Potassium 04/08/2022 3.3 (L)  3.5 - 5.1 mmol/L Final  . Chloride 04/08/2022 110  98 -  111 mmol/L Final  . CO2 04/08/2022 24  22 - 32 mmol/L Final  . Glucose, Bld 04/08/2022 88  70 - 99 mg/dL Final   Glucose reference range applies only to samples taken after fasting for at least 8 hours.  . BUN 04/08/2022 12  6 - 20 mg/dL Final  . Creatinine, Ser 04/08/2022 0.72  0.44 - 1.00 mg/dL Final  . Calcium 04/08/2022 9.3  8.9 - 10.3 mg/dL Final  . Total Protein 04/08/2022 6.9  6.5 - 8.1 g/dL Final  . Albumin 04/08/2022 3.9  3.5 - 5.0 g/dL Final  . AST 04/08/2022 16  15 - 41 U/L Final  . ALT 04/08/2022 20  0 - 44 U/L Final  . Alkaline Phosphatase 04/08/2022 93  38 - 126 U/L Final  . Total Bilirubin 04/08/2022 0.5  0.3 - 1.2 mg/dL Final  . GFR, Estimated 04/08/2022 >60  >60 mL/min Final   Comment: (NOTE) Calculated using the CKD-EPI Creatinine Equation (2021)   . Anion gap 04/08/2022 11  5 - 15 Final   Performed at [facility] [address], [place], [place] [address]  . WBC 04/08/2022 8.0  4.0 - 10.5 K/uL Final  . RBC 04/08/2022 4.31  3.87 - 5.11 MIL/uL Final  . Hemoglobin 04/08/2022 12.1  12.0 - 15.0 g/dL Final  . HCT 04/08/2022 39.0  36.0 - 46.0 % Final  . MCV 04/08/2022 90.5  80.0 - 100.0 fL Final  . MCH 04/08/2022 28.1  26.0 - 34.0 pg Final  . MCHC 04/08/2022 31.0  30.0 - 36.0 g/dL Final  . RDW 04/08/2022 14.8  11.5 - 15.5 % Final  . Platelets 04/08/2022 255  150 - 400 K/uL Final  . nRBC 04/08/2022 0.0  0.0 - 0.2 % Final  . Neutrophils Relative % 04/08/2022 68  % Final  . Neutro Abs 04/08/2022 5.5  1.7 - 7.7 K/uL Final  . Lymphocytes Relative 04/08/2022 27  % Final  . Lymphs Abs 04/08/2022 2.1  0.7 - 4.0 K/uL Final  . Monocytes Relative 04/08/2022 4  % Final  . Monocytes  Absolute 04/08/2022 0.3  0.1 - 1.0 K/uL Final  . Eosinophils Relative 04/08/2022 1  % Final  . Eosinophils Absolute 04/08/2022 0.1  0.0 - 0.5 K/uL Final  . Basophils Relative 04/08/2022 0  % Final  . Basophils Absolute 04/08/2022 0.0  0.0 - 0.1 K/uL Final  . Immature Granulocytes 04/08/2022 0  % Final  . Abs Immature Granulocytes 04/08/2022 0.01  0.00 - 0.07 K/uL Final   Performed at [facility], [place] [address] [place], [place] [address]  . ABO/RH(D) 04/08/2022 O POS   Final  . Antibody Screen 04/08/2022 NEG   Final  . Sample Expiration 04/08/2022    Final                   Value:04/11/2022,2359 Performed at [facility], [place] [address], [place], [place] [address]    Blood Alcohol level:  Lab Results  Component Value Date   [facility] <10 08/04/2022   ETH <5 46/96/2952   Metabolic Disorder Labs: Lab Results  Component Value Date   HGBA1C 5.5 08/04/2022   MPG 111.15 08/04/2022   No results found for: "PROLACTIN" Lab Results  Component Value Date   CHOL 182 08/04/2022   TRIG 56 08/04/2022   HDL 55 08/04/2022   CHOLHDL 3.3 08/04/2022   VLDL 11 08/04/2022   LDLCALC 116 (H) 08/04/2022   Therapeutic Lab Levels:  No results found for: "LITHIUM" No results found for: "VALPROATE" No results found for: "CBMZ" Physical Findings   AIMS    Flowsheet Row Admission (Discharged) from 03/02/2017 in [facility]  AIMS Total Score 0      AUDIT    Flowsheet Row Admission (Discharged) from 03/02/2017 in [facility]  Alcohol Use Disorder Identification Test Final Score (AUDIT) 2      PHQ2-9    Flowsheet Row ED from 08/04/2022 in [facility]  PHQ-2 Total Score 2  PHQ-9 Total Score 6      Flowsheet Row ED from 08/04/2022 in [facility] ED from 04/09/2022 in [facility] ED from 04/08/2022 in [facility] No Risk No Risk No Risk       Musculoskeletal  Strength & Muscle Tone: within normal limits Gait & Station: normal Patient leans: N/A   Psychiatric Specialty Exam   Presentation  General Appearance:Disheveled Eye Contact:Fair Speech:Clear and Coherent, Normal Rate Volume:Normal Handedness:Right  Mood and Affect  Mood:Euthymic Affect:Appropriate, Congruent, Full Range (Became tearful when talking about grandson)  Thought Process  Thought Process:Coherent, Goal Directed, Linear Descriptions of Associations:Intact  Thought Content Suicidal Thoughts:Suicidal Thoughts: No (Contracted to safety) Homicidal Thoughts:Homicidal Thoughts: No Hallucinations:Hallucinations: None Ideas of Reference:None Thought Content:Logical, WDL  Sensorium  Memory:Immediate Good Judgment:Fair Insight:Fair  Executive Functions  Orientation:Full (Time, Place and Person) Language:Good Concentration:Good [place] of Knowledge:Good  Psychomotor Activity  Psychomotor Activity:Psychomotor Activity: Normal  Assets  Assets:Communication Skills, Desire for Improvement, Resilience, Social Support  Sleep  Quality:Fair  Physical Exam  BP (!) 150/80 Comment: RN aware  Pulse 73   Temp 98.6 F (37 C) (Oral)   Resp 18   Ht 5\' 2"  (1.575 m)   Wt 151 lb (68.5 kg)   LMP 11/18/2018   SpO2 99%   BMI 27.62 kg/m  Physical Exam Vitals and nursing note reviewed.  Constitutional:      General: She is awake. She is not in acute distress.    Appearance: She is not ill-appearing or diaphoretic.  HENT:     Head: Normocephalic.  Pulmonary:     Effort: Pulmonary effort is normal. No respiratory distress.  Neurological:     Mental Status: She is alert.      Assessment / Plan  Total Time spent with patient: 30 minutes Treatment Plan Summary: Daily contact with patient to assess and evaluate symptoms and progress in  treatment and Medication management  Principal Problem:   Cocaine abuse with cocaine-induced mood disorder ([place]) Active Problems:   Tobacco use disorder  Alexandra Buckley is a 51 y.o. female with PMH cocaine use disorder with cocaine induced mood disorder, MDD, tobacco use disorder, suicide attempt with inpatient psych admission (x 1, 2018), who presented Voluntary to [facility] (08/05/2022) with friend then admitted to [facility] for assistance with substance use treatment and residential. Referred by [facility] to come to [facility] first. She is interested in residential rehab.  BAL negative UDS + cocaine  Total duration of encounter: 1 day  Cocaine use d/o (Action stage) Encouraged cessation Comfort PRNs provided Discontinued gabapentin 300mg  TID - for cocaine related anxiety, started at [facility]. Patient was not on this prior to [facility]. Patient denied anxiety  Tobacco use d/o (Preparation stage) Smokes ~1/3 PPD. Declined medications for cessation.  Encouraged cessation NRTs provided  Clinical Course as of 08/05/22  [place] Aug 05, 2022  1142 Preg Test, Ur: NEGATIVE [JN]  1142 RPR: NON REACTIVE [JN]  1142 AST(!): 14 [JN]  1142 ALT: 13 [JN]  1142 Creatinine: 0.64 [JN]  1142 Alcohol, Ethyl (B): <10 [JN]  [id number] POC Cocaine UR(!): Positive [JN]  1142 TSH: 1.559 [JN]  1142 LDL (calc)(!): 116 [JN]  1142 Hemoglobin: 13.3 [JN]  1142 Platelets: 318 [JN]  1143 EKG 12-Lead NSR, QTc 464 [JN]    Clinical Course User Index [JN] Merrily Brittle, DO     Considerations for follow-up: Recommend high risk screening every 6-12 months with HIV, RPR, hepatitis panel  DISPO: IS amenable to residential rehab. Will attempt to dc patient door-to-door, however if unable will discuss with CD-IOP as bridge to rehab.  Tentative date: 08/06/2022 Location: [facility] -pending acceptance   Signed: Merrily Brittle, DO Psychiatry Resident, PGY-2 08/05/2022, 11:46 AM   [facility] [place], [place]  [address] Dept: [phone] Dept Fax: [phone]

## 2022-08-05 NOTE — Discharge Instructions (Addendum)
Dear Alexandra Buckley,  Most effective treatment for your mental health disease involves BOTH a psychiatrist AND a therapist Psychiatrist to manage medications Therapist to help identify personal goals, barriers from those goals, and plan to achieve those goals by understanding emotions Please make regular appointments with an outpatient psychiatrist and other doctors once you leave the hospital (if any, otherwise, please see below for resources to make an appointment).  For therapy outside the hospital, please ask for these specific types of therapy: DBT ________________________________________________________  SAFETY CRISIS  Dial 988 for [place]    Text [phone] for Crisis Text Line:     [facility]:  [address] FIRST FLOOR.  [place], [place] [address].  [phone]  Mobile Crisis Response Teams Listed by counties in vicinity of [place]. [phone] [place] [phone] [place] [phone] [facility] [phone] [place] [phone] [place]. [phone] [place].  [place] [phone] ________________________________________________________  To see which pharmacy near you is the CHEAPEST for certain medications, please use GoodRx. It is free website and has a free phone app.    Also consider looking at [facility] $4.00 or [facility]'s $7.00 prescription list. Both are free to view if googled "[facility] $4 prescription" and "public's $7 prescription". These are set prices, no insurance required. [facility] low cost medications: $4-$15 for 30days  prescriptions or $10-$38 for 90days prescriptions  ________________________________________________________  Difficulties with sleep?   Can also use this free app for insomnia called CBT-I. Let your doctors and therapists know so they can help with extra tips and tricks or for guidance and accountability. NO ADDS on the app.     ________________________________________________________  Non-Emergent / Urgent  [facility] [address], [place], [place] [address] [phone] OUTPATIENT Walk-in information: Please note, all walk-ins are first come & first serve, with limited number of availability.  Please note that to be eligible for services you must bring: ID or a piece of mail with your name [facility] address  Therapist for therapy:  Monday & Wednesdays: Please ARRIVE at 7:15 AM for registration Will START at 8:00 AM Every 1st & 2nd Friday of the month: Please ARRIVE at 10:15 AM for registration Will START at 1 PM - 5 PM  Psychiatrist for medication management: Monday - Friday:  Please ARRIVE at 7:15 AM for registration Will START at 8:00 AM  Regretfully, due to limited availability, please be aware that you may not been seen on the same day as walk-in. Please consider making an appoint or try again. Thank you for your patience and understanding.

## 2022-08-05 NOTE — ED Notes (Signed)
Pt refused AA 

## 2022-08-05 NOTE — Progress Notes (Signed)
Patient ate lunch without issue.  She is now resting in bed.  Will monitor.

## 2022-08-05 NOTE — Discharge Planning (Signed)
Referral was received and per Sharyn Lull, patient has been accepted and can transfer to the facility on tomorrow by 9:00am if blood pressure is under control. Will need to be under 170/ and not over 110. Update has been provided to the patient and MD made aware. Awaiting update MD regarding plan. Patient will need a 14-30 day supply of medication and one month refill. No nicotine gum allowed, however 14-30 day nicotine patches to be provided if needed. No other needs to report at this time.    LCSW will continue to follow up and provide updates as received.    Lucius Conn, LCSW Clinical Social Worker [place] [facility] Ph: [phone]

## 2022-08-05 NOTE — BH IP Treatment Plan (Signed)
Interdisciplinary Treatment and Diagnostic Plan Update  08/05/2022 Time of Session: 9:00AM Alexandra Buckley MRN: [id number]  Diagnosis:  Final diagnoses:  Cocaine abuse with cocaine-induced mood disorder ([place])     Current Medications:  Current Facility-Administered Medications  Medication Dose Route Frequency Provider Last Rate Last Admin   acetaminophen (TYLENOL) tablet 650 mg  650 mg Oral Q6H PRN Rankin, Shuvon B, NP       alum & mag hydroxide-simeth (MAALOX/MYLANTA) 200-200-20 MG/5ML suspension 30 mL  30 mL Oral Q4H PRN Rankin, Shuvon B, NP       gabapentin (NEURONTIN) tablet 300 mg  300 mg Oral TID Rankin, Shuvon B, NP   300 mg at 08/04/22 2118   hydrOXYzine (ATARAX) tablet 25 mg  25 mg Oral TID PRN Rankin, Shuvon B, NP       magnesium hydroxide (MILK OF MAGNESIA) suspension 30 mL  30 mL Oral Daily PRN Rankin, Shuvon B, NP       traZODone (DESYREL) tablet 50 mg  50 mg Oral QHS PRN Rankin, Shuvon B, NP       Current Outpatient Medications  Medication Sig Dispense Refill   hydrocortisone (ANUSOL-HC) 25 MG suppository Place 1 suppository (25 mg total) rectally 2 (two) times daily. 12 suppository 0   lidocaine (LIDODERM) 5 % Place 1 patch onto the skin daily as needed. Apply patch to area most significant pain once per day.  Remove and discard patch within 12 hours of application. 30 patch 0   methocarbamol (ROBAXIN) 500 MG tablet Take 1 tablet (500 mg total) by mouth every 8 (eight) hours as needed for muscle spasms. 15 tablet 0   PTA Medications: Prior to Admission medications   Medication Sig Start Date End Date Taking? Authorizing Provider  hydrocortisone (ANUSOL-HC) 25 MG suppository Place 1 suppository (25 mg total) rectally 2 (two) times daily. 04/09/22   Petrucelli, Samantha R, PA-C  lidocaine (LIDODERM) 5 % Place 1 patch onto the skin daily as needed. Apply patch to area most significant pain once per day.  Remove and discard patch within 12 hours of application. 04/09/22    Petrucelli, Samantha R, PA-C  methocarbamol (ROBAXIN) 500 MG tablet Take 1 tablet (500 mg total) by mouth every 8 (eight) hours as needed for muscle spasms. 04/09/22   Petrucelli, Glynda Jaeger, PA-C    Patient Stressors: Financial difficulties   Loss of housing   Substance abuse    Patient Strengths: Ability for insight  Average or above average intelligence  Capable of independent living  Communication skills  General fund of knowledge  Motivation for treatment/growth  Supportive family/friends   Treatment Modalities: Medication Management, Group therapy, Case management,  1 to 1 session with clinician, Psychoeducation, Recreational therapy.   Physician Treatment Plan for Primary and Secondary Diagnosis:  Final diagnoses:  Cocaine abuse with cocaine-induced mood disorder ([place])   Long Term Goal(s): Improvement in symptoms so as ready for discharge  Short Term Goals: Patient will verbalize feelings in meetings with treatment team members. Patient will attend at least of 50% of the groups daily. Pt will complete the PHQ9 on admission, day 3 and discharge. Patient will participate in completing the [place] Patient will score a low risk of violence for 24 hours prior to discharge Patient will take medications as prescribed daily.  Medication Management: Evaluate patient's response, side effects, and tolerance of medication regimen.  Therapeutic Interventions: 1 to 1 sessions, Unit Group sessions and Medication administration.  Evaluation of Outcomes: Progressing  LCSW Treatment Plan for Primary Diagnosis:  Final diagnoses:  Cocaine abuse with cocaine-induced mood disorder (H[facility])    Long Term Goal(s): Safe transition to appropriate next level of care at discharge.  Short Term Goals: Facilitate acceptance of mental health diagnosis and concerns through verbal commitment to aftercare plan and appointments at discharge., Patient will identify one social  support prior to discharge to aid in patient's recovery., Patient will attend AA/NA groups as scheduled., Identify minimum of 2 triggers associated with mental health/substance abuse issues with treatment team members., and Increase skills for wellness and recovery by attending 50% of scheduled groups.  Therapeutic Interventions: Assess for all discharge needs, 1 to 1 time with Child psychotherapist, Explore available resources and support systems, Assess for adequacy in community support network, Educate family and significant other(s) on suicide prevention, Complete Psychosocial Assessment, Interpersonal group therapy.  Evaluation of Outcomes: Progressing   Progress in Treatment: Attending groups: Yes. Participating in groups: Yes. Taking medication as prescribed: Yes. Toleration medication: Yes. Family/Significant other contact made: No, will contact:  Patient's son Alexandra Buckley [phone] to gain collateral.  Permission provided by patient. Patient understands diagnosis: Yes. Discussing patient identified problems/goals with staff: Yes. Medical problems stabilized or resolved: Yes. Denies suicidal/homicidal ideation: Yes. Issues/concerns per patient self-inventory: Yes. Other: homelessness and substance use  New problem(s) identified: No, Describe:  none other than reported at admission  New Short Term/Long Term Goal(s):Safe transition to appropriate next level of care at discharge, Engage patient in therapeutic group addressing interpersonal concerns. Engage patient in aftercare planning with referrals and resources, Increase ability to appropriately verbalize feelings, Facilitate acceptance of mental health diagnosis and concerns and Identify triggers associated with mental health/substance abuse issues.   Patient Goals:  Patient is seeking residential placement at this time and reports an interest in [facility].   Discharge Plan or Barriers: LCSW will send referrals out for  residential placement and will follow up to provide updates as received.   Reason for Continuation of Hospitalization: Medication stabilization  Estimated Length of Stay: 3-5 days  Last 3 [place] Suicide Severity Risk Score: Flowsheet Row ED from 08/04/2022 in [facility] ED from 04/09/2022 in [facility] EMERGENCY DEPARTMENT ED from 04/08/2022 in [facility] EMERGENCY DEPARTMENT  C-SSRS RISK CATEGORY Error: Q3, 4, or 5 should not be populated when Q2 is No No Risk No Risk       Last PHQ 2/9 Scores:    08/04/2022    4:22 PM  Depression screen PHQ 2/9  Decreased Interest 1  Down, Depressed, Hopeless 1  PHQ - 2 Score 2  Altered sleeping 1  Tired, decreased energy 0  Change in appetite 0  Feeling bad or failure about yourself  1  Trouble concentrating 1  Moving slowly or fidgety/restless 0  Suicidal thoughts 1  PHQ-9 Score 6  Difficult doing work/chores Not difficult at all    Scribe for Treatment Team: Loleta Dicker, Theresia Majors 08/05/2022 9:13 AM

## 2022-08-05 NOTE — Tx Team (Signed)
LCSW met with patient to assess current mood, affect, physical state, and inquire about needs/goals while here in [facility] and after discharge. Patient reports she presented due to wanting to detox and needing rehabilitation. Patient reports she has been homeless for a little while, however prior to that she was staying with a family member. Patient denies having access to transportation at this time, and reports having good family support. Patient became very tearful as she spoke about her family and grandson. Patient reports "I want to do better with my life because they are watching". Patient reports her current goal is to seek residential placement at this time for substance use. Patient reports being referred to [facility] by Sharyn Lull at [facility]. Patient reports an interest in going to [facility] once stable for discharge. Patient currently denies any SI/HI/AVH and reports mood as "fine". Patient aware that LCSW will send referrals out for review and will follow up to provide updates as received. Patient expressed understanding and appreciation of LCSW assistance. No other needs were reported at this time by patient.   Referral has been sent to [place] for review.   LCSW will continue to follow and provide support to patient while on [facility] unit.   Lucius Conn, LCSW Clinical Social Worker [place] [facility] Ph: [phone]

## 2022-08-05 NOTE — [facility] Group Notes (Signed)
[facility] LCSW Group Therapy   Date/ Time: 08/05/2022 at 1:30pm  Type of Therapy:  Group Therapy  Participation Level:  Active  Participation Quality:  Appropriate  Affect:  Appropriate  Cognitive:  Appropriate  Insight:  Developing/Improving  Engagement in Therapy:  Developing/Improving  Modes of Intervention:  Activity, Discussion, Rapport Building, Socialization and Support  Summary of Progress/Problems: Patient actively participated in group on today. Group started off with introductions and group rules. Group members participated in a therapeutic activity that required active listening and communication skills. Group members were able to identify similarities and differences within the group. Patient interacted positively with staff and peers. No issues to report.   Analeigh Aries, LCSW Clinical Social Worker [name] [facility] Ph: [phone]  

## 2022-08-06 DIAGNOSIS — Z720 Tobacco use: Secondary | ICD-10-CM | POA: Diagnosis not present

## 2022-08-06 DIAGNOSIS — F1414 Cocaine abuse with cocaine-induced mood disorder: Secondary | ICD-10-CM | POA: Diagnosis not present

## 2022-08-06 DIAGNOSIS — Z1152 Encounter for screening for COVID-19: Secondary | ICD-10-CM | POA: Diagnosis not present

## 2022-08-06 LAB — PROLACTIN: Prolactin: 7.6 ng/mL (ref 4.8–23.3)

## 2022-08-06 MED ORDER — MENTHOL 3 MG MT LOZG
1.0000 | LOZENGE | OROMUCOSAL | Status: DC | PRN
  Administered 2022-08-06: 3 mg via ORAL
  Filled 2022-08-06: qty 9

## 2022-08-06 NOTE — ED Notes (Addendum)
Pt came to nurse requesting for medication for cough and runny nose. Provider notified.

## 2022-08-06 NOTE — ED Provider Notes (Signed)
FBC/OBS ASAP Discharge Summary  Date and Time: 08/06/2022, 11:39 AM  Name: Alexandra Buckley  Age: 51 y.o.  DOB: 1970-11-03  MRN:  [id number]   Discharge Diagnoses:  Final diagnoses:  Cocaine abuse with cocaine-induced mood disorder (HCC)  Tobacco use disorder    HPI:  Per H&P: " Alexandra Buckley, 51 y.o., female patient presents to [facility] as a walk in voluntarily with complaints of cocaine abuse and seeking detox/rehab services   Patient seen face to face by this provider, consulted with Dr. Nelly Rout; and chart reviewed on 08/04/22.  On evaluation Alexandra Buckley reports she was "referred here by [facility] in [facility].  She said I would need to come here first and then could come to rehab program."  Patient states that she is homeless in [place] "I was living with my niece until she moved and now I'm homeless."  Patient states she has only had one psychiatric hospitalization in 2018 after suicide attempt "I mixed a bunch of chemical together and drank them.  I was at [facility] ED and then I was sent to behavioral hospital across the street [facility])."  Patient states she has never been to rehab or had substance abuse services.   States she needs to get her life together.  "I have 6 kids total 4 girls and 2 boys.  The other day I talked to my grandson and he said g-ma I haven't seen you in a long time.  It's been six months since I've seen him and I use to have him ever weekend.  I said to myself last night what good am I to my kids."  Patient then became tearful.  Currently patient denies suicidal/self-harm/homicidal ideation, psychosis, and paranoia.  She states she uses "eight ball" (3.5 grams $ 200 to $ 300) of cocaine daily.  "Sell everything I can get my hands on, trick, and call my kids for money.  Patient states she is unemployed.   During evaluation Alexandra Buckley is sitting in chair with no noted distress.  She is alert/oriented x 4; calm/cooperative, and mood congruent with affect.  She is  speaking in a clear tone at moderate volume, and normal pace; with good eye contact.  Her thought process is coherent, relevant, and there is no indication that she is currently responding to internal/external stimuli or experiencing delusional thought content.  She denies suicidal/self-harm/homicidal ideation, psychosis, and paranoia.  She remained calm throughout assessment and responded to questions appropriately.     [facility] for cocaine abuse and cocaine induced mood disorder.  Social work to assist with follow up services for outpatient psychiatric services and substance abuse services "  Subjective:  Patient stated that she feels hopeful today and ready to go to [facility]. She denied any questions or concerns.   IO:[id number] Thoughts: No (Contracted to safety) FG:[id number] Thoughts: No RCV:[id number]: None Ideas of NID:POEU   Mood: Euthymic Sleep:Fair Appetite: Good  Review of Systems  Respiratory:  Negative for shortness of breath.   Cardiovascular:  Negative for chest pain.  Gastrointestinal:  Negative for nausea and vomiting.  Neurological:  Negative for dizziness and headaches.    Stay Summary:  Alexandra Buckley is a 51 y.o. female with PMH cocaine use disorder with cocaine induced mood disorder, MDD, tobacco use disorder, suicide attempt with inpatient psych admission (x 1, 2018), who presented Voluntary to [facility] (08/05/2022) with friend then admitted to [facility] for assistance with substance use treatment and residential. Referred  by [facility] to come to [facility] first. She is interested in residential rehab.  BAL negative UDS + cocaine  Total duration of encounter: 2 days    Patient was pleasant and engaging. No acute behavioral concerns, no agitation prns needed. Patient was discharged door-to-door to [facility]   Cocaine use d/o (Action stage) Encouraged cessation Comfort PRNs provided Discontinued gabapentin 300mg  TID - for cocaine related  anxiety, started at [facility]. Patient was not on this prior to [facility]. Patient denied anxiety   Tobacco use d/o (Preparation stage) Smokes ~1/3 PPD. Declined medications for cessation. Reported that she has quit cold-turkey in the past and intended to do it again.  Encouraged cessation NRTs provided  Patient had no home medications. Patient was not started on any medications during stay.   Clinical Course as of 08/06/22 1139  Wed Aug 05, 2022  1142 Preg Test, Ur: NEGATIVE [JN]  4034 RPR: NON REACTIVE [JN]  1142 AST(!): 14 [JN]  1142 ALT: 13 [JN]  1142 Creatinine: 0.64 [JN]  1142 Alcohol, Ethyl (B): <10 [JN]  7425 POC Cocaine UR(!): Positive [JN]  1142 TSH: 1.559 [JN]  1142 LDL (calc)(!): 116 [JN]  1142 Hemoglobin: 13.3 [JN]  1142 Platelets: 318 [JN]  1143 EKG 12-Lead NSR, QTc 464 [JN]    Clinical Course User Index [JN] Merrily Brittle, DO    While future psychiatric events cannot be accurately predicted, the patient does not currently require acute inpatient psychiatric care and does not currently meet [facility] involuntary commitment criteria.  Past Psychiatric History: Per H&P Past Medical History:  Past Medical History:  Diagnosis Date   History of suicide attempt 08/05/2022   2018   Tobacco use disorder 08/05/2022    History reviewed. No pertinent surgical history. Family History:  History reviewed. No pertinent family history. Family Psychiatric History: Per H&P Social History:  Social History   Substance and Sexual Activity  Alcohol Use No     Social History   Substance and Sexual Activity  Drug Use Not on file    Social History   Socioeconomic History   Marital status: Legally Separated    Spouse name: Not on file   Number of children: Not on file   Years of education: Not on file   Highest education level: Not on file  Occupational History   Not on file  Tobacco Use   Smoking status: Every Day    Types: Cigarettes   Smokeless tobacco: Never   Vaping Use   Vaping Use: Never used  Substance and Sexual Activity   Alcohol use: No   Drug use: Not on file   Sexual activity: Not on file  Other Topics Concern   Not on file  Social History Narrative   Not on file   Social Determinants of Health   Financial Resource Strain: Not on file  Food Insecurity: Not on file  Transportation Needs: Not on file  Physical Activity: Not on file  Stress: Not on file  Social Connections: Not on file   SDOH:  SDOH Screenings   Alcohol Screen: Low Risk  (08/12/2017)  Depression (PHQ2-9): Medium Risk (08/04/2022)  Tobacco Use: High Risk (08/05/2022)   Tobacco Cessation:  A prescription for an FDA-approved tobacco cessation medication was offered at discharge and the patient refused  Current Medications:  Current Facility-Administered Medications  Medication Dose Route Frequency Provider Last Rate Last Admin   acetaminophen (TYLENOL) tablet 650 mg  650 mg Oral Q6H PRN Rankin, Shuvon B, NP  alum & mag hydroxide-simeth (MAALOX/MYLANTA) 200-200-20 MG/5ML suspension 30 mL  30 mL Oral Q4H PRN Rankin, Shuvon B, NP       hydrOXYzine (ATARAX) tablet 25 mg  25 mg Oral TID PRN Rankin, Shuvon B, NP       magnesium hydroxide (MILK OF MAGNESIA) suspension 30 mL  30 mL Oral Daily PRN Rankin, Shuvon B, NP       menthol-cetylpyridinium (CEPACOL) lozenge 3 mg  1 lozenge Oral PRN Sindy Guadeloupe, NP   3 mg at 08/06/22 0154   traZODone (DESYREL) tablet 50 mg  50 mg Oral QHS PRN Rankin, Shuvon B, NP       No current outpatient medications on file.    PTA Medications: (Not in a hospital admission)     08/04/2022    4:22 PM  Depression screen PHQ 2/9  Decreased Interest 1  Down, Depressed, Hopeless 1  PHQ - 2 Score 2  Altered sleeping 1  Tired, decreased energy 0  Change in appetite 0  Feeling bad or failure about yourself  1  Trouble concentrating 1  Moving slowly or fidgety/restless 0  Suicidal thoughts 1  PHQ-9 Score 6  Difficult doing  work/chores Not difficult at all    Flowsheet Row ED from 08/04/2022 in [facility] ED from 04/09/2022 in [facility] EMERGENCY DEPARTMENT ED from 04/08/2022 in [facility] EMERGENCY DEPARTMENT  C-SSRS RISK CATEGORY No Risk No Risk No Risk       Musculoskeletal  Strength & Muscle Tone: within normal limits Gait & Station: normal Patient leans: N/A   Psychiatric Specialty Exam   Presentation  General Appearance:Disheveled Eye Contact:Fair Speech:Clear and Coherent, Normal Rate Volume:Normal Handedness:Right  Mood and Affect  Mood:Euthymic Affect:Appropriate, Congruent, Full Range (Became tearful when talking about grandson)  Thought Process  Thought Process:Coherent, Goal Directed, Linear Descriptions of Associations:Intact  Thought Content Suicidal Thoughts:Suicidal Thoughts: No (Contracted to safety) Homicidal Thoughts:Homicidal Thoughts: No Hallucinations:Hallucinations: None Ideas of Reference:None Thought Content:Logical, WDL  Sensorium  Memory:Immediate Good Judgment:Fair Insight:Fair  Executive Functions  Orientation:Full (Time, Place and Person) Language:Good Concentration:Good Attention:Good Recall:Good Fund of Knowledge:Good  Psychomotor Activity  Psychomotor Activity:Psychomotor Activity: Normal  Assets  Assets:Communication Skills, Desire for Improvement, Resilience, Social Support  Sleep  Quality:Fair  Physical Exam  BP (!) 128/52 (BP Location: Right Arm)   Pulse 72   Temp 98.6 F (37 C) (Tympanic)   Resp 16   Ht 5\' 2"  (1.575 m)   Wt 151 lb (68.5 kg)   LMP 11/18/2018   SpO2 99%   BMI 27.62 kg/m   Physical Exam Vitals and nursing note reviewed.  Constitutional:      General: She is awake. She is not in acute distress.    Appearance: She is not ill-appearing or diaphoretic.  HENT:     Head: Normocephalic.  Pulmonary:     Effort: Pulmonary effort is normal. No  respiratory distress.  Neurological:     Mental Status: She is alert.     Demographic Factors:  Low socioeconomic status, Living alone, and Unemployed  Loss Factors: Decrease in vocational status and Financial problems/change in socioeconomic status  Historical Factors: Impulsivity  Risk Reduction Factors:   Sense of responsibility to family, Religious beliefs about death, and Positive social support  Continued Clinical Symptoms:  Alcohol/Substance Abuse/Dependencies  Cognitive Features That Contribute To Risk:  Loss of executive function    Suicide Risk:  Mild:  Suicidal ideation of limited frequency, intensity, duration,  and specificity.  There are no identifiable plans, no associated intent, mild dysphoria and related symptoms, good self-control (both objective and subjective assessment), few other risk factors, and identifiable protective factors, including available and accessible social support.  Plan Of Care/Follow-up recommendations:  Activity and diet at tolerated.  Please: Take all medications as prescribed by your mental healthcare provider. Report any adverse effects and or reactions from the medicines to your outpatient provider promptly. Do not engage in alcohol and or illegal drug use while on prescription medicines.  Disposition: [facility] door-to-door   Total Time spent with patient: 20 minutes  Signed: Princess Bruins, DO Psychiatry Resident, PGY-2 [facility] 08/06/2022, 11:39 AM

## 2022-08-06 NOTE — ED Notes (Signed)
Patient is awake resting in bed quietly.  Patient with some anxiety and dysphoric mood however denies avh shi or plan and will seek out staff to have needs met.  Patient accepted verbal support.  No evidence of withdrawal.  Will monitor and provide a safe environment. 

## 2022-08-10 ENCOUNTER — Encounter: Payer: Self-pay | Admitting: Physician Assistant

## 2022-08-10 ENCOUNTER — Ambulatory Visit: Payer: Self-pay | Admitting: Physician Assistant

## 2022-08-10 VITALS — BP 144/85 | HR 73 | Ht 62.0 in | Wt 148.0 lb

## 2022-08-10 DIAGNOSIS — Z9151 Personal history of suicidal behavior: Secondary | ICD-10-CM

## 2022-08-10 DIAGNOSIS — G8929 Other chronic pain: Secondary | ICD-10-CM

## 2022-08-10 DIAGNOSIS — F1414 Cocaine abuse with cocaine-induced mood disorder: Secondary | ICD-10-CM

## 2022-08-10 DIAGNOSIS — I10 Essential (primary) hypertension: Secondary | ICD-10-CM

## 2022-08-10 DIAGNOSIS — M545 Low back pain, unspecified: Secondary | ICD-10-CM

## 2022-08-10 DIAGNOSIS — F17210 Nicotine dependence, cigarettes, uncomplicated: Secondary | ICD-10-CM

## 2022-08-10 DIAGNOSIS — R030 Elevated blood-pressure reading, without diagnosis of hypertension: Secondary | ICD-10-CM

## 2022-08-10 DIAGNOSIS — E782 Mixed hyperlipidemia: Secondary | ICD-10-CM

## 2022-08-10 DIAGNOSIS — M25512 Pain in left shoulder: Secondary | ICD-10-CM

## 2022-08-10 DIAGNOSIS — F332 Major depressive disorder, recurrent severe without psychotic features: Secondary | ICD-10-CM

## 2022-08-10 DIAGNOSIS — F17200 Nicotine dependence, unspecified, uncomplicated: Secondary | ICD-10-CM

## 2022-08-10 DIAGNOSIS — F3340 Major depressive disorder, recurrent, in remission, unspecified: Secondary | ICD-10-CM

## 2022-08-10 MED ORDER — MELOXICAM 7.5 MG PO TABS: 7.5000 mg | ORAL_TABLET | Freq: Every day | ORAL | 1 refills | Status: DC

## 2022-08-10 MED ORDER — ATORVASTATIN CALCIUM 10 MG PO TABS: 10.0000 mg | ORAL_TABLET | Freq: Every day | ORAL | 1 refills | Status: DC

## 2022-08-10 MED ORDER — AMLODIPINE BESYLATE 5 MG PO TABS: 5.0000 mg | ORAL_TABLET | Freq: Every day | ORAL | 1 refills | Status: DC

## 2022-08-10 MED ORDER — CYCLOBENZAPRINE HCL 10 MG PO TABS: 10.0000 mg | ORAL_TABLET | Freq: Three times a day (TID) | ORAL | 0 refills | Status: DC | PRN

## 2022-08-10 NOTE — Progress Notes (Unsigned)
New Patient Office Visit  Subjective    Patient ID: Alexandra Buckley, female    DOB: 04/18/1971  Age: 51 y.o. MRN: [id number]  CC:  Chief Complaint  Patient presents with   Medication Refill    s    HPI Alexandra Buckley states that she is currently being treated for substance abuse at [facility] residential treatment center, states that she arrived November 2.  States sleep is good, mood is good.  States that she has chronic left shoulder pain, states that she was previously going [facility] and used to "get injections".  States that she was also given gabapentin during detox and thought that it was given to her for her pain.  Describes her shoulder pain as sharp, feels "like a brick is sitting on it".  States that she does occasionally get numbness in her shoulder area, denies radiation.  States that she also suffers from chronic low back pain, states that it has been present for "years", denies injury or trauma.  Describes her back pain as achy, denies radiation down either leg.  States that she was previously treated for hypertension, states that she does not remember the name of the medication she used to take.  Does not check blood pressure at home.  Denies hypertensive symptoms.       The 10-year ASCVD risk score (Arnett DK, et al., 2019) is: 11.7%   Values used to calculate the score:     Age: 39 years     Sex: Female     Is Non-Hispanic African American: Yes     Diabetic: No     Tobacco smoker: Yes     Systolic Blood Pressure: 123456 mmHg     Is BP treated: Yes     HDL Cholesterol: 55 mg/dL     Total Cholesterol: 182 mg/dL   Outpatient Encounter Medications as of 08/10/2022  Medication Sig   amLODipine (NORVASC) 5 MG tablet Take 1 tablet (5 mg total) by mouth daily.   atorvastatin (LIPITOR) 10 MG tablet Take 1 tablet (10 mg total) by mouth at bedtime.   cyclobenzaprine (FLEXERIL) 10 MG tablet Take 1 tablet (10 mg total) by mouth 3 (three) times daily as  needed for muscle spasms.   meloxicam (MOBIC) 7.5 MG tablet Take 1 tablet (7.5 mg total) by mouth daily.   No facility-administered encounter medications on file as of 08/10/2022.    Past Medical History:  Diagnosis Date   History of suicide attempt 08/05/2022   2018   Tobacco use disorder 08/05/2022    History reviewed. No pertinent surgical history.  History reviewed. No pertinent family history.  Social History   Socioeconomic History   Marital status: Legally Separated    Spouse name: Not on file   Number of children: Not on file   Years of education: Not on file   Highest education level: Not on file  Occupational History   Not on file  Tobacco Use   Smoking status: Every Day    Types: Cigarettes   Smokeless tobacco: Never  Vaping Use   Vaping Use: Never used  Substance and Sexual Activity   Alcohol use: No   Drug use: Not on file   Sexual activity: Not on file  Other Topics Concern   Not on file  Social History Narrative   Not on file   Social Determinants of Health   Financial Resource Strain: Not on file  Food Insecurity: Not on file  Transportation Needs: Not on file  Physical Activity: Not on file  Stress: Not on file  Social Connections: Not on file  Intimate Partner Violence: Not on file    Review of Systems  Constitutional: Negative.   HENT: Negative.    Eyes: Negative.   Respiratory:  Negative for shortness of breath.   Cardiovascular:  Negative for chest pain.  Gastrointestinal: Negative.   Genitourinary: Negative.   Musculoskeletal:  Positive for back pain, joint pain and myalgias.  Skin: Negative.   Neurological: Negative.   Endo/Heme/Allergies: Negative.   Psychiatric/Behavioral:  Negative for depression. The patient is not nervous/anxious and does not have insomnia.         Objective    BP (!) 144/85 (BP Location: Right Arm, Patient Position: Sitting, Cuff Size: Normal)   Pulse 73   Ht 5\' 2"  (1.575 m)   Wt 148 lb (67.1 kg)    LMP 11/18/2018   BMI 27.07 kg/m   Physical Exam Vitals and nursing note reviewed.  Constitutional:      Appearance: Normal appearance.  HENT:     Head: Normocephalic and atraumatic.     Right Ear: External ear normal.     Left Ear: External ear normal.     Nose: Nose normal.     Mouth/Throat:     Mouth: Mucous membranes are moist.     Pharynx: Oropharynx is clear.  Eyes:     Extraocular Movements: Extraocular movements intact.     Conjunctiva/sclera: Conjunctivae normal.     Pupils: Pupils are equal, round, and reactive to light.  Cardiovascular:     Rate and Rhythm: Normal rate and regular rhythm.     Pulses: Normal pulses.     Heart sounds: Normal heart sounds.  Pulmonary:     Effort: Pulmonary effort is normal.     Breath sounds: Normal breath sounds.  Musculoskeletal:     Cervical back: Normal range of motion and neck supple.     Comments: Limited room available in screening van, unable to complete full ortho exam  Skin:    General: Skin is warm and dry.  Neurological:     General: No focal deficit present.     Mental Status: She is alert.  Psychiatric:        Mood and Affect: Mood normal.        Thought Content: Thought content normal.        Judgment: Judgment normal.      Assessment & Plan:   Problem List Items Addressed This Visit       Cardiovascular and Mediastinum   Essential hypertension   Relevant Medications   amLODipine (NORVASC) 5 MG tablet   atorvastatin (LIPITOR) 10 MG tablet     Nervous and Auditory   Cocaine abuse with cocaine-induced mood disorder (HCC) - Primary     Other   MDD (major depressive disorder), recurrent severe, without psychosis ([place])   History of suicide attempt   Tobacco use disorder   Chronic left shoulder pain   Relevant Medications   meloxicam (MOBIC) 7.5 MG tablet   cyclobenzaprine (FLEXERIL) 10 MG tablet   Chronic bilateral low back pain without sciatica   Relevant Medications   meloxicam (MOBIC) 7.5 MG  tablet   cyclobenzaprine (FLEXERIL) 10 MG tablet   Mixed hyperlipidemia   Relevant Medications   amLODipine (NORVASC) 5 MG tablet   atorvastatin (LIPITOR) 10 MG tablet   1. Essential hypertension Trial amlodipine, patient encouraged to check blood pressure on  a daily basis, keep a written log and have available for all office visits.  Patient to follow-up with mobile unit in 2 weeks.  Patient had labs completed recently, reviewed, all within normal limits except lipid - amLODipine (NORVASC) 5 MG tablet; Take 1 tablet (5 mg total) by mouth daily.  Dispense: 30 tablet; Refill: 1  2. Chronic left shoulder pain Trial meloxicam, Flexeril.  Patient education given on supportive care, red flags given for prompt reevaluation   IMPRESSION: Slight superior displacement of the clavicle with respect to the a chromium. Findings may represent a low-grade AC joint separation. Clinical correlation advised.     Electronically Signed   By: Kerby Moors M.D.   On: 04/09/2022 08:15  - meloxicam (MOBIC) 7.5 MG tablet; Take 1 tablet (7.5 mg total) by mouth daily.  Dispense: 30 tablet; Refill: 1 - cyclobenzaprine (FLEXERIL) 10 MG tablet; Take 1 tablet (10 mg total) by mouth 3 (three) times daily as needed for muscle spasms.  Dispense: 60 tablet; Refill: 0  3. Chronic bilateral low back pain without sciatica Patient education given on supportive care  4. Mixed hyperlipidemia The 10-year ASCVD risk score (Arnett DK, et al., 2019) is: 11.7%   Values used to calculate the score:     Age: 42 years     Sex: Female     Is Non-Hispanic African American: Yes     Diabetic: No     Tobacco smoker: Yes     Systolic Blood Pressure: 272 mmHg     Is BP treated: Yes     HDL Cholesterol: 55 mg/dL     Total Cholesterol: 182 mg/dL Patient agreeable to trial atorvastatin - atorvastatin (LIPITOR) 10 MG tablet; Take 1 tablet (10 mg total) by mouth at bedtime.  Dispense: 30 tablet; Refill: 1  5. MDD (major  depressive disorder),  Currently controlled  6. Cocaine abuse with cocaine-induced mood disorder ([place]) Currently in substance abuse treatment program  7. History of suicide attempt   8. Tobacco use disorder   No AVS created, no printer available on screening van, patient does not currently have access to MyChart.  Patient education given through teach back method.   I have reviewed the patient's medical history (PMH, PSH, Social History, Family History, Medications, and allergies) , and have been updated if relevant. I spent 40 minutes reviewing chart and  face to face time with patient.    Return in about 2 weeks (around 08/24/2022) for with [facility].   Loraine Grip Mayers, PA-C

## 2022-08-11 DIAGNOSIS — G8929 Other chronic pain: Secondary | ICD-10-CM | POA: Insufficient documentation

## 2022-08-11 DIAGNOSIS — I10 Essential (primary) hypertension: Secondary | ICD-10-CM | POA: Insufficient documentation

## 2022-08-11 DIAGNOSIS — E782 Mixed hyperlipidemia: Secondary | ICD-10-CM | POA: Insufficient documentation

## 2022-08-20 ENCOUNTER — Encounter (HOSPITAL_BASED_OUTPATIENT_CLINIC_OR_DEPARTMENT_OTHER): Payer: Self-pay

## 2022-08-20 ENCOUNTER — Emergency Department (HOSPITAL_BASED_OUTPATIENT_CLINIC_OR_DEPARTMENT_OTHER)
Admission: EM | Admit: 2022-08-20 | Discharge: 2022-08-20 | Disposition: A | Discharge status: Home / Self Care | Payer: Medicaid Other | Attending: Emergency Medicine | Admitting: Emergency Medicine

## 2022-08-20 DIAGNOSIS — I10 Essential (primary) hypertension: Secondary | ICD-10-CM | POA: Insufficient documentation

## 2022-08-20 DIAGNOSIS — Z202 Contact with and (suspected) exposure to infections with a predominantly sexual mode of transmission: Secondary | ICD-10-CM | POA: Insufficient documentation

## 2022-08-20 DIAGNOSIS — Z79899 Other long term (current) drug therapy: Secondary | ICD-10-CM | POA: Insufficient documentation

## 2022-08-20 DIAGNOSIS — A599 Trichomoniasis, unspecified: Secondary | ICD-10-CM | POA: Insufficient documentation

## 2022-08-20 LAB — URINALYSIS, ROUTINE W REFLEX MICROSCOPIC
Bilirubin Urine: NEGATIVE
Glucose, UA: NEGATIVE mg/dL
Ketones, ur: NEGATIVE mg/dL
Nitrite: NEGATIVE
Protein, ur: NEGATIVE mg/dL
Specific Gravity, Urine: >=1.03 (ref 1.005–1.030)
pH: 5.5 (ref 5.0–8.0)

## 2022-08-20 LAB — WET PREP, GENITAL
Sperm: NONE SEEN
WBC, Wet Prep HPF POC: >=10 — AB (ref <10)
Yeast Wet Prep HPF POC: NONE SEEN

## 2022-08-20 LAB — URINALYSIS, MICROSCOPIC (REFLEX)

## 2022-08-20 LAB — PREGNANCY, URINE: Preg Test, Ur: NEGATIVE

## 2022-08-20 MED ORDER — METRONIDAZOLE 500 MG PO TABS
2000.0000 mg | ORAL_TABLET | Freq: Once | ORAL | Status: AC
  Administered 2022-08-20: 2000 mg via ORAL
  Filled 2022-08-20: qty 4

## 2022-08-20 MED ORDER — DOXYCYCLINE HYCLATE 100 MG PO CAPS: 100.0000 mg | ORAL_CAPSULE | Freq: Two times a day (BID) | ORAL | 0 refills | Status: DC

## 2022-08-20 MED ORDER — ACETAMINOPHEN 500 MG PO TABS: 1000.0000 mg | ORAL_TABLET | Freq: Once | ORAL | Status: DC

## 2022-08-20 MED ORDER — CEFTRIAXONE SODIUM 500 MG IJ SOLR
500.0000 mg | Freq: Once | INTRAMUSCULAR | Status: AC
  Administered 2022-08-20: 500 mg via INTRAMUSCULAR
  Filled 2022-08-20: qty 500

## 2022-08-20 MED ORDER — ONDANSETRON 4 MG PO TBDP
4.0000 mg | ORAL_TABLET | Freq: Once | ORAL | Status: AC
  Administered 2022-08-20: 4 mg via ORAL

## 2022-08-20 MED ORDER — DOXYCYCLINE HYCLATE 100 MG PO TABS
100.0000 mg | ORAL_TABLET | Freq: Once | ORAL | Status: AC
  Administered 2022-08-20: 100 mg via ORAL
  Filled 2022-08-20: qty 1

## 2022-08-20 NOTE — ED Notes (Signed)
Reviewed discharge instructions, recommendations and medications with pt. Paper script given to pt. Pt states understanding. Discharged [facility] to [facility]

## 2022-08-20 NOTE — Discharge Instructions (Signed)
You have trichomonas and I am concerned that you have STD.  You were given Rocephin to treat gonorrhea.  I have prescribed doxycycline twice a day for a week to treat chlamydia.  You should see your chlamydia and gonorrhea test result on MyChart.  If your chlamydia test is negative, you may stop taking the doxycycline  See your doctor for follow-up  Return to ER if you have worse discharge, vaginal pain or vomiting

## 2022-08-20 NOTE — ED Provider Notes (Signed)
[facility] EMERGENCY DEPARTMENT Provider Note   CSN: [id number] Arrival date & time: 08/20/22  1253     History  Chief Complaint  Patient presents with   Vaginal Discharge    Alexandra Buckley is a 51 y.o. female hx of HTN, here presenting with vaginal discharge.  Patient is sexually active about 2 weeks ago.  She states that for the last several days she noticed vaginal discharge.  She states that it is brownish in color.  Patient denies any urinary symptoms.  Denies any fevers or vomiting.  The history is provided by the patient.       Home Medications Prior to Admission medications   Medication Sig Start Date End Date Taking? Authorizing Provider  amLODipine (NORVASC) 5 MG tablet Take 1 tablet (5 mg total) by mouth daily. 08/10/22   Mayers, Cari S, PA-C  atorvastatin (LIPITOR) 10 MG tablet Take 1 tablet (10 mg total) by mouth at bedtime. 08/10/22   Mayers, Cari S, PA-C  cyclobenzaprine (FLEXERIL) 10 MG tablet Take 1 tablet (10 mg total) by mouth 3 (three) times daily as needed for muscle spasms. 08/10/22   Mayers, Cari S, PA-C  meloxicam (MOBIC) 7.5 MG tablet Take 1 tablet (7.5 mg total) by mouth daily. 08/10/22   Mayers, Cari S, PA-C      Allergies    Patient has no known allergies.    Review of Systems   Review of Systems  Genitourinary:  Positive for vaginal discharge.  All other systems reviewed and are negative.   Physical Exam Updated Vital Signs BP 134/78   Pulse 89   Temp 98.5 F (36.9 C) (Oral)   Resp 18   Ht 5\' 2"  (1.575 m)   Wt 70.3 kg   LMP 11/18/2018   SpO2 98%   BMI 28.35 kg/m  Physical Exam Vitals and nursing note reviewed.  HENT:     Head: Normocephalic.     Nose: Nose normal.     Mouth/Throat:     Mouth: Mucous membranes are moist.  Eyes:     Extraocular Movements: Extraocular movements intact.     Pupils: Pupils are equal, round, and reactive to light.  Cardiovascular:     Rate and Rhythm: Normal rate and regular rhythm.      Pulses: Normal pulses.     Heart sounds: Normal heart sounds.  Pulmonary:     Effort: Pulmonary effort is normal.     Breath sounds: Normal breath sounds.  Abdominal:     General: Abdomen is flat.     Palpations: Abdomen is soft.  Genitourinary:    Comments: Brownish discharge from os. No CMT or adnexal or uterine tenderness  Musculoskeletal:        General: Normal range of motion.     Cervical back: Normal range of motion and neck supple.  Skin:    General: Skin is warm.     Capillary Refill: Capillary refill takes less than 2 seconds.  Neurological:     General: No focal deficit present.     Mental Status: She is alert and oriented to person, place, and time.  Psychiatric:        Mood and Affect: Mood normal.        Behavior: Behavior normal.     ED Results / Procedures / Treatments   Labs (all labs ordered are listed, but only abnormal results are displayed) Labs Reviewed  WET PREP, GENITAL - Abnormal; Notable for the following components:  Result Value   Trich, Wet Prep PRESENT (*)    Clue Cells Wet Prep HPF POC PRESENT (*)    WBC, Wet Prep HPF POC >=10 (*)    All other components within normal limits  URINALYSIS, ROUTINE W REFLEX MICROSCOPIC - Abnormal; Notable for the following components:   APPearance CLOUDY (*)    Hgb urine dipstick TRACE (*)    Leukocytes,Ua SMALL (*)    All other components within normal limits  URINALYSIS, MICROSCOPIC (REFLEX) - Abnormal; Notable for the following components:   Bacteria, UA FEW (*)    Trichomonas, UA PRESENT (*)    All other components within normal limits  PREGNANCY, URINE  GC/CHLAMYDIA PROBE AMP ([place]) NOT AT [facility]    EKG None  Radiology No results found.  Procedures Procedures    Medications Ordered in ED Medications  metroNIDAZOLE (FLAGYL) tablet 2,000 mg (has no administration in time range)  doxycycline (VIBRA-TABS) tablet 100 mg (has no administration in time range)  cefTRIAXone (ROCEPHIN)  injection 500 mg (has no administration in time range)  ondansetron (ZOFRAN-ODT) disintegrating tablet 4 mg (has no administration in time range)    ED Course/ Medical Decision Making/ A&P                           Medical Decision Making Alexandra Buckley is a 51 y.o. female here presenting with vaginal discharge.  Patient was sexually active 2 weeks ago and now has vaginal discharge.  Concern for possible STDs.  We will get wet prep and GC chlamydia.  5:26 PM Urinalysis showed trichomonas in the urine.  Patient's wet prep is also positive for trichomonas.  At this point, patient will be treated with Flagyl and also doxycycline.  GC chlamydia is sent.  Patient was also given Rocephin as well.  Problems Addressed: Possible exposure to STD: acute illness or injury Trichomoniasis: acute illness or injury  Amount and/or Complexity of Data Reviewed Labs: ordered. Decision-making details documented in ED Course.  Risk Prescription drug management.    Final Clinical Impression(s) / ED Diagnoses Final diagnoses:  None    Rx / DC Orders ED Discharge Orders     None         Charlynne Pander, MD 08/20/22 1727

## 2022-08-20 NOTE — ED Triage Notes (Signed)
C/o light brown vaginal discharge x 2 days. Sexually active. Been 2 years since last menstrual. Denies urinary symptoms.

## 2022-08-21 LAB — GC/CHLAMYDIA PROBE AMP (~~LOC~~) NOT AT ARMC
Chlamydia: NEGATIVE
Comment: NEGATIVE
Comment: NORMAL
Neisseria Gonorrhea: NEGATIVE

## 2022-08-31 ENCOUNTER — Ambulatory Visit: Payer: Self-pay | Admitting: Physician Assistant

## 2022-08-31 ENCOUNTER — Encounter: Payer: Self-pay | Admitting: Physician Assistant

## 2022-08-31 VITALS — BP 145/75 | HR 93 | Ht 62.0 in | Wt 170.0 lb

## 2022-08-31 DIAGNOSIS — F5104 Psychophysiologic insomnia: Secondary | ICD-10-CM

## 2022-08-31 DIAGNOSIS — A599 Trichomoniasis, unspecified: Secondary | ICD-10-CM

## 2022-08-31 DIAGNOSIS — I10 Essential (primary) hypertension: Secondary | ICD-10-CM

## 2022-08-31 DIAGNOSIS — F3340 Major depressive disorder, recurrent, in remission, unspecified: Secondary | ICD-10-CM

## 2022-08-31 DIAGNOSIS — G8929 Other chronic pain: Secondary | ICD-10-CM

## 2022-08-31 DIAGNOSIS — F1414 Cocaine abuse with cocaine-induced mood disorder: Secondary | ICD-10-CM

## 2022-08-31 DIAGNOSIS — M25512 Pain in left shoulder: Secondary | ICD-10-CM

## 2022-08-31 MED ORDER — MELOXICAM 15 MG PO TABS: 15.0000 mg | ORAL_TABLET | Freq: Every day | ORAL | 1 refills | Status: DC

## 2022-08-31 MED ORDER — METRONIDAZOLE 500 MG PO TABS: 500.0000 mg | ORAL_TABLET | Freq: Two times a day (BID) | ORAL | 0 refills | Status: AC

## 2022-08-31 MED ORDER — CYCLOBENZAPRINE HCL 10 MG PO TABS: 10.0000 mg | ORAL_TABLET | Freq: Three times a day (TID) | ORAL | 0 refills | Status: DC | PRN

## 2022-08-31 MED ORDER — AMLODIPINE BESYLATE 10 MG PO TABS: 10.0000 mg | ORAL_TABLET | Freq: Every day | ORAL | 1 refills | Status: DC

## 2022-08-31 NOTE — Progress Notes (Unsigned)
Established Patient Office Visit  Subjective   Patient ID: Alexandra Buckley, female    DOB: 09/20/1971  Age: 51 y.o. MRN: [id number]  Chief Complaint  Patient presents with   Medication Management    States that she continues to be treated for substance abuse at [facility] residential treatment center.  States that she is working on going to long-term care at an [facility] in [place], but is hoping for an extension of her care at [facility] for another 30 days.  States that she has been checking her blood pressure on a daily basis, states that her readings have been elevated similar to today's.  States that she has been using the meloxicam on a daily basis with a small amount of relief from her chronic shoulder pain.  States that she will use the muscle relaxer when the pain is elevated.  States that this does offer modest relief.  States that she has having difficulty sleeping both falling asleep and staying asleep.  States that she has not tried anything for relief.  States that she was seen in the emergency department on November 16 for vaginal discharge.  Note from that visit     ED Course/ Medical Decision Making/ A&P                         Medical Decision Making Alexandra Buckley is a 51 y.o. female here presenting with vaginal discharge.  Patient was sexually active 2 weeks ago and now has vaginal discharge.  Concern for possible STDs.  We will get wet prep and GC chlamydia.   5:26 PM Urinalysis showed trichomonas in the urine.  Patient's wet prep is also positive for trichomonas.  At this point, patient will be treated with Flagyl and also doxycycline.  GC chlamydia is sent.  Patient was also given Rocephin as well.   Problems Addressed: Possible exposure to STD: acute illness or injury Trichomoniasis: acute illness or injury  States today that she continues to have brown discharge, denies itching.  States that she did complete the prescription for doxycycline, but has not had  any Flagyl.  Past Medical History:  Diagnosis Date   History of suicide attempt 08/05/2022   2018   Tobacco use disorder 08/05/2022   Social History   Socioeconomic History   Marital status: Legally Separated    Spouse name: Not on file   Number of children: Not on file   Years of education: Not on file   Highest education level: Not on file  Occupational History   Not on file  Tobacco Use   Smoking status: Former    Types: Cigarettes   Smokeless tobacco: Never  Vaping Use   Vaping Use: Never used  Substance and Sexual Activity   Alcohol use: No   Drug use: Never   Sexual activity: Not on file  Other Topics Concern   Not on file  Social History Narrative   Not on file   Social Determinants of Health   Financial Resource Strain: Not on file  Food Insecurity: Not on file  Transportation Needs: Not on file  Physical Activity: Not on file  Stress: Not on file  Social Connections: Not on file  Intimate Partner Violence: Not on file   History reviewed. No pertinent family history. No Known Allergies  Review of Systems  Constitutional:  Negative for chills and fever.  HENT: Negative.    Eyes: Negative.   Respiratory:  Negative for shortness  of breath.   Cardiovascular:  Negative for chest pain.  Gastrointestinal: Negative.   Genitourinary: Negative.   Musculoskeletal:  Positive for joint pain and myalgias.  Skin: Negative.   Neurological: Negative.   Endo/Heme/Allergies: Negative.   Psychiatric/Behavioral:  Negative for depression. The patient has insomnia. The patient is not nervous/anxious.       Objective:     BP (!) 145/75 (BP Location: Left Arm, Patient Position: Sitting, Cuff Size: Normal)   Pulse 93   Ht 5\' 2"  (1.575 m)   Wt 170 lb (77.1 kg)   LMP 11/18/2018   SpO2 100%   BMI 31.09 kg/m  BP Readings from Last 3 Encounters:  08/31/22 (!) 145/75  08/20/22 (!) 150/74  08/10/22 (!) 144/85   Wt Readings from Last 3 Encounters:  08/31/22 170 lb  (77.1 kg)  08/20/22 155 lb (70.3 kg)  08/10/22 148 lb (67.1 kg)      Physical Exam Vitals and nursing note reviewed.  Constitutional:      Appearance: Normal appearance.  HENT:     Head: Normocephalic and atraumatic.     Right Ear: External ear normal.     Left Ear: External ear normal.     Nose: Nose normal.     Mouth/Throat:     Mouth: Mucous membranes are moist.     Pharynx: Oropharynx is clear.  Eyes:     Extraocular Movements: Extraocular movements intact.     Conjunctiva/sclera: Conjunctivae normal.     Pupils: Pupils are equal, round, and reactive to light.  Cardiovascular:     Rate and Rhythm: Normal rate and regular rhythm.     Pulses: Normal pulses.     Heart sounds: Normal heart sounds.  Pulmonary:     Effort: Pulmonary effort is normal.     Breath sounds: Normal breath sounds.  Musculoskeletal:        General: Normal range of motion.     Cervical back: Normal range of motion and neck supple.  Skin:    General: Skin is warm and dry.  Neurological:     General: No focal deficit present.     Mental Status: She is alert and oriented to person, place, and time.  Psychiatric:        Mood and Affect: Mood normal.        Behavior: Behavior normal.        Thought Content: Thought content normal.        Judgment: Judgment normal.        Assessment & Plan:   Problem List Items Addressed This Visit       Cardiovascular and Mediastinum   Essential hypertension   Relevant Medications   amLODipine (NORVASC) 10 MG tablet     Nervous and Auditory   Cocaine abuse with cocaine-induced mood disorder (HCC)     Other   Chronic left shoulder pain   Relevant Medications   meloxicam (MOBIC) 15 MG tablet   cyclobenzaprine (FLEXERIL) 10 MG tablet   Recurrent major depressive disorder, in remission (HCC) - Primary   Psychophysiological insomnia   Other Visit Diagnoses     Trichimoniasis       Relevant Medications   metroNIDAZOLE (FLAGYL) 500 MG tablet     1.  Recurrent major depressive disorder, in remission (HCC)   2. Essential hypertension Increase amlodipine to 10 mg.  Patient encouraged to continue checking blood pressure at home, keep a written log and have available for all office visits.  Patient to  follow-up with mobile unit in 2 weeks. - amLODipine (NORVASC) 10 MG tablet; Take 1 tablet (10 mg total) by mouth daily.  Dispense: 30 tablet; Refill: 1  3. Psychophysiological insomnia Encouraged trial of melatonin over-the-counter.  Patient education given on good sleep hygiene.  4. Chronic left shoulder pain Increase Mobic 15 mg - meloxicam (MOBIC) 15 MG tablet; Take 1 tablet (15 mg total) by mouth daily.  Dispense: 30 tablet; Refill: 1 - cyclobenzaprine (FLEXERIL) 10 MG tablet; Take 1 tablet (10 mg total) by mouth 3 (three) times daily as needed for muscle spasms.  Dispense: 60 tablet; Refill: 0  5. Trichimoniasis On review of medication reconciliation chart, patient had not taken metronidazole, trial metronidazole.  Patient encouraged to continue abstinence, notify partners, test of cure in 4 weeks. - metroNIDAZOLE (FLAGYL) 500 MG tablet; Take 1 tablet (500 mg total) by mouth 2 (two) times daily for 7 days.  Dispense: 14 tablet; Refill: 0  6. Cocaine abuse with cocaine-induced mood disorder (HCC) Currently in substance abuse treatment program   I have reviewed the patient's medical history (PMH, PSH, Social History, Family History, Medications, and allergies) , and have been updated if relevant. I spent 30 minutes reviewing chart and  face to face time with patient.     Return in about 2 weeks (around 09/14/2022) for with [facility].    Kasandra Knudsen Mayers, PA-C

## 2022-08-31 NOTE — Patient Instructions (Signed)
You were treated for trichomonas, however it does not appear that you had the prescription for metronidazole.  This may be the reason you are still experiencing discharge.  You will take metronidazole twice daily for 7 days.  You should have a test of cure done in approximately 4 weeks.  To help with your left shoulder pain, you will increase meloxicam 15 mg once daily.  To help better control your blood pressure readings, you will increase amlodipine 10 mg once daily.  Continue checking your blood pressure on a daily basis, keeping a written log and bring to your next office visit.  To help with sleep, you are going to start melatonin 5 mg at bedtime as needed.  You will follow-up with the mobile unit in 2 weeks.  Kennieth Rad, PA-C Physician Assistant [facility] Medicine [email or web address]   Insomnia Insomnia is a sleep disorder that makes it difficult to fall asleep or stay asleep. Insomnia can cause fatigue, low energy, difficulty concentrating, mood swings, and poor performance at work or school. There are three different ways to classify insomnia: Difficulty falling asleep. Difficulty staying asleep. Waking up too early in the morning. Any type of insomnia can be long-term (chronic) or short-term (acute). Both are common. Short-term insomnia usually lasts for 3 months or less. Chronic insomnia occurs at least three times a week for longer than 3 months. What are the causes? Insomnia may be caused by another condition, situation, or substance, such as: Having certain mental health conditions, such as anxiety and depression. Using caffeine, alcohol, tobacco, or drugs. Having gastrointestinal conditions, such as gastroesophageal reflux disease (GERD). Having certain medical conditions. These include: Asthma. Alzheimer's disease. Stroke. Chronic pain. An overactive thyroid gland (hyperthyroidism). Other sleep disorders, such as  restless legs syndrome and sleep apnea. Menopause. Sometimes, the cause of insomnia may not be known. What increases the risk? Risk factors for insomnia include: Gender. Females are affected more often than males. Age. Insomnia is more common as people get older. Stress and certain medical and mental health conditions. Lack of exercise. Having an irregular work schedule. This may include working night shifts and traveling between different time zones. What are the signs or symptoms? If you have insomnia, the main symptom is having trouble falling asleep or having trouble staying asleep. This may lead to other symptoms, such as: Feeling tired or having low energy. Feeling nervous about going to sleep. Not feeling rested in the morning. Having trouble concentrating. Feeling irritable, anxious, or depressed. How is this diagnosed? This condition may be diagnosed based on: Your symptoms and medical history. Your health care provider may ask about: Your sleep habits. Any medical conditions you have. Your mental health. A physical exam. How is this treated? Treatment for insomnia depends on the cause. Treatment may focus on treating an underlying condition that is causing the insomnia. Treatment may also include: Medicines to help you sleep. Counseling or therapy. Lifestyle adjustments to help you sleep better. Follow these instructions at home: Eating and drinking  Limit or avoid alcohol, caffeinated beverages, and products that contain nicotine and tobacco, especially close to bedtime. These can disrupt your sleep. Do not eat a large meal or eat spicy foods right before bedtime. This can lead to digestive discomfort that can make it hard for you to sleep. Sleep habits  Keep a sleep diary to help you and your health care provider figure out what could be causing your insomnia. Write down: When you sleep. When you wake up  during the night. How well you sleep and how rested you feel  the next day. Any side effects of medicines you are taking. What you eat and drink. Make your bedroom a dark, comfortable place where it is easy to fall asleep. Put up shades or blackout curtains to block light from outside. Use a white noise machine to block noise. Keep the temperature cool. Limit screen use before bedtime. This includes: Not watching TV. Not using your smartphone, tablet, or computer. Stick to a routine that includes going to bed and waking up at the same times every day and night. This can help you fall asleep faster. Consider making a quiet activity, such as reading, part of your nighttime routine. Try to avoid taking naps during the day so that you sleep better at night. Get out of bed if you are still awake after 15 minutes of trying to sleep. Keep the lights down, but try reading or doing a quiet activity. When you feel sleepy, go back to bed. General instructions Take over-the-counter and prescription medicines only as told by your health care provider. Exercise regularly as told by your health care provider. However, avoid exercising in the hours right before bedtime. Use relaxation techniques to manage stress. Ask your health care provider to suggest some techniques that may work well for you. These may include: Breathing exercises. Routines to release muscle tension. Visualizing peaceful scenes. Make sure that you drive carefully. Do not drive if you feel very sleepy. Keep all follow-up visits. This is important. Contact a health care provider if: You are tired throughout the day. You have trouble in your daily routine due to sleepiness. You continue to have sleep problems, or your sleep problems get worse. Get help right away if: You have thoughts about hurting yourself or someone else. Get help right away if you feel like you may hurt yourself or others, or have thoughts about taking your own life. Go to your nearest emergency room or: Call 911. Call the  National Suicide Prevention Lifeline at 925-762-4009 or 988. This is open 24 hours a day. Text the Crisis Text Line at 718-805-5738. Summary Insomnia is a sleep disorder that makes it difficult to fall asleep or stay asleep. Insomnia can be long-term (chronic) or short-term (acute). Treatment for insomnia depends on the cause. Treatment may focus on treating an underlying condition that is causing the insomnia. Keep a sleep diary to help you and your health care provider figure out what could be causing your insomnia. This information is not intended to replace advice given to you by your health care provider. Make sure you discuss any questions you have with your health care provider. Document Revised: 09/01/2021 Document Reviewed: 09/01/2021 Elsevier Patient Education  2023 [employer].

## 2022-09-14 ENCOUNTER — Encounter: Payer: Self-pay | Admitting: Physician Assistant

## 2022-09-14 ENCOUNTER — Ambulatory Visit: Payer: Medicaid Other | Admitting: Physician Assistant

## 2022-09-14 VITALS — BP 143/81 | HR 103 | Ht 62.4 in | Wt 182.0 lb

## 2022-09-14 DIAGNOSIS — I10 Essential (primary) hypertension: Secondary | ICD-10-CM

## 2022-09-14 DIAGNOSIS — A599 Trichomoniasis, unspecified: Secondary | ICD-10-CM

## 2022-09-14 DIAGNOSIS — F5104 Psychophysiologic insomnia: Secondary | ICD-10-CM | POA: Diagnosis not present

## 2022-09-14 DIAGNOSIS — Z23 Encounter for immunization: Secondary | ICD-10-CM

## 2022-09-14 DIAGNOSIS — F1414 Cocaine abuse with cocaine-induced mood disorder: Secondary | ICD-10-CM | POA: Diagnosis not present

## 2022-09-14 DIAGNOSIS — G8929 Other chronic pain: Secondary | ICD-10-CM

## 2022-09-14 DIAGNOSIS — M25562 Pain in left knee: Secondary | ICD-10-CM

## 2022-09-14 DIAGNOSIS — M25561 Pain in right knee: Secondary | ICD-10-CM

## 2022-09-14 DIAGNOSIS — F3340 Major depressive disorder, recurrent, in remission, unspecified: Secondary | ICD-10-CM | POA: Diagnosis not present

## 2022-09-14 DIAGNOSIS — M545 Low back pain, unspecified: Secondary | ICD-10-CM

## 2022-09-14 MED ORDER — HYDROCHLOROTHIAZIDE 25 MG PO TABS: 25.0000 mg | ORAL_TABLET | Freq: Every morning | ORAL | 1 refills | Status: DC

## 2022-09-14 MED ORDER — HYDROXYZINE HCL 25 MG PO TABS: 25.0000 mg | ORAL_TABLET | Freq: Every evening | ORAL | 1 refills | Status: DC | PRN

## 2022-09-14 MED ORDER — AMLODIPINE BESYLATE 5 MG PO TABS: 5.0000 mg | ORAL_TABLET | Freq: Every day | ORAL | 1 refills | Status: DC

## 2022-09-14 NOTE — Progress Notes (Unsigned)
   Established Patient Office Visit  Subjective   Patient ID: Alexandra Buckley, female    DOB: 1971-05-15  Age: 51 y.o. MRN: [id number]  No chief complaint on file.   [facility] Aftercare - got extension till end of December      1. Recurrent major depressive disorder, in remission (HCC)     2. Essential hypertension Increase amlodipine to 10 mg.  Patient encouraged to continue checking blood pressure at home, keep a written log and have available for all office visits.   Patient to follow-up with mobile unit in 2 weeks.   Ankles puffy worse in past 2 weeks  Knees hurt in both knees - - helps to elated when sitting  B/p elevated similar to todat - slightly higher  - amLODipine (NORVASC) 10 MG tablet; Take 1 tablet (10 mg total) by mouth daily.  Dispense: 30 tablet; Refill: 1   3. Psychophysiological insomnia  Tried melatonin - still having with both  Encouraged trial of melatonin over-the-counter.  Patient education given on good sleep hygiene.   4. Chronic left shoulder pain Increase Mobic 15 mg Does help -shoulder but not knee  - meloxicam (MOBIC) 15 MG tablet; Take 1 tablet (15 mg total) by mouth daily.  Dispense: 30 tablet; Refill: 1 - cyclobenzaprine (FLEXERIL) 10 MG tablet; Take 1 tablet (10 mg total) by mouth 3 (three) times daily as needed for muscle spasms.  Dispense: 60 tablet; Refill: 0   5. Trichimoniasis On review of medication reconciliation chart, patient had not taken metronidazole, trial metronidazole.  Patient encouraged to continue abstinence, notify partners, test of cure in 4 weeks.  Cleared it up   - metroNIDAZOLE (FLAGYL) 500 MG tablet; Take 1 tablet (500 mg total) by mouth 2 (two) times daily for 7 days.  Dispense: 14 tablet; Refill: 0   6. Cocaine abuse with cocaine-induced mood disorder (HCC) Currently in substance abuse treatment program     {History (Optional):23778}  ROS    Objective:     LMP 11/18/2018  {Vitals History  (Optional):23777}  Physical Exam   No results found for any visits on 09/14/22.  {Labs (Optional):23779}  The 10-year ASCVD risk score (Arnett DK, et al., 2019) is: 6.2%    Assessment & Plan:   Problem List Items Addressed This Visit   None   No follow-ups on file.    Kasandra Knudsen Mayers, PA-C

## 2022-09-14 NOTE — Patient Instructions (Signed)
To help control your blood pressure, and eliminate side effect of puffy ankles, you will decrease amlodipine 5 mg, and start hydrochlorothiazide 25 mg once daily.  I encourage you to continue checking your blood pressure on a daily basis, keeping a written log and having available for all office visits.  To help with sleep, you will do trial of hydroxyzine 25 mg at bedtime as needed.  It is okay to continue melatonin if needed.  It is okay to have feet elevated to help with bilateral knee pain and ankle puffiness as needed.  Roney Jaffe, PA-C Physician Assistant [facility] Medicine [email or web address]  Insomnia Insomnia is a sleep disorder that makes it difficult to fall asleep or stay asleep. Insomnia can cause fatigue, low energy, difficulty concentrating, mood swings, and poor performance at work or school. There are three different ways to classify insomnia: Difficulty falling asleep. Difficulty staying asleep. Waking up too early in the morning. Any type of insomnia can be long-term (chronic) or short-term (acute). Both are common. Short-term insomnia usually lasts for 3 months or less. Chronic insomnia occurs at least three times a week for longer than 3 months. What are the causes? Insomnia may be caused by another condition, situation, or substance, such as: Having certain mental health conditions, such as anxiety and depression. Using caffeine, alcohol, tobacco, or drugs. Having gastrointestinal conditions, such as gastroesophageal reflux disease (GERD). Having certain medical conditions. These include: Asthma. Alzheimer's disease. Stroke. Chronic pain. An overactive thyroid gland (hyperthyroidism). Other sleep disorders, such as restless legs syndrome and sleep apnea. Menopause. Sometimes, the cause of insomnia may not be known. What increases the risk? Risk factors for insomnia include: Gender. Females are affected more  often than males. Age. Insomnia is more common as people get older. Stress and certain medical and mental health conditions. Lack of exercise. Having an irregular work schedule. This may include working night shifts and traveling between different time zones. What are the signs or symptoms? If you have insomnia, the main symptom is having trouble falling asleep or having trouble staying asleep. This may lead to other symptoms, such as: Feeling tired or having low energy. Feeling nervous about going to sleep. Not feeling rested in the morning. Having trouble concentrating. Feeling irritable, anxious, or depressed. How is this diagnosed? This condition may be diagnosed based on: Your symptoms and medical history. Your health care provider may ask about: Your sleep habits. Any medical conditions you have. Your mental health. A physical exam. How is this treated? Treatment for insomnia depends on the cause. Treatment may focus on treating an underlying condition that is causing the insomnia. Treatment may also include: Medicines to help you sleep. Counseling or therapy. Lifestyle adjustments to help you sleep better. Follow these instructions at home: Eating and drinking  Limit or avoid alcohol, caffeinated beverages, and products that contain nicotine and tobacco, especially close to bedtime. These can disrupt your sleep. Do not eat a large meal or eat spicy foods right before bedtime. This can lead to digestive discomfort that can make it hard for you to sleep. Sleep habits  Keep a sleep diary to help you and your health care provider figure out what could be causing your insomnia. Write down: When you sleep. When you wake up during the night. How well you sleep and how rested you feel the next day. Any side effects of medicines you are taking. What you eat and drink. Make your bedroom a dark, comfortable place where it is  easy to fall asleep. Put up shades or blackout curtains to  block light from outside. Use a white noise machine to block noise. Keep the temperature cool. Limit screen use before bedtime. This includes: Not watching TV. Not using your smartphone, tablet, or computer. Stick to a routine that includes going to bed and waking up at the same times every day and night. This can help you fall asleep faster. Consider making a quiet activity, such as reading, part of your nighttime routine. Try to avoid taking naps during the day so that you sleep better at night. Get out of bed if you are still awake after 15 minutes of trying to sleep. Keep the lights down, but try reading or doing a quiet activity. When you feel sleepy, go back to bed. General instructions Take over-the-counter and prescription medicines only as told by your health care provider. Exercise regularly as told by your health care provider. However, avoid exercising in the hours right before bedtime. Use relaxation techniques to manage stress. Ask your health care provider to suggest some techniques that may work well for you. These may include: Breathing exercises. Routines to release muscle tension. Visualizing peaceful scenes. Make sure that you drive carefully. Do not drive if you feel very sleepy. Keep all follow-up visits. This is important. Contact a health care provider if: You are tired throughout the day. You have trouble in your daily routine due to sleepiness. You continue to have sleep problems, or your sleep problems get worse. Get help right away if: You have thoughts about hurting yourself or someone else. Get help right away if you feel like you may hurt yourself or others, or have thoughts about taking your own life. Go to your nearest emergency room or: Call 911. Call the National Suicide Prevention Lifeline at (701)471-0359 or 988. This is open 24 hours a day. Text the Crisis Text Line at 913-721-4459. Summary Insomnia is a sleep disorder that makes it difficult to fall  asleep or stay asleep. Insomnia can be long-term (chronic) or short-term (acute). Treatment for insomnia depends on the cause. Treatment may focus on treating an underlying condition that is causing the insomnia. Keep a sleep diary to help you and your health care provider figure out what could be causing your insomnia. This information is not intended to replace advice given to you by your health care provider. Make sure you discuss any questions you have with your health care provider. Document Revised: 09/01/2021 Document Reviewed: 09/01/2021 Elsevier Patient Education  2023 [employer].

## 2022-09-15 ENCOUNTER — Encounter: Payer: Self-pay | Admitting: Physician Assistant

## 2022-09-30 ENCOUNTER — Other Ambulatory Visit: Payer: Self-pay | Admitting: Physician Assistant

## 2022-09-30 DIAGNOSIS — E782 Mixed hyperlipidemia: Secondary | ICD-10-CM

## 2022-09-30 DIAGNOSIS — G8929 Other chronic pain: Secondary | ICD-10-CM

## 2022-09-30 MED ORDER — CYCLOBENZAPRINE HCL 10 MG PO TABS: 10.0000 mg | ORAL_TABLET | Freq: Three times a day (TID) | ORAL | 0 refills | Status: DC | PRN

## 2022-10-07 ENCOUNTER — Other Ambulatory Visit: Payer: Self-pay | Admitting: Physician Assistant

## 2022-10-07 ENCOUNTER — Ambulatory Visit: Payer: Medicaid Other | Admitting: Physician Assistant

## 2022-10-07 DIAGNOSIS — G8929 Other chronic pain: Secondary | ICD-10-CM

## 2022-10-07 DIAGNOSIS — Z8619 Personal history of other infectious and parasitic diseases: Secondary | ICD-10-CM

## 2022-10-07 DIAGNOSIS — F5104 Psychophysiologic insomnia: Secondary | ICD-10-CM

## 2022-10-07 DIAGNOSIS — I10 Essential (primary) hypertension: Secondary | ICD-10-CM

## 2022-10-07 DIAGNOSIS — F3340 Major depressive disorder, recurrent, in remission, unspecified: Secondary | ICD-10-CM | POA: Diagnosis not present

## 2022-10-07 DIAGNOSIS — N898 Other specified noninflammatory disorders of vagina: Secondary | ICD-10-CM | POA: Diagnosis not present

## 2022-10-07 NOTE — Progress Notes (Unsigned)
Established Patient Office Visit  Subjective   Patient ID: Alexandra Buckley, female    DOB: 02-10-1971  Age: 52 y.o. MRN: [id number]  No chief complaint on file.    States that she continues to be treated for substance abuse at [facility] residential treatment center.  States that she did receive an extension and will continue at [facility] until the end of December, has not made any arrangements for aftercare as of yet.  January 31st - going home in [facility]   Friday or saturda was speaking with courtney Discharge - grey, no odor, no itching or burning - started Friday      States that she has been noticing both ankles having puffiness since the increase of amlodipine.  States that she has been checking her blood pressure on a daily basis, states readings have elevated, similar to today or slightly higher.   States that she does have chronic bilateral knee pain that she endorses comes from arthritis.  States that she would like to be able to keep her feet elevated when sitting for long.'s, states that this is offered relief in the past, states she does require medical providers permission to do this while she is at [facility] residential treatment center.  Does endorse that the increase in Mobic did offer relief from her chronic shoulder pain.   States that she is still having difficulty both falling asleep and staying asleep, despite trial of melatonin.     States that she did complete the treatment of metronidazole, denies any vaginal discharge.   1. Recurrent major depressive disorder, in remission ([place]) Continue current regimen   2. Psychophysiological insomnia Trial hydroxyzine.  Patient education given on good sleep hygiene - hydrOXYzine (ATARAX) 25 MG tablet; Take 1 tablet (25 mg total) by mouth at bedtime as needed.  Dispense: 30 tablet; Refill: 1  Sleep is much better  - melatonin and hydroxyzine    3. Essential hypertension Reduce amlodipine 5 mg due to possible adverse  effect of bilateral ankle edema.  Trial hydrochlorothiazide 25 mg.  Patient encouraged to continue checking blood pressure on a daily basis, keeping a written log and having available for all office visits - amLODipine (NORVASC) 5 MG tablet; Take 1 tablet (5 mg total) by mouth daily.  Dispense: 30 tablet; Refill: 1 - hydrochlorothiazide (HYDRODIURIL) 25 MG tablet; Take 1 tablet (25 mg total) by mouth every morning.  Dispense: 30 tablet; Refill: 1   No more ankle puffiness -  Having elevated readings -  Is having pain in her lower back Later In the day to check BP when pain is better   4. Elevated blood pressure reading in office with diagnosis of hypertension Red flags given for prompt reevaluation   5. Bilateral chronic knee pain Letter written on patient's behalf to allow elevation of feet as needed   Does help   6. Need for Tdap vaccination   - Tdap vaccine greater than or equal to 7yo IM   7. Cocaine abuse with cocaine-induced mood disorder ([place]) Currently in substance abuse treatment program   8. Trichimoniasis Patient education given on test of cure, will retest patient in 2-week follow-up visit         {History (Optional):23778}  ROS    Objective:     LMP 11/18/2018  {Vitals History (Optional):23777}  Physical Exam   No results found for any visits on 10/07/22.  {Labs (Optional):23779}  The 10-year ASCVD risk score (Arnett DK, et al., 2019) is: 5.9%  Assessment & Plan:   Problem List Items Addressed This Visit   None   No follow-ups on file.    Loraine Grip Mayers, PA-C

## 2022-10-07 NOTE — Patient Instructions (Addendum)
I encourage you to continue checking your blood pressure on a daily basis, however move your blood pressure readings to lunchtime to help determine if your blood pressure readings are elevated due to waking up with pain.  We will call you with today's lab results.  You will follow-up with the mobile unit in 2 weeks.  Kennieth Rad, PA-C Physician Assistant [facility] Medicine [email or web address]   Low-Sodium Eating Plan Sodium, which is an element that makes up salt, helps you maintain a healthy balance of fluids in your body. Too much sodium can increase your blood pressure and cause fluid and waste to be held in your body. Your health care provider or dietitian may recommend following this plan if you have high blood pressure (hypertension), kidney disease, liver disease, or heart failure. Eating less sodium can help lower your blood pressure, reduce swelling, and protect your heart, liver, and kidneys. What are tips for following this plan? Reading food labels The Nutrition Facts label lists the amount of sodium in one serving of the food. If you eat more than one serving, you must multiply the listed amount of sodium by the number of servings. Choose foods with less than 140 mg of sodium per serving. Avoid foods with 300 mg of sodium or more per serving. Shopping  Look for lower-sodium products, often labeled as "low-sodium" or "no salt added." Always check the sodium content, even if foods are labeled as "unsalted" or "no salt added." Buy fresh foods. Avoid canned foods and pre-made or frozen meals. Avoid canned, cured, or processed meats. Buy breads that have less than 80 mg of sodium per slice. Cooking  Eat more home-cooked food and less restaurant, buffet, and fast food. Avoid adding salt when cooking. Use salt-free seasonings or herbs instead of table salt or sea salt. Check with your health care provider or pharmacist before  using salt substitutes. Cook with plant-based oils, such as canola, sunflower, or olive oil. Meal planning When eating at a restaurant, ask that your food be prepared with less salt or no salt, if possible. Avoid dishes labeled as brined, pickled, cured, smoked, or made with soy sauce, miso, or teriyaki sauce. Avoid foods that contain MSG (monosodium glutamate). MSG is sometimes added to [place] food, bouillon, and some canned foods. Make meals that can be grilled, baked, poached, roasted, or steamed. These are generally made with less sodium. General information Most people on this plan should limit their sodium intake to 1,500-2,000 mg (milligrams) of sodium each day. What foods should I eat? Fruits Fresh, frozen, or canned fruit. Fruit juice. Vegetables Fresh or frozen vegetables. "No salt added" canned vegetables. "No salt added" tomato sauce and paste. Low-sodium or reduced-sodium tomato and vegetable juice. Grains Low-sodium cereals, including oats, puffed wheat and rice, and shredded wheat. Low-sodium crackers. Unsalted rice. Unsalted pasta. Low-sodium bread. Whole-grain breads and whole-grain pasta. Meats and other proteins Fresh or frozen (no salt added) meat, poultry, seafood, and fish. Low-sodium canned tuna and salmon. Unsalted nuts. Dried peas, beans, and lentils without added salt. Unsalted canned beans. Eggs. Unsalted nut butters. Dairy Milk. Soy milk. Cheese that is naturally low in sodium, such as ricotta cheese, fresh mozzarella, or Swiss cheese. Low-sodium or reduced-sodium cheese. Cream cheese. Yogurt. Seasonings and condiments Fresh and dried herbs and spices. Salt-free seasonings. Low-sodium mustard and ketchup. Sodium-free salad dressing. Sodium-free light mayonnaise. Fresh or refrigerated horseradish. Lemon juice. Vinegar. Other foods Homemade, reduced-sodium, or low-sodium soups. Unsalted popcorn and pretzels. Low-salt or salt-free chips.  The items listed above may not  be a complete list of foods and beverages you can eat. Contact a dietitian for more information. What foods should I avoid? Vegetables Sauerkraut, pickled vegetables, and relishes. Olives. [place] fries. Onion rings. Regular canned vegetables (not low-sodium or reduced-sodium). Regular canned tomato sauce and paste (not low-sodium or reduced-sodium). Regular tomato and vegetable juice (not low-sodium or reduced-sodium). Frozen vegetables in sauces. Grains Instant hot cereals. Bread stuffing, pancake, and biscuit mixes. Croutons. Seasoned rice or pasta mixes. Noodle soup cups. Boxed or frozen macaroni and cheese. Regular salted crackers. Self-rising flour. Meats and other proteins Meat or fish that is salted, canned, smoked, spiced, or pickled. Precooked or cured meat, such as sausages or meat loaves. Berniece Salines. Ham. Pepperoni. Hot dogs. Corned beef. Chipped beef. Salt pork. Jerky. Pickled herring. Anchovies and sardines. Regular canned tuna. Salted nuts. Dairy Processed cheese and cheese spreads. Hard cheeses. Cheese curds. Blue cheese. Feta cheese. String cheese. Regular cottage cheese. Buttermilk. Canned milk. Fats and oils Salted butter. Regular margarine. Ghee. Bacon fat. Seasonings and condiments Onion salt, garlic salt, seasoned salt, table salt, and sea salt. Canned and packaged gravies. Worcestershire sauce. Tartar sauce. Barbecue sauce. Teriyaki sauce. Soy sauce, including reduced-sodium. Steak sauce. Fish sauce. Oyster sauce. Cocktail sauce. Horseradish that you find on the shelf. Regular ketchup and mustard. Meat flavorings and tenderizers. Bouillon cubes. Hot sauce. Pre-made or packaged marinades. Pre-made or packaged taco seasonings. Relishes. Regular salad dressings. Salsa. Other foods Salted popcorn and pretzels. Corn chips and puffs. Potato and tortilla chips. Canned or dried soups. Pizza. Frozen entrees and pot pies. The items listed above may not be a complete list of foods and beverages  you should avoid. Contact a dietitian for more information. Summary Eating less sodium can help lower your blood pressure, reduce swelling, and protect your heart, liver, and kidneys. Most people on this plan should limit their sodium intake to 1,500-2,000 mg (milligrams) of sodium each day. Canned, boxed, and frozen foods are high in sodium. Restaurant foods, fast foods, and pizza are also very high in sodium. You also get sodium by adding salt to food. Try to cook at home, eat more fresh fruits and vegetables, and eat less fast food and canned, processed, or prepared foods. This information is not intended to replace advice given to you by your health care provider. Make sure you discuss any questions you have with your health care provider. Document Revised: 10/27/2019 Document Reviewed: 08/23/2019 Elsevier Patient Education  [place].

## 2022-10-08 ENCOUNTER — Encounter: Payer: Self-pay | Admitting: Physician Assistant

## 2022-10-08 DIAGNOSIS — Z8619 Personal history of other infectious and parasitic diseases: Secondary | ICD-10-CM | POA: Insufficient documentation

## 2022-10-09 LAB — SPECIMEN STATUS REPORT

## 2022-10-12 LAB — NUSWAB VAGINITIS PLUS (VG+)
Candida albicans, NAA: NEGATIVE
Candida glabrata, NAA: NEGATIVE
Chlamydia trachomatis, NAA: NEGATIVE
Neisseria gonorrhoeae, NAA: NEGATIVE
Trich vag by NAA: NEGATIVE

## 2022-10-12 LAB — SPECIMEN STATUS REPORT

## 2023-05-19 ENCOUNTER — Inpatient Hospital Stay (HOSPITAL_COMMUNITY)
Admission: EM | Admit: 2023-05-19 | Discharge: 2023-05-21 | DRG: 065 | Disposition: A | Discharge status: Home Health | Payer: Self-pay | Attending: Student | Admitting: Student

## 2023-05-19 ENCOUNTER — Emergency Department (HOSPITAL_COMMUNITY): Payer: Self-pay

## 2023-05-19 ENCOUNTER — Other Ambulatory Visit: Payer: Self-pay

## 2023-05-19 ENCOUNTER — Inpatient Hospital Stay (HOSPITAL_COMMUNITY): Payer: Self-pay

## 2023-05-19 DIAGNOSIS — I639 Cerebral infarction, unspecified: Secondary | ICD-10-CM

## 2023-05-19 DIAGNOSIS — Z79899 Other long term (current) drug therapy: Secondary | ICD-10-CM

## 2023-05-19 DIAGNOSIS — I10 Essential (primary) hypertension: Secondary | ICD-10-CM | POA: Diagnosis present

## 2023-05-19 DIAGNOSIS — S0990XA Unspecified injury of head, initial encounter: Secondary | ICD-10-CM

## 2023-05-19 DIAGNOSIS — R7303 Prediabetes: Secondary | ICD-10-CM | POA: Diagnosis present

## 2023-05-19 DIAGNOSIS — I6381 Other cerebral infarction due to occlusion or stenosis of small artery: Principal | ICD-10-CM | POA: Diagnosis present

## 2023-05-19 DIAGNOSIS — Z5902 Unsheltered homelessness: Secondary | ICD-10-CM

## 2023-05-19 DIAGNOSIS — F1410 Cocaine abuse, uncomplicated: Secondary | ICD-10-CM | POA: Diagnosis present

## 2023-05-19 DIAGNOSIS — R29701 NIHSS score 1: Secondary | ICD-10-CM | POA: Diagnosis present

## 2023-05-19 DIAGNOSIS — W19XXXA Unspecified fall, initial encounter: Principal | ICD-10-CM

## 2023-05-19 DIAGNOSIS — R531 Weakness: Secondary | ICD-10-CM | POA: Diagnosis present

## 2023-05-19 DIAGNOSIS — E785 Hyperlipidemia, unspecified: Secondary | ICD-10-CM | POA: Diagnosis present

## 2023-05-19 DIAGNOSIS — R400 Somnolence: Secondary | ICD-10-CM | POA: Diagnosis present

## 2023-05-19 DIAGNOSIS — F17210 Nicotine dependence, cigarettes, uncomplicated: Secondary | ICD-10-CM | POA: Diagnosis present

## 2023-05-19 DIAGNOSIS — R1310 Dysphagia, unspecified: Secondary | ICD-10-CM | POA: Diagnosis present

## 2023-05-19 DIAGNOSIS — I471 Supraventricular tachycardia, unspecified: Secondary | ICD-10-CM | POA: Diagnosis not present

## 2023-05-19 LAB — CBC WITH DIFFERENTIAL/PLATELET
Abs Immature Granulocytes: 0.01 10*3/uL (ref 0.00–0.07)
Basophils Absolute: 0 10*3/uL (ref 0.0–0.1)
Basophils Relative: 0 %
Eosinophils Absolute: 0.1 10*3/uL (ref 0.0–0.5)
Eosinophils Relative: 2 %
HCT: 46.3 % — ABNORMAL HIGH (ref 36.0–46.0)
Hemoglobin: 14.3 g/dL (ref 12.0–15.0)
Immature Granulocytes: 0 %
Lymphocytes Relative: 39 %
Lymphs Abs: 2.4 10*3/uL (ref 0.7–4.0)
MCH: 28.3 pg (ref 26.0–34.0)
MCHC: 30.9 g/dL (ref 30.0–36.0)
MCV: 91.5 fL (ref 80.0–100.0)
Monocytes Absolute: 0.4 10*3/uL (ref 0.1–1.0)
Monocytes Relative: 6 %
Neutro Abs: 3.2 10*3/uL (ref 1.7–7.7)
Neutrophils Relative %: 53 %
Platelets: 272 10*3/uL (ref 150–400)
RBC: 5.06 MIL/uL (ref 3.87–5.11)
RDW: 14.6 % (ref 11.5–15.5)
WBC: 6.1 10*3/uL (ref 4.0–10.5)
nRBC: 0 % (ref 0.0–0.2)

## 2023-05-19 LAB — HEMOGLOBIN A1C
Hgb A1c MFr Bld: 5.9 % — ABNORMAL HIGH (ref 4.8–5.6)
Mean Plasma Glucose: 122.63 mg/dL

## 2023-05-19 LAB — COMPREHENSIVE METABOLIC PANEL
ALT: 18 U/L (ref 0–44)
AST: 16 U/L (ref 15–41)
Albumin: 4.3 g/dL (ref 3.5–5.0)
Alkaline Phosphatase: 111 U/L (ref 38–126)
Anion gap: 9 (ref 5–15)
BUN: 11 mg/dL (ref 6–20)
CO2: 26 mmol/L (ref 22–32)
Calcium: 9.2 mg/dL (ref 8.9–10.3)
Chloride: 104 mmol/L (ref 98–111)
Creatinine, Ser: 0.73 mg/dL (ref 0.44–1.00)
GFR, Estimated: >60 mL/min (ref >60)
Glucose, Bld: 87 mg/dL (ref 70–99)
Potassium: 3.4 mmol/L — ABNORMAL LOW (ref 3.5–5.1)
Sodium: 139 mmol/L (ref 135–145)
Total Bilirubin: 0.5 mg/dL (ref 0.3–1.2)
Total Protein: 8 g/dL (ref 6.5–8.1)

## 2023-05-19 LAB — SALICYLATE LEVEL: Salicylate Lvl: <7 mg/dL — ABNORMAL LOW (ref 7.0–30.0)

## 2023-05-19 LAB — HIV ANTIBODY (ROUTINE TESTING W REFLEX): HIV Screen 4th Generation wRfx: NONREACTIVE

## 2023-05-19 LAB — HCG, SERUM, QUALITATIVE: Preg, Serum: NEGATIVE

## 2023-05-19 LAB — ETHANOL: Alcohol, Ethyl (B): <10 mg/dL (ref <10)

## 2023-05-19 MED ORDER — ENOXAPARIN SODIUM 40 MG/0.4ML IJ SOSY
40.0000 mg | PREFILLED_SYRINGE | INTRAMUSCULAR | Status: DC
  Administered 2023-05-19 – 2023-05-20 (×2): 40 mg via SUBCUTANEOUS
  Filled 2023-05-19 (×2): qty 0.4

## 2023-05-19 MED ORDER — STROKE: EARLY STAGES OF RECOVERY BOOK
Freq: Once | Status: AC
  Filled 2023-05-19: qty 1

## 2023-05-19 MED ORDER — ACETAMINOPHEN 325 MG PO TABS: 650.0000 mg | ORAL_TABLET | Freq: Four times a day (QID) | ORAL | Status: DC | PRN

## 2023-05-19 MED ORDER — GADOBUTROL 1 MMOL/ML IV SOLN
6.0000 mL | Freq: Once | INTRAVENOUS | Status: AC | PRN
  Administered 2023-05-19: 6 mL via INTRAVENOUS

## 2023-05-19 MED ORDER — ASPIRIN 81 MG PO TBEC
81.0000 mg | DELAYED_RELEASE_TABLET | Freq: Every day | ORAL | Status: DC
  Administered 2023-05-20 – 2023-05-21 (×2): 81 mg via ORAL
  Filled 2023-05-19 (×2): qty 1

## 2023-05-19 MED ORDER — LORAZEPAM 2 MG/ML IJ SOLN: 1.0000 mg | Freq: Once | INTRAMUSCULAR | Status: DC

## 2023-05-19 MED ORDER — CLOPIDOGREL BISULFATE 75 MG PO TABS
75.0000 mg | ORAL_TABLET | Freq: Every day | ORAL | Status: DC
  Administered 2023-05-19 – 2023-05-21 (×3): 75 mg via ORAL
  Filled 2023-05-19 (×3): qty 1

## 2023-05-19 MED ORDER — IOHEXOL 350 MG/ML SOLN
75.0000 mL | Freq: Once | INTRAVENOUS | Status: AC | PRN
  Administered 2023-05-19: 75 mL via INTRAVENOUS

## 2023-05-19 MED ORDER — ASPIRIN 325 MG PO TABS
325.0000 mg | ORAL_TABLET | Freq: Every day | ORAL | Status: DC
  Administered 2023-05-19: 325 mg via ORAL
  Filled 2023-05-19 (×2): qty 1

## 2023-05-19 MED ORDER — ONDANSETRON HCL 4 MG/2ML IJ SOLN
4.0000 mg | Freq: Once | INTRAMUSCULAR | Status: AC
  Administered 2023-05-19: 4 mg via INTRAVENOUS
  Filled 2023-05-19: qty 2

## 2023-05-19 MED ORDER — HYDRALAZINE HCL 20 MG/ML IJ SOLN: 5.0000 mg | Freq: Four times a day (QID) | INTRAMUSCULAR | Status: DC | PRN

## 2023-05-19 MED ORDER — ONDANSETRON HCL 4 MG/2ML IJ SOLN: 4.0000 mg | Freq: Four times a day (QID) | INTRAMUSCULAR | Status: DC | PRN

## 2023-05-19 MED ORDER — ACETAMINOPHEN 650 MG RE SUPP: 650.0000 mg | Freq: Four times a day (QID) | RECTAL | Status: DC | PRN

## 2023-05-19 MED ORDER — NALOXONE HCL 0.4 MG/ML IJ SOLN
0.2000 mg | INTRAMUSCULAR | Status: DC | PRN
  Administered 2023-05-19: 0.2 mg via INTRAVENOUS
  Filled 2023-05-19: qty 1

## 2023-05-19 MED ORDER — NALOXONE HCL 0.4 MG/ML IJ SOLN
0.2000 mg | Freq: Once | INTRAMUSCULAR | Status: AC
  Administered 2023-05-19: 0.2 mg via INTRAVENOUS
  Filled 2023-05-19: qty 1

## 2023-05-19 MED ORDER — ONDANSETRON HCL 4 MG PO TABS: 4.0000 mg | ORAL_TABLET | Freq: Four times a day (QID) | ORAL | Status: DC | PRN

## 2023-05-19 NOTE — ED Provider Notes (Addendum)
 [place] EMERGENCY DEPARTMENT AT [facility] Provider Note   CSN: [id number] Arrival date & time: 05/19/23  9360     History  Chief Complaint  Patient presents with   [place]    Alexandra Buckley is a 52 yo female.  52 year old female with no PMH presents to the ED brought in via EMS with a chief complaint of fall.  According to EMS report, patient was found on the ground, had a fall according to a bystander.  Patient did report hitting the back of her head but denies loss of consciousness.  There is some concern for history of drug use prior to this incident. Patient uncooperative with interview,level 5 caveat due to mental status.   Fall Pertinent negatives include no chest pain, no abdominal pain and no shortness of breath.       Home Medications Prior to Admission medications   Not on File      Allergies    Patient has no known allergies.    Review of Systems   Review of Systems  Constitutional:  Negative for fever.  Respiratory:  Negative for shortness of breath.   Cardiovascular:  Negative for chest pain.  Gastrointestinal:  Negative for abdominal pain.  All other systems reviewed and are negative.   Physical Exam Updated Vital Signs BP (!) 176/88 (BP Location: Left Arm)   Pulse (!) 51   Temp 97.7 F (36.5 C) (Oral)   Resp 11   Ht 5' 2 (1.575 m)   Wt 63.5 kg   SpO2 100%   BMI 25.61 kg/m  Physical Exam Vitals and nursing note reviewed.  Constitutional:      Comments: Sleeping during evaluation.   HENT:     Head: Normocephalic.     Comments: No palpable goose eggs or abrasions noted.     Nose: Nose normal.     Mouth/Throat:     Mouth: Mucous membranes are dry.  Cardiovascular:     Rate and Rhythm: Normal rate.  Pulmonary:     Effort: Pulmonary effort is normal.  Abdominal:     General: Abdomen is flat.     Palpations: Abdomen is soft.     Tenderness: There is no abdominal tenderness.  Musculoskeletal:     Cervical back: Normal range  of motion and neck supple.  Skin:    General: Skin is warm and dry.  Neurological:     Comments: Difficult to arouse on exam but suspect drugs on board.  Moves upper and lower extremities without obvious focal deficit noted.      ED Results / Procedures / Treatments   Labs (all labs ordered are listed, but only abnormal results are displayed) Labs Reviewed  CBC WITH DIFFERENTIAL/PLATELET - Abnormal; Notable for the following components:      Result Value   HCT 46.3 (*)    All other components within normal limits  COMPREHENSIVE METABOLIC PANEL - Abnormal; Notable for the following components:   Potassium 3.4 (*)    All other components within normal limits  SALICYLATE LEVEL - Abnormal; Notable for the following components:   Salicylate Lvl <7.0 (*)    All other components within normal limits  ETHANOL  HCG, SERUM, QUALITATIVE  RAPID URINE DRUG SCREEN, HOSP PERFORMED  HIV ANTIBODY (ROUTINE TESTING W REFLEX)  HEMOGLOBIN A1C    EKG None  Radiology MR BRAIN W WO CONTRAST  Result Date: 05/19/2023 CLINICAL DATA:  Mental status change, cause. EXAM: MRI HEAD WITHOUT AND WITH CONTRAST TECHNIQUE:  Multiplanar, multiecho pulse sequences of the brain and surrounding structures were obtained without and with intravenous contrast. CONTRAST:  6mL GADAVIST  GADOBUTROL  1 MMOL/ML IV SOLN COMPARISON:  Head CT May 19, 2023. FINDINGS: Brain: Small focus of restricted in the posterior aspect of the left putamen without definite contrast. No hemorrhage, hydrocephalus, extra-axial collection mass lesion multiple small foci encephalomalacia with surrounding gliosis in the centrum semiovale and corona radiata as well as bilateral basal ganglia and right thalamus. Small amount of scattered foci of T2 hyperintensity are seen within the white matter of the cerebral hemispheres. T2 hyperintense lesion of seen on axial T2 images (series 8, image 7) does not have a correlate bone the other images and is likely  artifactual. No focus of abnormal contrast enhancement identified. Vascular: Normal flow voids. Skull and upper cervical spine: Normal marrow signal. Sinuses/Orbits: Trace mucosal thickening throughout the paranasal sinuses. The orbits are. Other: None. IMPRESSION: 1. 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 2. Multiple small foci of encephalomalacia with surrounding gliosis in the centrum semiovale and corona radiata as well as bilateral basal ganglia and right thalamus. While demyelinating disease is an important consideration in this age group, image appearance is suggestive of chronic infarcts, particularly in the setting of a small acute left putaminal infarct. 3. Small amount of scattered nonspecific foci of T2 hyperintensity are seen within the white matter of the cerebral hemispheres may be related to demyelinating, vasculitis and sequela of prior inflammatory/infectious process. Electronically Signed   By: Katyucia  de Macedo Rodrigues M.D.   On: 05/19/2023 13:41   CT HEAD WO CONTRAST ( )  Result Date: 05/19/2023 CLINICAL DATA:  Head trauma, minor, normal mental status (Age 6-64y) EXAM: CT HEAD WITHOUT CONTRAST TECHNIQUE: Contiguous axial images were obtained from the base of the skull through the vertex without intravenous contrast. RADIATION DOSE REDUCTION: This exam was performed according to the departmental dose-optimization program which includes automated exposure control, adjustment of the mA and/or kV according to patient size and/or use of iterative reconstruction technique. COMPARISON:  None Available. FINDINGS: Brain: There are scattered nonspecific periventricular subcortical hypodense lesions, for example in the centrum semiovale on the left (series 2, image 22). Given patient's demographics, these could represent demyelinating lesions. No hemorrhage. No hydrocephalus. No extra-axial fluid collection. Vascular: No hyperdense vessel or unexpected calcification.  Skull: Normal. Negative for fracture or focal lesion. Sinuses/Orbits: No middle ear or mastoid effusion. Paranasal sinuses are clear. Orbits are unremarkable. Other: None. IMPRESSION: 1. No CT evidence of intracranial injury 2. Scattered nonspecific periventricular subcortical hypodense lesions, for example in the centrum semiovale on the left. Given patient's demographics, these could represent demyelinating lesions. Recommend further evaluation with a brain MRI with and without contrast. Electronically Signed   By: Lyndall Gore M.D.   On: 05/19/2023 08:11    Procedures Procedures    Medications Ordered in ED Medications  LORazepam  (ATIVAN ) injection 1 mg (has no administration in time range)  naloxone  (NARCAN ) injection 0.2 mg (0.2 mg Intravenous Given 05/19/23 1055)  aspirin  tablet 325 mg (has no administration in time range)  enoxaparin  (LOVENOX ) injection 40 mg (has no administration in time range)  acetaminophen  (TYLENOL ) tablet 650 mg (has no administration in time range)    Or  acetaminophen  (TYLENOL ) suppository 650 mg (has no administration in time range)  ondansetron  (ZOFRAN ) tablet 4 mg (has no administration in time range)    Or  ondansetron  (ZOFRAN ) injection 4 mg (has no administration in time range)  stroke: early stages of recovery book (has no administration in time range)  naloxone  (NARCAN ) injection 0.2 mg (0.2 mg Intravenous Given 05/19/23 1048)  ondansetron  (ZOFRAN ) injection 4 mg (4 mg Intravenous Given 05/19/23 1046)  gadobutrol  (GADAVIST ) 1 MMOL/ML injection 6 mL (6 mLs Intravenous Contrast Given 05/19/23 1234)    ED Course/ Medical Decision Making/ A&P                                 Medical Decision Making Amount and/or Complexity of Data Reviewed Labs: ordered. Radiology: ordered.  Risk OTC drugs. Prescription drug management. Decision regarding hospitalization.    This patient presents to the ED for concern of fall, head injury, this involves a  number of treatment options, and is a complaint that carries with it a high risk of complications and morbidity.  The differential diagnosis includes syncope versus fall versus metabolic derangement versus substance abuse.     Co morbidities: Discussed in HPI   Brief History:  See HPI.   EMR reviewed including pt PMHx, past surgical history and past visits to ER.   See HPI for more details   Lab Tests:  I ordered and independently interpreted labs.  The pertinent results include:    I personally reviewed all laboratory work and imaging. Metabolic panel without any acute abnormality specifically kidney function within normal limits and no significant electrolyte abnormalities. CBC without leukocytosis or significant anemia.   Imaging Studies:  CT Head showed: IMPRESSION:  1. No CT evidence of intracranial injury  2. Scattered nonspecific periventricular subcortical hypodense  lesions, for example in the centrum semiovale on the left. Given  patient's demographics, these could represent demyelinating lesions.  Recommend further evaluation with a brain MRI with and without  contrast.    MRI Brain has been ordered: IMPRESSION:  1. 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  2. Multiple small foci of encephalomalacia with surrounding gliosis  in the centrum semiovale and corona radiata as well as bilateral  basal ganglia and right thalamus. While demyelinating disease is an  important consideration in this age group, image appearance is  suggestive of chronic infarcts, particularly in the setting of a  small acute left putaminal infarct.  3. Small amount of scattered nonspecific foci of T2 hyperintensity  are seen within the white matter of the cerebral hemispheres may be  related to demyelinating, vasculitis and sequela of prior  inflammatory/infectious process.   Cardiac Monitoring:  NSR while in room, no witnessed arrhythmia  Medicines  ordered:  I ordered medication including zofran , narcan   for symptomatic treatment  Reevaluation of the patient after these medicines showed that the patient improved I have reviewed the patients home medicines and have made adjustments as needed Medication was needed for patient as she was uncooperative with MRI staff and when awake she is asking for food multiple times and refusing to answer questions.   Consults:  I requested consultation with neurology Dr. Matthews,  and discussed lab and imaging findings as well as pertinent plan - they recommend: Admission via hospice service, will need to have patient transfer over to [place].  She will need a swallow screen, full-strength aspirin  325, and blood pressure goals with a systolic less than 180.  Reevaluation:  After the interventions noted above I re-evaluated patient and found that they have :stayed the same   Social Determinants of Health:  The patient's social determinants  of health were a factor in the care of this patient   Unsure on patients home situation.    Problem List / ED Course:  Patient presents to the ED after a bystander called EMS as they saw her fall and lay on the ground.  There is some concern for drug abuse at this time.  Patient arrived to the ED with pinpoint pupils, did admit to cocaine use to nursing staff.  She was uncooperative, did not want to answer any questions but was requesting food early on.  Moving her upper and lower extremities.  Hemodynamically stable.  CT obtained had an abnormality concerning for demyelination, or for they wanted to proceed with an MRI.  Unfortunately, patient was to uncooperative and unresponsive for MRI staff, therefore Narcan  had to be given, she received a total of 0.4 mg along with some Zofran , now patient is I need food I have been here since 7 AM .  She also endorsing claustrophobia for the MRI therefore Ativan  was ordered, however this was held off as patient was too  sedated during my evaluation.  She also urinated on herself twice. CBC with no leukocytosis, no source of infection, she is afebrile here.  Hemoglobin is stable.  CMP with no electrolyte derangement, creatinine levels unremarkable.  LFTs are within normal limits.  Her ethanol level is negative.  Salicylate was negative, and pregnancy test is also negative.  MRI was concerning for an acute infarct, therefore call was placed for neurology in order to obtain further recommendations. Discussed the case with Dr. Matthews of neurology who recommended patient would meet criteria for transfer to [facility], she will need to be paged once patient arrives to [facility].  Call placed for hospitalist service for further admission. Spoke to hospitalist Dr. Robie, [facility] hospitalist service who will admit patient for further management at this time.  Dispostion:  After consideration of the diagnostic results and the patients response to treatment, I feel that the patent would benefit from admission for further stroke workup.    Portions of this note were generated with Scientist, clinical (histocompatibility and immunogenetics). Dictation errors may occur despite best attempts at proofreading.   Final Clinical Impression(s) / ED Diagnoses Final diagnoses:  Fall, initial encounter  Injury of head, initial encounter    Rx / DC Orders ED Discharge Orders     None         Soto, Johana, PA-C 05/19/23 1422    Patsey Lot, MD 05/19/23 1542

## 2023-05-19 NOTE — H&P (Addendum)
 History and Physical  Alexandra Buckley WUJ:[id number] DOB: 1970/12/12 DOA: 05/19/2023  PCP: Pcp, No   Chief Complaint: Found down  HPI: Alexandra Buckley is a 52 y.o. female who denies any past medical history being admitted to the hospital with acute stroke.  She admits to snorting cocaine yesterday, lasting remembers walking down the street and falling.  Seems like this was not witnessed, bystander called 911 because patient was found sleeping on the ground next to the road.  After arrival in the emergency department, patient was given 2 rounds of Narcan due to somnolence, though she was protecting her airway.  Now she is more awake, denies to me any recent illness.  Specifically denies any fevers, chills, nausea, vomiting.  Says that she has a little bit of a headache but otherwise no complaints.  Wants to know when she can eat.  ED Course: Further evaluation in the emergency department, she is hypertensive but hemodynamically stable, saturating 100% on room air.  Lab work including CBC, CMP are relatively unremarkable, as is Tylenol and salicylate level.  Urine drug screen is pending.  CT scan of the head showed some abnormality, so MRI of the brain was done which shows evidence of acute CVA is loaded below.  Provider discussed with Dr. Selina Cooley of neurology, who recommends admission to [facility] for formal neurology evaluation.  Review of Systems: Please see HPI for pertinent positives and negatives. A complete 10 system review of systems are otherwise negative.  No past medical history on file.  Social History:  has no history on file for tobacco use, alcohol use, and drug use.  No Known Allergies  No family history on file.   Prior to Admission medications   Not on File    Physical Exam: BP (!) 176/88 (BP Location: Left Arm)   Pulse (!) 51   Temp 97.7 F (36.5 C) (Oral)   Resp 11   Ht 5\' 2"  (1.575 m)   Wt 63.5 kg   SpO2 100%   BMI 25.61 kg/m   General:  Alert, oriented, calm, in  no acute distress, resting in bed comfortably, easily arousable and conversant Eyes: EOMI, clear conjuctivae, white sclerea Neck: supple, no masses, trachea mildline  Cardiovascular: RRR, no murmurs or rubs, no peripheral edema  Respiratory: clear to auscultation bilaterally, no wheezes, no crackles  Abdomen: soft, nontender, nondistended, normal bowel tones heard  Skin: dry, no rashes  Musculoskeletal: no joint effusions, normal range of motion  Psychiatric: appropriate affect, normal speech  Neurologic: extraocular muscles intact, clear speech, moving all extremities with intact sensorium          Labs on Admission:  Basic Metabolic Panel: Recent Labs  Lab 05/19/23 0858  NA 139  K 3.4*  CL 104  CO2 26  GLUCOSE 87  BUN 11  CREATININE 0.73  CALCIUM 9.2   Liver Function Tests: Recent Labs  Lab 05/19/23 0858  AST 16  ALT 18  ALKPHOS 111  BILITOT 0.5  PROT 8.0  ALBUMIN 4.3   No results for input(s): "LIPASE", "AMYLASE" in the last 168 hours. No results for input(s): "AMMONIA" in the last 168 hours. CBC: Recent Labs  Lab 05/19/23 0858  WBC 6.1  NEUTROABS 3.2  HGB 14.3  HCT 46.3*  MCV 91.5  PLT 272   Cardiac Enzymes: No results for input(s): "CKTOTAL", "CKMB", "CKMBINDEX", "TROPONINI" in the last 168 hours.  BNP (last 3 results) No results for input(s): "BNP" in the last 8760 hours.  ProBNP (last  3 results) No results for input(s): "PROBNP" in the last 8760 hours.  CBG: No results for input(s): "GLUCAP" in the last 168 hours.  Radiological Exams on Admission: MR BRAIN W WO CONTRAST  Result Date: 05/19/2023 CLINICAL DATA:  Mental status change, cause. EXAM: MRI HEAD WITHOUT AND WITH CONTRAST TECHNIQUE: Multiplanar, multiecho pulse sequences of the brain and surrounding structures were obtained without and with intravenous contrast. CONTRAST:  6mL GADAVIST GADOBUTROL 1 MMOL/ML IV SOLN COMPARISON:  Head CT May 19, 2023. FINDINGS: Brain: Small focus of  restricted in the posterior aspect of the left putamen without definite contrast. No hemorrhage, hydrocephalus, extra-axial collection mass lesion multiple small foci encephalomalacia with surrounding gliosis in the centrum semiovale and corona radiata as well as bilateral basal ganglia and right thalamus. Small amount of scattered foci of T2 hyperintensity are seen within the white matter of the cerebral hemispheres. T2 hyperintense lesion of seen on axial T2 images (series 8, image 7) does not have a correlate bone the other images and is likely artifactual. No focus of abnormal contrast enhancement identified. Vascular: Normal flow voids. Skull and upper cervical spine: Normal marrow signal. Sinuses/Orbits: Trace mucosal thickening throughout the paranasal sinuses. The orbits are. Other: None. IMPRESSION: 1. 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 2. Multiple small foci of encephalomalacia with surrounding gliosis in the centrum semiovale and corona radiata as well as bilateral basal ganglia and right thalamus. While demyelinating disease is an important consideration in this age group, image appearance is suggestive of chronic infarcts, particularly in the setting of a small acute left putaminal infarct. 3. Small amount of scattered nonspecific foci of T2 hyperintensity are seen within the white matter of the cerebral hemispheres may be related to demyelinating, vasculitis and sequela of prior inflammatory/infectious process. Electronically Signed   By: Baldemar Lenis M.D.   On: 05/19/2023 13:41   CT HEAD WO CONTRAST ( )  Result Date: 05/19/2023 CLINICAL DATA:  Head trauma, minor, normal mental status (Age 31-64y) EXAM: CT HEAD WITHOUT CONTRAST TECHNIQUE: Contiguous axial images were obtained from the base of the skull through the vertex without intravenous contrast. RADIATION DOSE REDUCTION: This exam was performed according to the departmental dose-optimization  program which includes automated exposure control, adjustment of the mA and/or kV according to patient size and/or use of iterative reconstruction technique. COMPARISON:  None Available. FINDINGS: Brain: There are scattered nonspecific periventricular subcortical hypodense lesions, for example in the centrum semiovale on the left (series 2, image 22). Given patient's demographics, these could represent demyelinating lesions. No hemorrhage. No hydrocephalus. No extra-axial fluid collection. Vascular: No hyperdense vessel or unexpected calcification. Skull: Normal. Negative for fracture or focal lesion. Sinuses/Orbits: No middle ear or mastoid effusion. Paranasal sinuses are clear. Orbits are unremarkable. Other: None. IMPRESSION: 1. No CT evidence of intracranial injury 2. Scattered nonspecific periventricular subcortical hypodense lesions, for example in the centrum semiovale on the left. Given patient's demographics, these could represent demyelinating lesions. Recommend further evaluation with a brain MRI with and without contrast. Electronically Signed   By: Lorenza Cambridge M.D.   On: 05/19/2023 08:11    Assessment/Plan 52 year old female with reported history of substance abuse admitted to the hospital with acute left putamen infarction in the setting of cocaine abuse.  Acute left putamen ischemic stroke-likely due to recent cocaine use.  She was given aspirin in the emergency department. -Inpatient admission -Telemetry monitor -Keep n.p.o. until nurse bedside swallow screen -PT/OT consult -Permissive hypertension up to  systolic greater than 180 -Check fasting lipids and hemoglobin A1c -2D echo -CTA head and neck -ER provider discussed with Dr. Selina Cooley of neurology, who wishes to be notified when patient arrives at [facility]  Drug abuse-TOC consult  DVT prophylaxis: Lovenox     Code Status: Full Code  Consults called: Neurology Dr. Selina Cooley  Admission status: The appropriate patient status for  this patient is INPATIENT. Inpatient status is judged to be reasonable and necessary in order to provide the required intensity of service to ensure the patient's safety. The patient's presenting symptoms, physical exam findings, and initial radiographic and laboratory data in the context of their chronic comorbidities is felt to place them at high risk for further clinical deterioration. Furthermore, it is not anticipated that the patient will be medically stable for discharge from the hospital within 2 midnights of admission.    I certify that at the point of admission it is my clinical judgment that the patient will require inpatient hospital care spanning beyond 2 midnights from the point of admission due to high intensity of service, high risk for further deterioration and high frequency of surveillance required  Time spent: 48 minutes  Hajira Verhagen Sharlette Dense MD [facility] Pager [phone]  If 7PM-7AM, please contact night-coverage [email or web address] Password [email or web address]  05/19/2023, 2:20 PM

## 2023-05-19 NOTE — H&P (Incomplete Revision)
 History and Physical  Alexandra Buckley WUJ:[id number] DOB: 1970/12/12 DOA: 05/19/2023  PCP: Pcp, No   Chief Complaint: Found down  HPI: Alexandra Buckley is a 52 y.o. female who denies any past medical history being admitted to the hospital with acute stroke.  She admits to snorting cocaine yesterday, lasting remembers walking down the street and falling.  Seems like this was not witnessed, bystander called 911 because patient was found sleeping on the ground next to the road.  After arrival in the emergency department, patient was given 2 rounds of Narcan due to somnolence, though she was protecting her airway.  Now she is more awake, denies to me any recent illness.  Specifically denies any fevers, chills, nausea, vomiting.  Says that she has a little bit of a headache but otherwise no complaints.  Wants to know when she can eat.  ED Course: Further evaluation in the emergency department, she is hypertensive but hemodynamically stable, saturating 100% on room air.  Lab work including CBC, CMP are relatively unremarkable, as is Tylenol and salicylate level.  Urine drug screen is pending.  CT scan of the head showed some abnormality, so MRI of the brain was done which shows evidence of acute CVA is loaded below.  Provider discussed with Dr. Selina Cooley of neurology, who recommends admission to [facility] for formal neurology evaluation.  Review of Systems: Please see HPI for pertinent positives and negatives. A complete 10 system review of systems are otherwise negative.  No past medical history on file.  Social History:  has no history on file for tobacco use, alcohol use, and drug use.  No Known Allergies  No family history on file.   Prior to Admission medications   Not on File    Physical Exam: BP (!) 176/88 (BP Location: Left Arm)   Pulse (!) 51   Temp 97.7 F (36.5 C) (Oral)   Resp 11   Ht 5\' 2"  (1.575 m)   Wt 63.5 kg   SpO2 100%   BMI 25.61 kg/m   General:  Alert, oriented, calm, in  no acute distress, resting in bed comfortably, easily arousable and conversant Eyes: EOMI, clear conjuctivae, white sclerea Neck: supple, no masses, trachea mildline  Cardiovascular: RRR, no murmurs or rubs, no peripheral edema  Respiratory: clear to auscultation bilaterally, no wheezes, no crackles  Abdomen: soft, nontender, nondistended, normal bowel tones heard  Skin: dry, no rashes  Musculoskeletal: no joint effusions, normal range of motion  Psychiatric: appropriate affect, normal speech  Neurologic: extraocular muscles intact, clear speech, moving all extremities with intact sensorium          Labs on Admission:  Basic Metabolic Panel: Recent Labs  Lab 05/19/23 0858  NA 139  K 3.4*  CL 104  CO2 26  GLUCOSE 87  BUN 11  CREATININE 0.73  CALCIUM 9.2   Liver Function Tests: Recent Labs  Lab 05/19/23 0858  AST 16  ALT 18  ALKPHOS 111  BILITOT 0.5  PROT 8.0  ALBUMIN 4.3   No results for input(s): "LIPASE", "AMYLASE" in the last 168 hours. No results for input(s): "AMMONIA" in the last 168 hours. CBC: Recent Labs  Lab 05/19/23 0858  WBC 6.1  NEUTROABS 3.2  HGB 14.3  HCT 46.3*  MCV 91.5  PLT 272   Cardiac Enzymes: No results for input(s): "CKTOTAL", "CKMB", "CKMBINDEX", "TROPONINI" in the last 168 hours.  BNP (last 3 results) No results for input(s): "BNP" in the last 8760 hours.  ProBNP (last  3 results) No results for input(s): "PROBNP" in the last 8760 hours.  CBG: No results for input(s): "GLUCAP" in the last 168 hours.  Radiological Exams on Admission: MR BRAIN W WO CONTRAST  Result Date: 05/19/2023 CLINICAL DATA:  Mental status change, cause. EXAM: MRI HEAD WITHOUT AND WITH CONTRAST TECHNIQUE: Multiplanar, multiecho pulse sequences of the brain and surrounding structures were obtained without and with intravenous contrast. CONTRAST:  6mL GADAVIST GADOBUTROL 1 MMOL/ML IV SOLN COMPARISON:  Head CT May 19, 2023. FINDINGS: Brain: Small focus of  restricted in the posterior aspect of the left putamen without definite contrast. No hemorrhage, hydrocephalus, extra-axial collection mass lesion multiple small foci encephalomalacia with surrounding gliosis in the centrum semiovale and corona radiata as well as bilateral basal ganglia and right thalamus. Small amount of scattered foci of T2 hyperintensity are seen within the white matter of the cerebral hemispheres. T2 hyperintense lesion of seen on axial T2 images (series 8, image 7) does not have a correlate bone the other images and is likely artifactual. No focus of abnormal contrast enhancement identified. Vascular: Normal flow voids. Skull and upper cervical spine: Normal marrow signal. Sinuses/Orbits: Trace mucosal thickening throughout the paranasal sinuses. The orbits are. Other: None. IMPRESSION: 1. 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 2. Multiple small foci of encephalomalacia with surrounding gliosis in the centrum semiovale and corona radiata as well as bilateral basal ganglia and right thalamus. While demyelinating disease is an important consideration in this age group, image appearance is suggestive of chronic infarcts, particularly in the setting of a small acute left putaminal infarct. 3. Small amount of scattered nonspecific foci of T2 hyperintensity are seen within the white matter of the cerebral hemispheres may be related to demyelinating, vasculitis and sequela of prior inflammatory/infectious process. Electronically Signed   By: Baldemar Lenis M.D.   On: 05/19/2023 13:41   CT HEAD WO CONTRAST ( )  Result Date: 05/19/2023 CLINICAL DATA:  Head trauma, minor, normal mental status (Age 31-64y) EXAM: CT HEAD WITHOUT CONTRAST TECHNIQUE: Contiguous axial images were obtained from the base of the skull through the vertex without intravenous contrast. RADIATION DOSE REDUCTION: This exam was performed according to the departmental dose-optimization  program which includes automated exposure control, adjustment of the mA and/or kV according to patient size and/or use of iterative reconstruction technique. COMPARISON:  None Available. FINDINGS: Brain: There are scattered nonspecific periventricular subcortical hypodense lesions, for example in the centrum semiovale on the left (series 2, image 22). Given patient's demographics, these could represent demyelinating lesions. No hemorrhage. No hydrocephalus. No extra-axial fluid collection. Vascular: No hyperdense vessel or unexpected calcification. Skull: Normal. Negative for fracture or focal lesion. Sinuses/Orbits: No middle ear or mastoid effusion. Paranasal sinuses are clear. Orbits are unremarkable. Other: None. IMPRESSION: 1. No CT evidence of intracranial injury 2. Scattered nonspecific periventricular subcortical hypodense lesions, for example in the centrum semiovale on the left. Given patient's demographics, these could represent demyelinating lesions. Recommend further evaluation with a brain MRI with and without contrast. Electronically Signed   By: Lorenza Cambridge M.D.   On: 05/19/2023 08:11    Assessment/Plan 52 year old female with reported history of substance abuse admitted to the hospital with acute left putamen infarction in the setting of cocaine abuse.  Acute left putamen ischemic stroke-likely due to recent cocaine use.  She was given aspirin in the emergency department. -Inpatient admission -Telemetry monitor -Keep n.p.o. until nurse bedside swallow screen -PT/OT consult -Permissive hypertension up to  systolic greater than 180 -Check fasting lipids and hemoglobin A1c -2D echo -CTA head and neck -ER provider discussed with Dr. Selina Cooley of neurology, who wishes to be notified when patient arrives at [facility]  Drug abuse-TOC consult  DVT prophylaxis: Lovenox     Code Status: Full Code  Consults called: Neurology Dr. Selina Cooley  Admission status: The appropriate patient status for  this patient is INPATIENT. Inpatient status is judged to be reasonable and necessary in order to provide the required intensity of service to ensure the patient's safety. The patient's presenting symptoms, physical exam findings, and initial radiographic and laboratory data in the context of their chronic comorbidities is felt to place them at high risk for further clinical deterioration. Furthermore, it is not anticipated that the patient will be medically stable for discharge from the hospital within 2 midnights of admission.    I certify that at the point of admission it is my clinical judgment that the patient will require inpatient hospital care spanning beyond 2 midnights from the point of admission due to high intensity of service, high risk for further deterioration and high frequency of surveillance required  Time spent: 48 minutes  Alexandra Verhagen Sharlette Dense MD [facility] Pager [phone]  If 7PM-7AM, please contact night-coverage [email or web address] Password [email or web address]  05/19/2023, 2:20 PM

## 2023-05-19 NOTE — ED Provider Notes (Incomplete Revision)
[place] EMERGENCY DEPARTMENT AT [facility] Provider Note   CSN: [id number] Arrival date & time: 05/19/23  5784     History  Chief Complaint  Patient presents with   Marletta Lor    Alexandra Buckley is a 52 y.o. female.  52 year old female with no PMH presents to the ED brought in via EMS with a chief complaint of fall.  According to EMS report, patient was found on the ground, had a fall according to a bystander.  Patient did report hitting the back of her head but denies loss of consciousness.  There is some concern for history of drug use prior to this incident. Patient uncooperative with interview,level 5 caveat due to mental status.   The history is provided by the patient.  Fall Pertinent negatives include no chest pain, no abdominal pain and no shortness of breath.       Home Medications Prior to Admission medications   Not on File      Allergies    Patient has no known allergies.    Review of Systems   Review of Systems  Constitutional:  Negative for fever.  Respiratory:  Negative for shortness of breath.   Cardiovascular:  Negative for chest pain.  Gastrointestinal:  Negative for abdominal pain.  All other systems reviewed and are negative.   Physical Exam Updated Vital Signs BP (!) 176/88 (BP Location: Left Arm)   Pulse (!) 51   Temp 97.7 F (36.5 C) (Oral)   Resp 11   Ht 5\' 2"  (1.575 m)   Wt 63.5 kg   SpO2 100%   BMI 25.61 kg/m  Physical Exam Vitals and nursing note reviewed.  Constitutional:      Comments: Sleeping during evaluation.   HENT:     Head: Normocephalic.     Comments: No palpable goose eggs or abrasions noted.     Nose: Nose normal.     Mouth/Throat:     Mouth: Mucous membranes are dry.  Cardiovascular:     Rate and Rhythm: Normal rate.  Pulmonary:     Effort: Pulmonary effort is normal.  Abdominal:     General: Abdomen is flat.     Palpations: Abdomen is soft.     Tenderness: There is no abdominal tenderness.   Musculoskeletal:     Cervical back: Normal range of motion and neck supple.  Skin:    General: Skin is warm and dry.  Neurological:     Comments: Difficult to arouse on exam but suspect drugs on board.  Moves upper and lower extremities without obvious focal deficit noted.      ED Results / Procedures / Treatments   Labs (all labs ordered are listed, but only abnormal results are displayed) Labs Reviewed  CBC WITH DIFFERENTIAL/PLATELET - Abnormal; Notable for the following components:      Result Value   HCT 46.3 (*)    All other components within normal limits  COMPREHENSIVE METABOLIC PANEL - Abnormal; Notable for the following components:   Potassium 3.4 (*)    All other components within normal limits  SALICYLATE LEVEL - Abnormal; Notable for the following components:   Salicylate Lvl <7.0 (*)    All other components within normal limits  ETHANOL  HCG, SERUM, QUALITATIVE  RAPID URINE DRUG SCREEN, HOSP PERFORMED  HIV ANTIBODY (ROUTINE TESTING W REFLEX)  HEMOGLOBIN A1C    EKG None  Radiology MR BRAIN W WO CONTRAST  Result Date: 05/19/2023 CLINICAL DATA:  Mental status change, cause.  EXAM: MRI HEAD WITHOUT AND WITH CONTRAST TECHNIQUE: Multiplanar, multiecho pulse sequences of the brain and surrounding structures were obtained without and with intravenous contrast. CONTRAST:  6mL GADAVIST GADOBUTROL 1 MMOL/ML IV SOLN COMPARISON:  Head CT May 19, 2023. FINDINGS: Brain: Small focus of restricted in the posterior aspect of the left putamen without definite contrast. No hemorrhage, hydrocephalus, extra-axial collection mass lesion multiple small foci encephalomalacia with surrounding gliosis in the centrum semiovale and corona radiata as well as bilateral basal ganglia and right thalamus. Small amount of scattered foci of T2 hyperintensity are seen within the white matter of the cerebral hemispheres. T2 hyperintense lesion of seen on axial T2 images (series 8, image 7) does not  have a correlate bone the other images and is likely artifactual. No focus of abnormal contrast enhancement identified. Vascular: Normal flow voids. Skull and upper cervical spine: Normal marrow signal. Sinuses/Orbits: Trace mucosal thickening throughout the paranasal sinuses. The orbits are. Other: None. IMPRESSION: 1. 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 2. Multiple small foci of encephalomalacia with surrounding gliosis in the centrum semiovale and corona radiata as well as bilateral basal ganglia and right thalamus. While demyelinating disease is an important consideration in this age group, image appearance is suggestive of chronic infarcts, particularly in the setting of a small acute left putaminal infarct. 3. Small amount of scattered nonspecific foci of T2 hyperintensity are seen within the white matter of the cerebral hemispheres may be related to demyelinating, vasculitis and sequela of prior inflammatory/infectious process. Electronically Signed   By: Baldemar Lenis M.D.   On: 05/19/2023 13:41   CT HEAD WO CONTRAST ( )  Result Date: 05/19/2023 CLINICAL DATA:  Head trauma, minor, normal mental status (Age 34-64y) EXAM: CT HEAD WITHOUT CONTRAST TECHNIQUE: Contiguous axial images were obtained from the base of the skull through the vertex without intravenous contrast. RADIATION DOSE REDUCTION: This exam was performed according to the departmental dose-optimization program which includes automated exposure control, adjustment of the mA and/or kV according to patient size and/or use of iterative reconstruction technique. COMPARISON:  None Available. FINDINGS: Brain: There are scattered nonspecific periventricular subcortical hypodense lesions, for example in the centrum semiovale on the left (series 2, image 22). Given patient's demographics, these could represent demyelinating lesions. No hemorrhage. No hydrocephalus. No extra-axial fluid collection. Vascular:  No hyperdense vessel or unexpected calcification. Skull: Normal. Negative for fracture or focal lesion. Sinuses/Orbits: No middle ear or mastoid effusion. Paranasal sinuses are clear. Orbits are unremarkable. Other: None. IMPRESSION: 1. No CT evidence of intracranial injury 2. Scattered nonspecific periventricular subcortical hypodense lesions, for example in the centrum semiovale on the left. Given patient's demographics, these could represent demyelinating lesions. Recommend further evaluation with a brain MRI with and without contrast. Electronically Signed   By: Lorenza Cambridge M.D.   On: 05/19/2023 08:11    Procedures Procedures    Medications Ordered in ED Medications  LORazepam (ATIVAN) injection 1 mg (has no administration in time range)  naloxone [facility]) injection 0.2 mg (0.2 mg Intravenous Given 05/19/23 1055)  aspirin tablet 325 mg (has no administration in time range)  enoxaparin (LOVENOX) injection 40 mg (has no administration in time range)  acetaminophen (TYLENOL) tablet 650 mg (has no administration in time range)    Or  acetaminophen (TYLENOL) suppository 650 mg (has no administration in time range)  ondansetron (ZOFRAN) tablet 4 mg (has no administration in time range)    Or  ondansetron (ZOFRAN) injection 4  mg (has no administration in time range)   stroke: early stages of recovery book (has no administration in time range)  naloxone [facility]) injection 0.2 mg (0.2 mg Intravenous Given 05/19/23 1048)  ondansetron (ZOFRAN) injection 4 mg (4 mg Intravenous Given 05/19/23 1046)  gadobutrol (GADAVIST) 1 MMOL/ML injection 6 mL (6 mLs Intravenous Contrast Given 05/19/23 1234)    ED Course/ Medical Decision Making/ A&P                                 Medical Decision Making Amount and/or Complexity of Data Reviewed Labs: ordered. Radiology: ordered.  Risk OTC drugs. Prescription drug management. Decision regarding hospitalization.    This patient presents to the ED for  concern of fall, head injury, this involves a number of treatment options, and is a complaint that carries with it a high risk of complications and morbidity.  The differential diagnosis includes syncope versus fall versus metabolic derangement versus substance abuse.     Co morbidities: Discussed in HPI   Brief History:  See HPI.   EMR reviewed including pt PMHx, past surgical history and past visits to ER.   See HPI for more details   Lab Tests:  I ordered and independently interpreted labs.  The pertinent results include:    I personally reviewed all laboratory work and imaging. Metabolic panel without any acute abnormality specifically kidney function within normal limits and no significant electrolyte abnormalities. CBC without leukocytosis or significant anemia.   Imaging Studies:  CT Head showed: IMPRESSION:  1. No CT evidence of intracranial injury  2. Scattered nonspecific periventricular subcortical hypodense  lesions, for example in the centrum semiovale on the left. Given  patient's demographics, these could represent demyelinating lesions.  Recommend further evaluation with a brain MRI with and without  contrast.    MRI Brain has been ordered: IMPRESSION:  1. 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  2. Multiple small foci of encephalomalacia with surrounding gliosis  in the centrum semiovale and corona radiata as well as bilateral  basal ganglia and right thalamus. While demyelinating disease is an  important consideration in this age group, image appearance is  suggestive of chronic infarcts, particularly in the setting of a  small acute left putaminal infarct.  3. Small amount of scattered nonspecific foci of T2 hyperintensity  are seen within the white matter of the cerebral hemispheres may be  related to demyelinating, vasculitis and sequela of prior  inflammatory/infectious process.   Cardiac Monitoring:  NSR while in room,  no witnessed arrhythmia  Medicines ordered:  I ordered medication including zofran, narcan  for symptomatic treatment  Reevaluation of the patient after these medicines showed that the patient improved I have reviewed the patients home medicines and have made adjustments as needed Medication was needed for patient as she was uncooperative with MRI staff and when awake she is asking for food multiple times and refusing to answer questions.   Consults:  I requested consultation with neurology Dr. Selina Cooley,  and discussed lab and imaging findings as well as pertinent plan - they recommend: Admission via hospice service, will need to have patient transfer over to [facility].  She will need a swallow screen, full-strength aspirin 325, and blood pressure goals with a systolic less than 180.  Reevaluation:  After the interventions noted above I re-evaluated patient and found that they have :stayed the same  Social Determinants of Health:  The patient's social determinants of health were a factor in the care of this patient   Unsure on patients home situation.    Problem List / ED Course:  Patient presents to the ED after a bystander called EMS as they saw her fall and lay on the ground.  There is some concern for drug abuse at this time.  Patient arrived to the ED with pinpoint pupils, did admit to cocaine use to nursing staff.  She was uncooperative, did not want to answer any questions but was requesting food early on.  Moving her upper and lower extremities.  Hemodynamically stable.  CT obtained had an abnormality concerning for demyelination, or for they wanted to proceed with an MRI.  Unfortunately, patient was to uncooperative and unresponsive for MRI staff, therefore Narcan had to be given, she received a total of 0.4 mg along with some Zofran, now patient is "I need food I have been here since 7 AM ".  She also endorsing claustrophobia for the MRI therefore Ativan was ordered, however this  was held off as patient was too sedated during my evaluation.  She also urinated on herself twice. CBC with no leukocytosis, no source of infection, she is afebrile here.  Hemoglobin is stable.  CMP with no electrolyte derangement, creatinine levels unremarkable.  LFTs are within normal limits.  Her ethanol level is negative.  Salicylate was negative, and pregnancy test is also negative.  MRI was concerning for an acute infarct, therefore call was placed for neurology in order to obtain further recommendations. Discussed the case with Dr. Selina Cooley of neurology who recommended patient would meet criteria for transfer to [facility], she will need to be paged once patient arrives to [facility].  Call placed for hospitalist service for further admission. Spoke to hospitalist Dr. Darvin Neighbours, [facility] who will admit patient for further management at this time.  Dispostion:  After consideration of the diagnostic results and the patients response to treatment, I feel that the patent would benefit from admission for further stroke workup.    Portions of this note were generated with Scientist, clinical (histocompatibility and immunogenetics). Dictation errors may occur despite best attempts at proofreading.   Final Clinical Impression(s) / ED Diagnoses Final diagnoses:  Fall, initial encounter  Injury of head, initial encounter    Rx / DC Orders ED Discharge Orders     None         Claude Manges, PA-C 05/19/23 1422    Benjiman Core, MD 05/19/23 1542

## 2023-05-19 NOTE — Consult Note (Addendum)
 Neurology Consultation  Reason for Consult: Stroke on MRI Referring Physician: Kirby Crigler  CC: Stroke on MRI   History is obtained from: patient  HPI: Alexandra Buckley is a 52 y.o. female with a past medical history of drug use presenting after being found unresponsive outside. She endorses using cocaine and smoking daily. She states that she smoked cocaine at 3am and then went for a walk. She remembers feeling tired and then laying down on the ground. She then remembers coming around at the [facility] ED. She denies being treated for previous strokes, seizures, or other past medical history. She states that right now she just feels tired. She denies dizziness, nausea, or recent changes to her health.    LKW: Prior to 3am TNK given?: No, outside of window Mechanical Thrombectomy? No, no LVO Premorbid modified Rankin scale (mRS):  0-Completely asymptomatic and back to baseline post-stroke   ROS: Full ROS was performed and is negative except as noted in the HPI.   No past medical history on file.  No family history on file.  Social History:   has no history on file for tobacco use, alcohol use, and drug use.  Medications  Current Facility-Administered Medications:    [START ON 05/20/2023]  stroke: early stages of recovery book, , Does not apply, Once, Kirby Crigler, Mir M, MD   acetaminophen (TYLENOL) tablet 650 mg, 650 mg, Oral, Q6H PRN **OR** acetaminophen (TYLENOL) suppository 650 mg, 650 mg, Rectal, Q6H PRN, Kirby Crigler, Mir M, MD   aspirin tablet 325 mg, 325 mg, Oral, Daily, Soto, Johana, PA-C   enoxaparin (LOVENOX) injection 40 mg, 40 mg, Subcutaneous, Q24H, Kirby Crigler, Mir M, MD   hydrALAZINE (APRESOLINE) injection 5 mg, 5 mg, Intravenous, Q6H PRN, Kirby Crigler, Mir M, MD   LORazepam (ATIVAN) injection 1 mg, 1 mg, Intravenous, Once, Soto, Johana, PA-C   naloxone [facility]) injection 0.2 mg, 0.2 mg, Intravenous, PRN, Soto, Johana, PA-C, 0.2 mg at 05/19/23 1055   ondansetron (ZOFRAN) tablet 4  mg, 4 mg, Oral, Q6H PRN **OR** ondansetron (ZOFRAN) injection 4 mg, 4 mg, Intravenous, Q6H PRN, Kirby Crigler, Mir M, MD   Exam: Current vital signs: BP (!) 153/68 (BP Location: Left Arm)   Pulse 62   Temp 98.5 F (36.9 C) (Oral)   Resp 12   Ht 5\' 2"  (1.575 m)   Wt 63.5 kg   SpO2 98%   BMI 25.61 kg/m  Vital signs in last 24 hours: Temp:  [97.6 F (36.4 C)-98.5 F (36.9 C)] 98.5 F (36.9 C) (08/14 1539) Pulse Rate:  [51-65] 62 (08/14 1539) Resp:  [11-16] 12 (08/14 1539) BP: (148-184)/(67-88) 153/68 (08/14 1539) SpO2:  [97 %-100 %] 98 % (08/14 1539) Weight:  [63.5 kg] 63.5 kg (08/14 0727)  GENERAL: Awake, alert in NAD HEENT: - Normocephalic and atraumatic, dry mm, no LN++, no Thyromegally LUNGS - Clear to auscultation bilaterally with no wheezes CV - S1S2 RRR, no m/r/g, equal pulses bilaterally. ABDOMEN - Soft, nontender, nondistended with normoactive BS Ext: warm, well perfused, intact peripheral pulses, no edema  NEURO:  Mental Status: drowsy, awakens to voice but falls back asleep throughout exam, oriented x3 Language: speech is clear.  Naming, repetition, fluency, and comprehension intact. Cranial Nerves: PERRL . EOMI, visual fields full, no facial asymmetry, facial sensation intact, hearing intact, tongue/uvula/soft palate midline, normal sternocleidomastoid and trapezius muscle strength. No evidence of tongue atrophy or fasciculations Motor: Elevates all extremities antigravity Tone: is normal and bulk is normal Sensation- Intact to light touch bilaterally Coordination: FTN intact bilaterally,  no ataxia in BLE. Gait- deferred  NIHSS 1a Level of Conscious.: 1 1b LOC Questions: 0 1c LOC Commands: 0 2 Best Gaze: 0 3 Visual: 0 4 Facial Palsy: 0 5a Motor Arm - left: 0 5b Motor Arm - Right: 0 6a Motor Leg - Left: 0 6b Motor Leg - Right: 0 7 Limb Ataxia: 0 8 Sensory: 0 9 Best Language: 0 10 Dysarthria: 0 11 Extinct. and Inatten.: 0 TOTAL: 1   Labs I have  reviewed labs in epic and the results pertinent to this consultation are:   CBC    Component Value Date/Time   WBC 6.1 05/19/2023 0858   RBC 5.06 05/19/2023 0858   HGB 14.3 05/19/2023 0858   HCT 46.3 (H) 05/19/2023 0858   PLT 272 05/19/2023 0858   MCV 91.5 05/19/2023 0858   MCH 28.3 05/19/2023 0858   MCHC 30.9 05/19/2023 0858   RDW 14.6 05/19/2023 0858   LYMPHSABS 2.4 05/19/2023 0858   MONOABS 0.4 05/19/2023 0858   EOSABS 0.1 05/19/2023 0858   BASOSABS 0.0 05/19/2023 0858    CMP     Component Value Date/Time   NA 139 05/19/2023 0858   K 3.4 (L) 05/19/2023 0858   CL 104 05/19/2023 0858   CO2 26 05/19/2023 0858   GLUCOSE 87 05/19/2023 0858   BUN 11 05/19/2023 0858   CREATININE 0.73 05/19/2023 0858   CALCIUM 9.2 05/19/2023 0858   PROT 8.0 05/19/2023 0858   ALBUMIN 4.3 05/19/2023 0858   AST 16 05/19/2023 0858   ALT 18 05/19/2023 0858   ALKPHOS 111 05/19/2023 0858   BILITOT 0.5 05/19/2023 0858   GFRNONAA >60 05/19/2023 0858    Lipid Panel  No results found for: "CHOL", "TRIG", "HDL", "CHOLHDL", "VLDL", "LDLCALC", "LDLDIRECT"   Imaging I have reviewed the images obtained:  CT-head 1. No CT evidence of intracranial injury  2. Scattered nonspecific periventricular subcortical hypodense lesions, for example in the centrum semiovale on the left. Given patient's demographics, these could represent demyelinating lesions. Recommend further evaluation with a brain MRI with and without contrast.   CT Angio Head and Neck No large vessel occlusion, hemodynamically significant stenosis, or aneurysm in the head or neck.  MRI examination of the brain 1. 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 2. Multiple small foci of encephalomalacia with surrounding gliosis in the centrum semiovale and corona radiata as well as bilateral basal ganglia and right thalamus. While demyelinating disease is an important consideration in this age group, image appearance is  suggestive of chronic infarcts, particularly in the setting of a small acute left putaminal infarct.  3. Small amount of scattered nonspecific foci of T2 hyperintensity are seen within the white matter of the cerebral hemispheres may be related to demyelinating, vasculitis and sequela of prior inflammatory/infectious process.  Assessment:  52 y.o. female presenting with after being found unresponsive. MRI shows a left putamen infarct. She endorses cocaine use. Patient was transferred from [facility] hospital to [facility] for stroke work up.   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 MD to update note as needed.   Elmer Picker, DNP, FNP-BC [facility] Pager: [phone]  NEUROHOSPITALIST ADDENDUM Performed a face to face diagnostic evaluation.   I have reviewed the contents of history and physical exam as documented by PA/ARNP/Resident and agree with above documentation.  I have discussed and formulated the above plan as documented. Edits to the note have been made as needed.  Erick Blinks, MD [facility] [phone]   If 7pm to 7am, please call on call as listed on AMION.

## 2023-05-20 ENCOUNTER — Inpatient Hospital Stay (HOSPITAL_COMMUNITY): Payer: Self-pay

## 2023-05-20 DIAGNOSIS — I6389 Other cerebral infarction: Secondary | ICD-10-CM

## 2023-05-20 DIAGNOSIS — F17200 Nicotine dependence, unspecified, uncomplicated: Secondary | ICD-10-CM

## 2023-05-20 DIAGNOSIS — F1490 Cocaine use, unspecified, uncomplicated: Secondary | ICD-10-CM | POA: Insufficient documentation

## 2023-05-20 LAB — CBC
HCT: 43.8 % (ref 36.0–46.0)
Hemoglobin: 13.4 g/dL (ref 12.0–15.0)
MCH: 27.5 pg (ref 26.0–34.0)
MCHC: 30.6 g/dL (ref 30.0–36.0)
MCV: 89.9 fL (ref 80.0–100.0)
Platelets: 276 10*3/uL (ref 150–400)
RBC: 4.87 MIL/uL (ref 3.87–5.11)
RDW: 14.7 % (ref 11.5–15.5)
WBC: 5.7 10*3/uL (ref 4.0–10.5)
nRBC: 0 % (ref 0.0–0.2)

## 2023-05-20 LAB — COMPREHENSIVE METABOLIC PANEL
ALT: 13 U/L (ref 0–44)
AST: 20 U/L (ref 15–41)
Albumin: 3.3 g/dL — ABNORMAL LOW (ref 3.5–5.0)
Alkaline Phosphatase: 92 U/L (ref 38–126)
Anion gap: 15 (ref 5–15)
BUN: 14 mg/dL (ref 6–20)
CO2: 24 mmol/L (ref 22–32)
Calcium: 10.2 mg/dL (ref 8.9–10.3)
Chloride: 103 mmol/L (ref 98–111)
Creatinine, Ser: 0.86 mg/dL (ref 0.44–1.00)
GFR, Estimated: >60 mL/min (ref >60)
Glucose, Bld: 104 mg/dL — ABNORMAL HIGH (ref 70–99)
Potassium: 3.8 mmol/L (ref 3.5–5.1)
Sodium: 142 mmol/L (ref 135–145)
Total Bilirubin: 0.9 mg/dL (ref 0.3–1.2)
Total Protein: 6.4 g/dL — ABNORMAL LOW (ref 6.5–8.1)

## 2023-05-20 LAB — RAPID URINE DRUG SCREEN, HOSP PERFORMED
Amphetamines: NOT DETECTED
Barbiturates: NOT DETECTED
Benzodiazepines: NOT DETECTED
Cocaine: POSITIVE — AB
Opiates: NOT DETECTED
Tetrahydrocannabinol: NOT DETECTED

## 2023-05-20 LAB — LIPID PANEL
Cholesterol: 190 mg/dL (ref 0–200)
HDL: 58 mg/dL (ref >40)
LDL Cholesterol: 110 mg/dL — ABNORMAL HIGH (ref 0–99)
Total CHOL/HDL Ratio: 3.3 {ratio}
Triglycerides: 109 mg/dL (ref <150)
VLDL: 22 mg/dL (ref 0–40)

## 2023-05-20 LAB — ECHOCARDIOGRAM COMPLETE BUBBLE STUDY
Area-P 1/2: 3.65 cm2
S' Lateral: 2.5 cm

## 2023-05-20 LAB — HEMOGLOBIN A1C
Hgb A1c MFr Bld: 6.1 % — ABNORMAL HIGH (ref 4.8–5.6)
Mean Plasma Glucose: 128 mg/dL

## 2023-05-20 MED ORDER — NICOTINE 14 MG/24HR TD PT24: 14.0000 mg | MEDICATED_PATCH | Freq: Every day | TRANSDERMAL | Status: DC | PRN

## 2023-05-20 MED ORDER — ATORVASTATIN CALCIUM 40 MG PO TABS
40.0000 mg | ORAL_TABLET | Freq: Every day | ORAL | Status: DC
  Administered 2023-05-20 – 2023-05-21 (×2): 40 mg via ORAL
  Filled 2023-05-20 (×2): qty 1

## 2023-05-20 NOTE — Evaluation (Incomplete Revision)
 Occupational Therapy Evaluation Patient Details Name: Alexandra Buckley MRN: [id number] DOB: 1971/05/09 Today's Date: 05/20/2023   History of Present Illness Alexandra Buckley is a 52 y.o. female who denies any past medical history being admitted to the hospital with acute stroke. 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  Seems like this was not witnessed, bystander called 911 because patient was found sleeping on the ground next to the road.   Clinical Impression   This 52 yo female admitted with above presents to acute OT with PLOF of living on the street and being independent. Currently she is a bit off balance when up on her feet feeling as though she needs to hold onto something to ambulate to bathroom, shower, and back to bed thus I have her at a S level for in room ambulation.        If plan is discharge home, recommend the following: A little help with walking and/or transfers;Assist for transportation    Functional Status Assessment  Patient has had a recent decline in their functional status and demonstrates the ability to make significant improvements in function in a reasonable and predictable amount of time.  Equipment Recommendations  None recommended by OT       Precautions / Restrictions Precautions Precautions: Fall Restrictions Weight Bearing Restrictions: No      Mobility Bed Mobility Overal bed mobility: Independent                  Transfers Overall transfer level: Needs assistance Equipment used: None Transfers: Sit to/from Stand Sit to Stand: Supervision           General transfer comment: ambulating to and from bathroom pt was at a S level with "furniture surfing"  as she went stating she did not feel as steady on her feet as she would at baseline      Balance Overall balance assessment: Mild deficits observed, not formally tested                                         ADL either performed or assessed with clinical judgement   ADL                                         General ADL Comments: Overall S level due to pt states she does not feel as steady on her feet as she did prior to hospitalization     Vision Patient Visual Report: No change from baseline              Pertinent Vitals/Pain Pain Assessment Pain Assessment: No/denies pain     Extremity/Trunk Assessment Upper Extremity Assessment Upper Extremity Assessment: Overall WFL for tasks assessed           Communication Communication Communication: No apparent difficulties   Cognition Arousal: Alert Behavior During Therapy: WFL for tasks assessed/performed Overall Cognitive Status: Within Functional Limits for tasks assessed  Home Living Family/patient expects to be discharged to:: Shelter/Homeless                                        Prior Functioning/Environment Prior Level of Function : Independent/Modified Independent                        OT Problem List: Impaired balance (sitting and/or standing)      OT Treatment/Interventions: Self-care/ADL training;DME and/or AE instruction;Balance training;Patient/family education    OT Goals(Current goals can be found in the care plan section) Acute Rehab OT Goals Patient Stated Goal: to find out why she can't hold her urine and is concerned about bil great toes feeling numb OT Goal Formulation: With patient Time For Goal Achievement: 06/03/23 Potential to Achieve Goals: Good  OT Frequency: Min 1X/week       AM-PAC OT "6 Clicks" Daily Activity     Outcome Measure Help from another  person eating meals?: None Help from another person taking care of personal grooming?: A Little Help from another person toileting, which includes using toliet, bedpan, or urinal?: A Little Help from another person bathing (including washing, rinsing, drying)?: A Little Help from another person to put on and taking off regular upper body clothing?: A Little Help from another person to put on and taking off regular lower body clothing?: A Little 6 Click Score: 19   End of Session Nurse Communication: Mobility status (urinary urgency and Bil great toes numb)  Activity Tolerance: Patient tolerated treatment well Patient left: in bed;with call bell/phone within reach;with bed alarm set  OT Visit Diagnosis: Unsteadiness on feet (R26.81);Other abnormalities of gait and mobility (R26.89);Muscle weakness (generalized) (M62.81)                Time: 7829-5621 OT Time Calculation (min): 28 min Charges:  OT General Charges $OT Visit: 1 Visit OT Evaluation $OT Eval Moderate Complexity: 1 Mod OT Treatments $Self Care/Home Management : 8-22 mins  Lindon Romp OT Acute Rehabilitation Services Office [phone]    Evette Georges 05/20/2023, 3:13 PM

## 2023-05-20 NOTE — Progress Notes (Addendum)
 PROGRESS NOTE  Alexandra Buckley UJW:[id number] DOB: 05/20/1971   PCP: Pcp, No  [place]   DOA: 05/19/2023 LOS: 1  Chief complaints Chief Complaint  Patient presents with   Fall     Brief Narrative / Interim history: 52 year old F with PMH of cocaine and tobacco use disorder brought to [facility] ED by EMS after found sleeping on driver side by bystander who called EMS.  Patient woke up after 2 rounds of Narcan by EMS.  She admitted to snorting cocaine.  In ED, slightly hypertensive.  Basic labs without significant finding.  CT head showed some abnormality so MRI of brain was done which showed evidence of acute CVA in left putamen and multiple old encephalomalacia suggesting chronic infarct versus multiple sclerosis.  Neurology consulted and recommended transfer to [facility] hospital for further CVA workup.   CT angio head and neck without acute finding.  LDL 110.  A1c 6.1%.  TTE pending.  UDS positive for cocaine.  PT recommends acute skilled PT to increase independence and safety with mobility to allow discharge.  TOC on board     Subjective: Seen and examined earlier this morning.  Patient was sleeping quietly under started crying in pain due to "chest pain".  She stopped crying once we continued conversation.  She states she had chest pain since she came in.  Pain is anterior mid chest without radiation.  Tender to palpation.  Reports feeling weak.  She was unwilling to have echo done early in the morning.  I have emphasized the importance of this.  She also refused therapy earlier.  Objective: Vitals:   05/20/23 0325 05/20/23 0741 05/20/23 1117 05/20/23 1613  BP: (!) 170/74 (!) 168/87 (!) 153/57 (!) 141/60  Pulse: 65 69 85 72  Resp: 18 15 14    Temp: 98.1 F (36.7 C) 98.7 F (37.1 C) 99.4 F (37.4 C) 98.9 F (37.2 C)  TempSrc: Oral Oral Oral Oral  SpO2: 99% 98% 99% 99%  Weight:      Height:        Examination:  GENERAL: No apparent distress.  Nontoxic. HEENT: MMM.   Vision and hearing grossly intact.  NECK: Supple.  No apparent JVD.  RESP:  No IWOB.  Fair aeration bilaterally. CVS:  RRR. Heart sounds normal.  ABD/GI/GU: BS+. Abd soft, NTND.  MSK/EXT:  Moves extremities.  Tenderness to palpation over mid sternum SKIN: no apparent skin lesion or wound NEURO: Sleepy but wakes to voice.  Oriented appropriately. Speech clear. Cranial nerves II-XII  intact.  Motor symmetric in all extremities but slightly weak.  Normal tone. Light sensation intact in all dermatomes of upper and lower ext bilaterally. Patellar reflex symmetric.  No pronator drift.  Finger to nose intact. PSYCH: Was crying in pain when she woke up but stopped quickly  Procedures:  None  Microbiology summarized: None  Assessment and plan: Principal Problem:   Acute CVA (cerebrovascular accident) (HCC) Active Problems:   Cocaine use   Tobacco use disorder  Acute CVA: Found down on street by bystander who called EMS.  No focal neurodeficit breath some weakness unsteadiness.  CT head showed some abnormality so MRI of brain was done which showed evidence of acute CVA in left putamen and multiple old encephalomalacia suggesting chronic infarct versus multiple sclerosis.  UDS positive for cocaine.  LDL 110.  A1c 6.1%.  TTE pending -Neurology on board -On Plavix and aspirin.  Added Lipitor 40 mg daily -PT recommended skilled PT for safe discharge. -TOC  on board  Tobacco use disorder: Reports smoking about half a pack a day. -Encourage cessation -Nicotine patch  Cocaine use: Admits to snorting cocaine.  Denies IVDU.  UDS positive for cocaine -Counseled to refrain from cocaine.  Patient seems to be precontemplative.  When we discussed about risk of cocaine use, she states "it can happen even if you do not use" -TOC on board  Compliance -Counseled on the importance of compliance with evaluation  Disposition: [place] living [place]. Not sure where she would go when she leaves the hospital  but thinking about her mother's house.  Body mass index is 25.61 kg/m.          DVT prophylaxis:  enoxaparin (LOVENOX) injection 40 mg Start: 05/19/23 2200 SCDs Start: 05/19/23 1418  Code Status: Full code Family Communication: None at bedside Level of care: Telemetry Medical Status is: Inpatient Remains inpatient appropriate because: Acute CVA   Final disposition: TBD Consultants:  Neurology  55 minutes with more than 50% spent in reviewing records, counseling patient/family and coordinating care.   Sch Meds:  Scheduled Meds:  aspirin EC  81 mg Oral Daily   atorvastatin  40 mg Oral Daily   clopidogrel  75 mg Oral Daily   enoxaparin (LOVENOX) injection  40 mg Subcutaneous Q24H   LORazepam  1 mg Intravenous Once   Continuous Infusions: PRN Meds:.acetaminophen **OR** acetaminophen, hydrALAZINE, naLOXone (NARCAN)  injection, nicotine, ondansetron **OR** ondansetron (ZOFRAN) IV  Antimicrobials: Anti-infectives (From admission, onward)    None        I have personally reviewed the following labs and images: CBC: Recent Labs  Lab 05/19/23 0858 05/20/23 0026  WBC 6.1 5.7  NEUTROABS 3.2  --   HGB 14.3 13.4  HCT 46.3* 43.8  MCV 91.5 89.9  PLT 272 276   BMP &GFR Recent Labs  Lab 05/19/23 0858 05/20/23 0026  NA 139 142  K 3.4* 3.8  CL 104 103  CO2 26 24  GLUCOSE 87 104*  BUN 11 14  CREATININE 0.73 0.86  CALCIUM 9.2 10.2   Estimated Creatinine Clearance: 81.5 mL/min (by C-G formula based on SCr of 0.86 mg/dL). Liver & Pancreas: Recent Labs  Lab 05/19/23 0858 05/20/23 0026  AST 16 20  ALT 18 13  ALKPHOS 111 92  BILITOT 0.5 0.9  PROT 8.0 6.4*  ALBUMIN 4.3 3.3*   No results for input(s): "LIPASE", "AMYLASE" in the last 168 hours. No results for input(s): "AMMONIA" in the last 168 hours. Diabetic: Recent Labs    05/19/23 0858 05/19/23 1936  HGBA1C 6.1* 5.9*   No results for input(s): "GLUCAP" in the last 168 hours. Cardiac Enzymes: No  results for input(s): "CKTOTAL", "CKMB", "CKMBINDEX", "TROPONINI" in the last 168 hours. No results for input(s): "PROBNP" in the last 8760 hours. Coagulation Profile: No results for input(s): "INR", "PROTIME" in the last 168 hours. Thyroid Function Tests: No results for input(s): "TSH", "T4TOTAL", "FREET4", "T3FREE", "THYROIDAB" in the last 72 hours. Lipid Profile: Recent Labs    05/20/23 0026  CHOL 190  HDL 58  LDLCALC 110*  TRIG 109  CHOLHDL 3.3   Anemia Panel: No results for input(s): "VITAMINB12", "FOLATE", "FERRITIN", "TIBC", "IRON", "RETICCTPCT" in the last 72 hours. Urine analysis: No results found for: "COLORURINE", "APPEARANCEUR", "LABSPEC", "PHURINE", "GLUCOSEU", "HGBUR", "BILIRUBINUR", "KETONESUR", "PROTEINUR", "UROBILINOGEN", "NITRITE", "LEUKOCYTESUR" Sepsis Labs: Invalid input(s): "PROCALCITONIN", "LACTICIDVEN"  Microbiology: No results found for this or any previous visit (from the past 240 hour(s)).  Radiology Studies: No results found.  Sharbel Sahagun T. Jesi Jurgens [facility]  If 7PM-7AM, please contact night-coverage [email or web address] 05/20/2023, 4:46 PM

## 2023-05-20 NOTE — Progress Notes (Signed)
 PT Cancellation Note  Patient Details Name: Alexandra Buckley MRN: [id number] DOB: 10-28-1970   Cancelled Treatment:    Reason Eval/Treat Not Completed: Patient at procedure or test/unavailable  Currently having test conducted in room. Will follow-up for PT assessment later today.  Kathlyn Sacramento, PT, DPT [facility]  Rehabilitation Services Physical Therapist Office: [phone] Website: [email or web address]

## 2023-05-20 NOTE — Progress Notes (Incomplete Revision)
 PT Cancellation Note  Patient Details Name: Alexandra Buckley MRN: [id number] DOB: 31-Jul-1971   Cancelled Treatment:    Reason Eval/Treat Not Completed: Patient declined, no reason specified  Unwilling to participate at this time. Not replying to questions, rolls over and closes her eyes when attempting interview. Will follow for evaluation (likely tomorrow) as schedule allows and pt is willing. Will notify RN to contact PT if pt becomes more agreeable to work with staff today.  Kathlyn Sacramento, PT, DPT [facility]  Rehabilitation Services Physical Therapist Office: [phone] Website: [email or web address]

## 2023-05-20 NOTE — TOC CAGE-AID Note (Incomplete Revision)
 Transition of Care [facility]) - CAGE-AID Screening   Patient Details  Name: Alexandra Buckley MRN: [id number] Date of Birth: [birth year]  Transition of [facility]) CM/SW Contact:    Kermit Balo, RN Phone Number: 05/20/2023, 11:50 AM   Clinical Narrative:  CM provided her with inpatient/outpatient drug counseling resources.   CAGE-AID Screening: Substance Abuse Screening unable to be completed due to: : Patient Refused             Substance Abuse Education Offered: Yes  Substance abuse interventions: Transport planner

## 2023-05-20 NOTE — Evaluation (Addendum)
 Occupational Therapy Evaluation Patient Details Name: Alexandra Buckley MRN: [id number] DOB: 1971/05/09 Today's Date: 05/20/2023   History of Present Illness Alexandra Buckley is a 52 y.o. female who denies any past medical history being admitted to the hospital with acute stroke. 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  Seems like this was not witnessed, bystander called 911 because patient was found sleeping on the ground next to the road.   Clinical Impression   This 52 yo female admitted with above presents to acute OT with PLOF of living on the street and being independent. Currently she is a bit off balance when up on her feet feeling as though she needs to hold onto something to ambulate to bathroom, shower, and back to bed thus I have her at a S level for in room ambulation.        If plan is discharge home, recommend the following: A little help with walking and/or transfers;Assist for transportation    Functional Status Assessment  Patient has had a recent decline in their functional status and demonstrates the ability to make significant improvements in function in a reasonable and predictable amount of time.  Equipment Recommendations  None recommended by OT       Precautions / Restrictions Precautions Precautions: Fall Restrictions Weight Bearing Restrictions: No      Mobility Bed Mobility Overal bed mobility: Independent                  Transfers Overall transfer level: Needs assistance Equipment used: None Transfers: Sit to/from Stand Sit to Stand: Supervision           General transfer comment: ambulating to and from bathroom pt was at a S level with "furniture surfing"  as she went stating she did not feel as steady on her feet as she would at baseline      Balance Overall balance assessment: Mild deficits observed, not formally tested                                         ADL either performed or assessed with clinical judgement   ADL                                         General ADL Comments: Overall S level due to pt states she does not feel as steady on her feet as she did prior to hospitalization     Vision Patient Visual Report: No change from baseline              Pertinent Vitals/Pain Pain Assessment Pain Assessment: No/denies pain     Extremity/Trunk Assessment Upper Extremity Assessment Upper Extremity Assessment: Overall WFL for tasks assessed           Communication Communication Communication: No apparent difficulties   Cognition Arousal: Alert Behavior During Therapy: WFL for tasks assessed/performed Overall Cognitive Status: Within Functional Limits for tasks assessed  Home Living Family/patient expects to be discharged to:: Shelter/Homeless                                        Prior Functioning/Environment Prior Level of Function : Independent/Modified Independent                        OT Problem List: Impaired balance (sitting and/or standing)      OT Treatment/Interventions: Self-care/ADL training;DME and/or AE instruction;Balance training;Patient/family education    OT Goals(Current goals can be found in the care plan section) Acute Rehab OT Goals Patient Stated Goal: to find out why she can't hold her urine and is concerned about bil great toes feeling numb OT Goal Formulation: With patient Time For Goal Achievement: 06/03/23 Potential to Achieve Goals: Good  OT Frequency: Min 1X/week       AM-PAC OT "6 Clicks" Daily Activity     Outcome Measure Help from another  person eating meals?: None Help from another person taking care of personal grooming?: A Little Help from another person toileting, which includes using toliet, bedpan, or urinal?: A Little Help from another person bathing (including washing, rinsing, drying)?: A Little Help from another person to put on and taking off regular upper body clothing?: A Little Help from another person to put on and taking off regular lower body clothing?: A Little 6 Click Score: 19   End of Session Nurse Communication: Mobility status (urinary urgency and Bil great toes numb)  Activity Tolerance: Patient tolerated treatment well Patient left: in bed;with call bell/phone within reach;with bed alarm set  OT Visit Diagnosis: Unsteadiness on feet (R26.81);Other abnormalities of gait and mobility (R26.89);Muscle weakness (generalized) (M62.81)                Time: 7829-5621 OT Time Calculation (min): 28 min Charges:  OT General Charges $OT Visit: 1 Visit OT Evaluation $OT Eval Moderate Complexity: 1 Mod OT Treatments $Self Care/Home Management : 8-22 mins  Lindon Romp OT Acute Rehabilitation Services Office [phone]    Evette Georges 05/20/2023, 3:13 PM

## 2023-05-20 NOTE — TOC Transition Note (Incomplete Revision)
 Transition of Care [facility]) - CM/SW Discharge Note   Patient Details  Name: Alexandra Buckley MRN: [id number] Date of Birth: [birth year]  Transition of [facility]) CM/SW Contact:  Kermit Balo, RN Phone Number: 05/20/2023, 3:10 PM   Clinical Narrative:    Patient states she is homeless. CM offered her shelter list and she refused.  No PCP. Pt in agreement with CM getting her a PCP. Information on the AVS. CM will provide the charge RN with cab voucher for transport to where she wants to go in [place].    Final next level of care: Homeless Shelter Barriers to Discharge: No Barriers Identified   Patient Goals and CMS Choice      Discharge Placement                         Discharge Plan and Services Additional resources added to the After Visit Summary for                                       Social Determinants of Health (SDOH) Interventions SDOH Screenings   Food Insecurity: Food Insecurity Present (05/19/2023)  Housing: High Risk (05/19/2023)  Transportation Needs: Unmet Transportation Needs (05/19/2023)  Utilities: At Risk (05/19/2023)     Readmission Risk Interventions     No data to display

## 2023-05-20 NOTE — Progress Notes (Addendum)
 OT Cancellation Note  Patient Details Name: Coni Homesley MRN: [id number] DOB: 03/23/1971   Cancelled Treatment:    Reason Eval/Treat Not Completed: Patient at procedure or test/ unavailable. Technician coming to room to do heart test per RN.  Lindon Romp OT Acute Rehabilitation Services Office [phone]    Evette Georges 05/20/2023, 1:33 PM

## 2023-05-20 NOTE — Evaluation (Signed)
 Physical Therapy Evaluation and Discharge Patient Details Name: Alexandra Buckley MRN: [id number] DOB: April 01, 1971 Today's Date: 05/20/2023  History of Present Illness  Alexandra Buckley is a 52 y.o. female admitted 8/14. 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  Clinical Impression  Pt admitted with above diagnosis. Requests trying a SPC to use. Provided education on use, able to mobilize at a mod I level albeit moving slowly. Complaints of toe numbness bil feet. BERG of 50/56 suggesting no AD use, however pt feels more confident with SPC use. Educated on safety, awareness, and resources. Has no further questions. Declines further training including gait without AD, quad cane, or RW. Declines navigating steps. Is appreciative of PT visit. Pt currently with functional limitations due to the deficits listed below (see PT Problem List). Pt will benefit from acute skilled PT to increase their independence and safety with mobility to allow discharge.           If plan is discharge home, recommend the following: Assist for transportation     Equipment Recommendations Gilmer Mor     Functional Status Assessment Patient has had a recent decline in their functional status and demonstrates the ability to make significant improvements in function in a reasonable and predictable amount of time.     Precautions / Restrictions Precautions Precautions: Fall Restrictions Weight Bearing Restrictions: No      Mobility  Bed Mobility Overal bed mobility: Independent                  Transfers Overall transfer level: Modified independent Equipment used: Straight cane, None Transfers: Sit to/from Stand Sit to Stand: Modified  independent (Device/Increase time)           General transfer comment: Slow to rise, more confident with SPC for support, no assist needed    Ambulation/Gait Ambulation/Gait assistance: Modified independent (Device/Increase time) Gait Distance (Feet): 105 Feet Assistive device: Straight cane Gait Pattern/deviations: Step-through pattern, Wide base of support, Decreased stride length Gait velocity: decr Gait velocity interpretation: 1.31 - 2.62 ft/sec, indicative of limited community ambulator   General Gait Details: initially slow but progressed pace with confidence. Educated on [facility] use for support. Declines ambulating without, or using quad cane or RW. Feels confident with SPC for support. No overt LOB or buckling noted.  Stairs Stairs:  Medical laboratory scientific officer)          Wheelchair Mobility     Tilt Bed    Modified Rankin (Stroke Patients Only) Modified Rankin (Stroke Patients Only) Pre-Morbid Rankin Score: No symptoms Modified Rankin: Moderate disability     Balance Overall balance assessment: Modified Independent                               Standardized Balance Assessment Standardized Balance Assessment : Berg Balance Test Berg Balance Test Sit to Stand: Able to stand without using hands and stabilize independently Standing Unsupported: Able to stand safely 2 minutes Sitting with Back Unsupported but Feet Supported on Floor or Stool: Able to sit safely and securely 2 minutes Stand to Sit: Sits safely with minimal use of hands Transfers: Able to transfer safely, minor use of hands Standing Unsupported with Eyes Closed: Able to stand 10 seconds safely  Standing Ubsupported with Feet Together: Able to place feet together independently and stand for 1 minute with supervision From Standing, Reach Forward with Outstretched Arm: Can reach confidently >25 cm (10") From Standing Position, Pick up Object from Floor: Able to pick up shoe, needs supervision From Standing  Position, Turn to Look Behind Over each Shoulder: Looks behind from both sides and weight shifts well Turn 360 Degrees: Able to turn 360 degrees safely one side only in 4 seconds or less Standing Unsupported, Alternately Place Feet on Step/Stool: Able to stand independently and complete 8 steps >20 seconds Standing Unsupported, One Foot in Front: Able to plae foot ahead of the other independently and hold 30 seconds Standing on One Leg: Able to lift leg independently and hold 5-10 seconds Total Score: 50         Pertinent Vitals/Pain Pain Assessment Pain Assessment: No/denies pain    Home Living Family/patient expects to be discharged to:: Shelter/Homeless                   Additional Comments: Limited historian, but reports no stairs to navigate    Prior Function Prior Level of Function : Independent/Modified Independent                     Extremity/Trunk Assessment   Upper Extremity Assessment Upper Extremity Assessment: Defer to OT evaluation    Lower Extremity Assessment Lower Extremity Assessment: Overall WFL for tasks assessed       Communication   Communication Communication: No apparent difficulties  Cognition Arousal: Alert Behavior During Therapy: WFL for tasks assessed/performed Overall Cognitive Status: Within Functional Limits for tasks assessed                                          General Comments General comments (skin integrity, edema, etc.): Educated on safety, awareness, and resources.    Exercises     Assessment/Plan    PT Assessment Patient does not need any further PT services  PT Problem List         PT Treatment Interventions      PT Goals (Current goals can be found in the Care Plan section)  Acute Rehab PT Goals Patient Stated Goal: None stated PT Goal Formulation: All assessment and education complete, DC therapy    Frequency       Co-evaluation               AM-PAC PT "6 Clicks"  Mobility  Outcome Measure Help needed turning from your back to your side while in a flat bed without using bedrails?: None Help needed moving from lying on your back to sitting on the side of a flat bed without using bedrails?: None Help needed moving to and from a bed to a chair (including a wheelchair)?: None Help needed standing up from a chair using your arms (e.g., wheelchair or bedside chair)?: None Help needed to walk in hospital room?: None Help needed climbing 3-5 steps with a railing? : A Little 6 Click Score: 23    End of Session   Activity Tolerance: Patient tolerated treatment well Patient left: in bed;with call bell/phone within reach;with bed alarm set Nurse Communication: Mobility status PT Visit Diagnosis: Other abnormalities of gait and mobility (R26.89);History of falling (Z91.81)    Time: 1610-9604 PT Time Calculation (min) (ACUTE ONLY): 12 min   Charges:  PT Evaluation $PT Eval Low Complexity: 1 Low   PT General Charges $$ ACUTE PT VISIT: 1 Visit         Kathlyn Sacramento, PT, DPT [facility]  Rehabilitation Services Physical Therapist Office: [phone] Website: [email or web address]   Berton Mount 05/20/2023, 4:25 PM

## 2023-05-20 NOTE — Progress Notes (Incomplete Revision)
 STROKE TEAM PROGRESS NOTE   BRIEF HPI Alexandra Buckley is a 52 y.o. female with a past medical history of drug use presenting after being found unresponsive outside. She endorses using cocaine and smoking daily. She states that she smoked cocaine at 3am and then went for a walk. She remembers feeling tired and then laying down on the ground. She then remembers coming around at the [facility] ED. She denies being treated for previous strokes, seizures, or other past medical history. She states that right now she just feels tired. She denies dizziness, nausea, or recent changes to her health.    SIGNIFICANT HOSPITAL EVENTS 8/14: presented to ED to hospital admission  INTERIM HISTORY/SUBJECTIVE Pt is minimally responsive on exam. Discussed the significance of a stroke (found on imaging) at this age. We emphasized the need for treating risk factors including HTN, HLD, substance use, DM, etc. Discussed nicotine patch for smoking cessation help.  Discussed the workup for stroke etiology with patient. Informed the patient about DAPT therapy indicated for 3 wks and that she needs to take aspirin for life. Told patient that she may need rehab per PT / OT evaluation.    OBJECTIVE  CBC    Component Value Date/Time   WBC 5.7 05/20/2023 0026   RBC 4.87 05/20/2023 0026   HGB 13.4 05/20/2023 0026   HCT 43.8 05/20/2023 0026   PLT 276 05/20/2023 0026   MCV 89.9 05/20/2023 0026   MCH 27.5 05/20/2023 0026   MCHC 30.6 05/20/2023 0026   RDW 14.7 05/20/2023 0026   LYMPHSABS 2.4 05/19/2023 0858   MONOABS 0.4 05/19/2023 0858   EOSABS 0.1 05/19/2023 0858   BASOSABS 0.0 05/19/2023 0858    BMET    Component Value Date/Time   NA 142 05/20/2023 0026   K 3.8 05/20/2023 0026   CL 103 05/20/2023 0026   CO2 24 05/20/2023 0026   GLUCOSE 104 (H) 05/20/2023 0026   BUN 14 05/20/2023 0026   CREATININE 0.86 05/20/2023 0026   CALCIUM 10.2 05/20/2023 0026   GFRNONAA >60 05/20/2023 0026    IMAGING past 24 hours No  results found.  Vitals:   05/20/23 0325 05/20/23 0741 05/20/23 1117 05/20/23 1613  BP: (!) 170/74 (!) 168/87 (!) 153/57 (!) 141/60  Pulse: 65 69 85 72  Resp: 18 15 14    Temp: 98.1 F (36.7 C) 98.7 F (37.1 C) 99.4 F (37.4 C) 98.9 F (37.2 C)  TempSrc: Oral Oral Oral Oral  SpO2: 99% 98% 99% 99%  Weight:      Height:         PHYSICAL EXAM General:  somnolent, does not cooperate in discussion Psych:  Mood and affect appropriate for situation CV: Regular rate and rhythm on monitor Respiratory:  Regular, unlabored respirations on room air GI: Abdomen soft and nontender   NEURO:  Mental Status: AA&Ox3, patient is not able to give clear and coherent history Speech/Language: speech is without dysarthria or aphasia.  Naming, repetition, fluency, and comprehension intact.  Cranial Nerves:  II: PERRL. Visual fields full.  III, IV, VI: EOMI. Eyelids elevate symmetrically.  V: Sensation is intact to light touch and symmetrical to face.  VII: Face is symmetrical resting and smiling VIII: hearing intact to voice. IX, X: Palate elevates symmetrically. Phonation is normal.  ZO:[id number] shrug 5/5. XII: tongue is midline without fasciculations. Motor: 5/5 strength to all muscle groups tested.  Tone: is normal and bulk is normal Sensation- Intact to light touch bilaterally. Extinction absent to light touch  to DSS.   Coordination: FTN intact bilaterally, HKS: no ataxia in BLE.No drift.  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 Scattered nonspecific periventricular subcortical hypodense lesions.    CTA head & neck no LVO or stenosis or aneurysm.  MRI  Small acute infarct in the posterior aspect of the left putamen.  2D Echo pending LDL 110 HgbA1c 5.9 VTE prophylaxis - lovenox 40 No antithrombotic prior to admission, now on aspirin  81 mg daily and clopidogrel 75 mg daily for 3 weeks and then aspirin alone. Therapy recommendations:  pending Disposition:  pending  Hx of Stroke/TIA Possible past strokes per 8/14 MRI: Multiple small foci of encephalomalacia with surrounding gliosis in the centrum semiovale and corona radiata as well as bilateral basal ganglia and right thalamus. While demyelinating disease is an important consideration in this age group, image appearance is suggestive of chronic infarcts, particularly in the setting of a small acute left putaminal infarct.   Hypertension (no formal past assessment) Home meds:  none Unstable, SBP 150-170 BP goal: normotensive (24hr past LKN)  Hyperlipidemia Home meds:  none, resumed in hospital LDL 110, goal < 70 Added atorvastatin 40 mg once daily  Continue statin at discharge  Prediabetes HgbA1c 5.9, goal < 7.0 Recommend close follow-up with PCP to prevent DM complications  Tobacco Abuse Patient smokes but did not specify amount      Ready to quit? No Nicotine replacement therapy offered and denied by patient, PRN nicotine 14 mg patch if changes mind  Substance Abuse Patient uses cocaine. Did not provide any more specifics.       Ready to quit? N/A  Dysphagia Patient has post-stroke dysphagia, SLP consulted    Diet   Diet regular Room service appropriate? Yes; Fluid consistency: Thin   Advance diet as tolerated  Other Stroke Risk Factors ETOH use, advised to drink no more than 1 drink(s) a day   Hospital day # 1  Alexandra Dare, MD PGY-1 Psychiatry Resident 05/20/2023, 4:47 PM  STROKE MD NOTE :  I have personally obtained history,examined this patient, reviewed notes, independently viewed imaging studies, participated in medical decision making and plan of care.ROS completed by me personally and pertinent positives fully documented  I have made any additions or clarifications directly to the above note. Agree with note above.  She presented with  history of cocaine abuse and altered mental status and MRI scan shows small left internal capsule lacunar infarct as well as several other old lacunar infarcts of remote age.  Patient counseled to change her lifestyle and to stop doing cocaine, marijuana was smoking.  Recommend aspirin and Plavix for 3 weeks followed by aspirin alone and aggressive risk factor modification.  Continue ongoing stroke workup.  Therapy consults.  No family available at the bedside.  Greater than 50% time during this 50-minute visit were spent on counseling and coordination of care about her lacunar stroke and cocaine substance abuse discussion about stroke prevention and treatment and answering questions.  Delia Heady, MD Medical Director [facility] Pager: [phone] 05/20/2023 4:57 PM    To contact Stroke Continuity provider, please refer to [email or web address]. After hours, contact General Neurology

## 2023-05-20 NOTE — Progress Notes (Incomplete Revision)
 PT Cancellation Note  Patient Details Name: Alexandra Buckley MRN: [id number] DOB: 10-28-1970   Cancelled Treatment:    Reason Eval/Treat Not Completed: Patient at procedure or test/unavailable  Currently having test conducted in room. Will follow-up for PT assessment later today.  Kathlyn Sacramento, PT, DPT [facility]  Rehabilitation Services Physical Therapist Office: [phone] Website: [email or web address]

## 2023-05-20 NOTE — Evaluation (Incomplete Revision)
 Physical Therapy Evaluation and Discharge Patient Details Name: Alexandra Buckley MRN: [id number] DOB: April 01, 1971 Today's Date: 05/20/2023  History of Present Illness  Alexandra Buckley is a 52 y.o. female admitted 8/14. 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  Clinical Impression  Pt admitted with above diagnosis. Requests trying a SPC to use. Provided education on use, able to mobilize at a mod I level albeit moving slowly. Complaints of toe numbness bil feet. BERG of 50/56 suggesting no AD use, however pt feels more confident with SPC use. Educated on safety, awareness, and resources. Has no further questions. Declines further training including gait without AD, quad cane, or RW. Declines navigating steps. Is appreciative of PT visit. Pt currently with functional limitations due to the deficits listed below (see PT Problem List). Pt will benefit from acute skilled PT to increase their independence and safety with mobility to allow discharge.           If plan is discharge home, recommend the following: Assist for transportation     Equipment Recommendations [place] Mor     Functional Status Assessment Patient has had a recent decline in their functional status and demonstrates the ability to make significant improvements in function in a reasonable and predictable amount of time.     Precautions / Restrictions Precautions Precautions: Fall Restrictions Weight Bearing Restrictions: No      Mobility  Bed Mobility Overal bed mobility: Independent                  Transfers Overall transfer level: Modified independent Equipment used: Straight cane, None Transfers: Sit to/from Stand Sit to Stand: Modified  independent (Device/Increase time)           General transfer comment: Slow to rise, more confident with SPC for support, no assist needed    Ambulation/Gait Ambulation/Gait assistance: Modified independent (Device/Increase time) Gait Distance (Feet): 105 Feet Assistive device: Straight cane Gait Pattern/deviations: Step-through pattern, Wide base of support, Decreased stride length Gait velocity: decr Gait velocity interpretation: 1.31 - 2.62 ft/sec, indicative of limited community ambulator   General Gait Details: initially slow but progressed pace with confidence. Educated on [facility] use for support. Declines ambulating without, or using quad cane or RW. Feels confident with SPC for support. No overt LOB or buckling noted.  Stairs Stairs:  Medical laboratory scientific officer)          Wheelchair Mobility     Tilt Bed    Modified Rankin (Stroke Patients Only) Modified Rankin (Stroke Patients Only) Pre-Morbid Rankin Score: No symptoms Modified Rankin: Moderate disability     Balance Overall balance assessment: Modified Independent                               Standardized Balance Assessment Standardized Balance Assessment : Berg Balance Test Berg Balance Test Sit to Stand: Able to stand without using hands and stabilize independently Standing Unsupported: Able to stand safely 2 minutes Sitting with Back Unsupported but Feet Supported on Floor or Stool: Able to sit safely and securely 2 minutes Stand to Sit: Sits safely with minimal use of hands Transfers: Able to transfer safely, minor use of hands Standing Unsupported with Eyes Closed: Able to stand 10 seconds safely  Standing Ubsupported with Feet Together: Able to place feet together independently and stand for 1 minute with supervision From Standing, Reach Forward with Outstretched Arm: Can reach confidently >25 cm (10") From Standing Position, Pick up Object from Floor: Able to pick up shoe, needs supervision From Standing  Position, Turn to Look Behind Over each Shoulder: Looks behind from both sides and weight shifts well Turn 360 Degrees: Able to turn 360 degrees safely one side only in 4 seconds or less Standing Unsupported, Alternately Place Feet on Step/Stool: Able to stand independently and complete 8 steps >20 seconds Standing Unsupported, One Foot in Front: Able to plae foot ahead of the other independently and hold 30 seconds Standing on One Leg: Able to lift leg independently and hold 5-10 seconds Total Score: 50         Pertinent Vitals/Pain Pain Assessment Pain Assessment: No/denies pain    Home Living Family/patient expects to be discharged to:: Shelter/Homeless                   Additional Comments: Limited historian, but reports no stairs to navigate    Prior Function Prior Level of Function : Independent/Modified Independent                     Extremity/Trunk Assessment   Upper Extremity Assessment Upper Extremity Assessment: Defer to OT evaluation    Lower Extremity Assessment Lower Extremity Assessment: Overall WFL for tasks assessed       Communication   Communication Communication: No apparent difficulties  Cognition Arousal: Alert Behavior During Therapy: WFL for tasks assessed/performed Overall Cognitive Status: Within Functional Limits for tasks assessed                                          General Comments General comments (skin integrity, edema, etc.): Educated on safety, awareness, and resources.    Exercises     Assessment/Plan    PT Assessment Patient does not need any further PT services  PT Problem List         PT Treatment Interventions      PT Goals (Current goals can be found in the Care Plan section)  Acute Rehab PT Goals Patient Stated Goal: None stated PT Goal Formulation: All assessment and education complete, DC therapy    Frequency       Co-evaluation               AM-PAC PT "6 Clicks"  Mobility  Outcome Measure Help needed turning from your back to your side while in a flat bed without using bedrails?: None Help needed moving from lying on your back to sitting on the side of a flat bed without using bedrails?: None Help needed moving to and from a bed to a chair (including a wheelchair)?: None Help needed standing up from a chair using your arms (e.g., wheelchair or bedside chair)?: None Help needed to walk in hospital room?: None Help needed climbing 3-5 steps with a railing? : A Little 6 Click Score: 23    End of Session   Activity Tolerance: Patient tolerated treatment well Patient left: in bed;with call bell/phone within reach;with bed alarm set Nurse Communication: Mobility status PT Visit Diagnosis: Other abnormalities of gait and mobility (R26.89);History of falling (Z91.81)    Time: 1610-9604 PT Time Calculation (min) (ACUTE ONLY): 12 min   Charges:  PT Evaluation $PT Eval Low Complexity: 1 Low   PT General Charges $$ ACUTE PT VISIT: 1 Visit         Kathlyn Sacramento, PT, DPT [facility]  Rehabilitation Services Physical Therapist Office: [phone] Website: [email or web address]   Berton Mount 05/20/2023, 4:25 PM

## 2023-05-20 NOTE — TOC CAGE-AID Note (Addendum)
 Transition of Care [facility]) - CAGE-AID Screening   Patient Details  Name: Alexandra Buckley MRN: [id number] Date of Birth: [birth year]  Transition of Care [facility]) CM/SW Contact:    Kermit Balo, RN Phone Number: 05/20/2023, 11:50 AM   Clinical Narrative:  CM provided her with inpatient/outpatient drug counseling resources.   CAGE-AID Screening: Substance Abuse Screening unable to be completed due to: : Patient Refused             Substance Abuse Education Offered: Yes  Substance abuse interventions: Transport planner

## 2023-05-20 NOTE — TOC Transition Note (Addendum)
 Transition of Care [facility]) - CM/SW Discharge Note   Patient Details  Name: Alexandra Buckley MRN: [id number] Date of Birth: [birth year]  Transition of Care [facility]) CM/SW Contact:  Kermit Balo, RN Phone Number: 05/20/2023, 3:10 PM   Clinical Narrative:    Patient states she is homeless. CM offered her shelter list and she refused.  No PCP. Pt in agreement with CM getting her a PCP. Information on the AVS. CM will provide the charge RN with cab voucher for transport to where she wants to go in [place].    Final next level of care: Homeless Shelter Barriers to Discharge: No Barriers Identified   Patient Goals and CMS Choice      Discharge Placement                         Discharge Plan and Services Additional resources added to the After Visit Summary for                                       Social Determinants of Health (SDOH) Interventions SDOH Screenings   Food Insecurity: Food Insecurity Present (05/19/2023)  Housing: High Risk (05/19/2023)  Transportation Needs: Unmet Transportation Needs (05/19/2023)  Utilities: At Risk (05/19/2023)     Readmission Risk Interventions     No data to display

## 2023-05-20 NOTE — Progress Notes (Signed)
 PT Cancellation Note  Patient Details Name: Alexandra Buckley MRN: [id number] DOB: 31-Jul-1971   Cancelled Treatment:    Reason Eval/Treat Not Completed: Patient declined, no reason specified  Unwilling to participate at this time. Not replying to questions, rolls over and closes her eyes when attempting interview. Will follow for evaluation (likely tomorrow) as schedule allows and pt is willing. Will notify RN to contact PT if pt becomes more agreeable to work with staff today.  Kathlyn Sacramento, PT, DPT [facility]  Rehabilitation Services Physical Therapist Office: [phone] Website: [email or web address]

## 2023-05-20 NOTE — Progress Notes (Addendum)
 STROKE TEAM PROGRESS NOTE   BRIEF HPI Alexandra Buckley is a 52 y.o. female with a past medical history of drug use presenting after being found unresponsive outside. She endorses using cocaine and smoking daily. She states that she smoked cocaine at 3am and then went for a walk. She remembers feeling tired and then laying down on the ground. She then remembers coming around at the [facility] ED. She denies being treated for previous strokes, seizures, or other past medical history. She states that right now she just feels tired. She denies dizziness, nausea, or recent changes to her health.    SIGNIFICANT HOSPITAL EVENTS 8/14: presented to ED to hospital admission  INTERIM HISTORY/SUBJECTIVE Pt is minimally responsive on exam. Discussed the significance of a stroke (found on imaging) at this age. We emphasized the need for treating risk factors including HTN, HLD, substance use, DM, etc. Discussed nicotine patch for smoking cessation help.  Discussed the workup for stroke etiology with patient. Informed the patient about DAPT therapy indicated for 3 wks and that she needs to take aspirin for life. Told patient that she may need rehab per PT / OT evaluation.    OBJECTIVE  CBC    Component Value Date/Time   WBC 5.7 05/20/2023 0026   RBC 4.87 05/20/2023 0026   HGB 13.4 05/20/2023 0026   HCT 43.8 05/20/2023 0026   PLT 276 05/20/2023 0026   MCV 89.9 05/20/2023 0026   MCH 27.5 05/20/2023 0026   MCHC 30.6 05/20/2023 0026   RDW 14.7 05/20/2023 0026   LYMPHSABS 2.4 05/19/2023 0858   MONOABS 0.4 05/19/2023 0858   EOSABS 0.1 05/19/2023 0858   BASOSABS 0.0 05/19/2023 0858    BMET    Component Value Date/Time   NA 142 05/20/2023 0026   K 3.8 05/20/2023 0026   CL 103 05/20/2023 0026   CO2 24 05/20/2023 0026   GLUCOSE 104 (H) 05/20/2023 0026   BUN 14 05/20/2023 0026   CREATININE 0.86 05/20/2023 0026   CALCIUM 10.2 05/20/2023 0026   GFRNONAA >60 05/20/2023 0026    IMAGING past 24 hours No  results found.  Vitals:   05/20/23 0325 05/20/23 0741 05/20/23 1117 05/20/23 1613  BP: (!) 170/74 (!) 168/87 (!) 153/57 (!) 141/60  Pulse: 65 69 85 72  Resp: 18 15 14    Temp: 98.1 F (36.7 C) 98.7 F (37.1 C) 99.4 F (37.4 C) 98.9 F (37.2 C)  TempSrc: Oral Oral Oral Oral  SpO2: 99% 98% 99% 99%  Weight:      Height:         PHYSICAL EXAM General:  somnolent, does not cooperate in discussion Psych:  Mood and affect appropriate for situation CV: Regular rate and rhythm on monitor Respiratory:  Regular, unlabored respirations on room air GI: Abdomen soft and nontender   NEURO:  Mental Status: AA&Ox3, patient is not able to give clear and coherent history Speech/Language: speech is without dysarthria or aphasia.  Naming, repetition, fluency, and comprehension intact.  Cranial Nerves:  II: PERRL. Visual fields full.  III, IV, VI: EOMI. Eyelids elevate symmetrically.  V: Sensation is intact to light touch and symmetrical to face.  VII: Face is symmetrical resting and smiling VIII: hearing intact to voice. IX, X: Palate elevates symmetrically. Phonation is normal.  ZO:[id number] shrug 5/5. XII: tongue is midline without fasciculations. Motor: 5/5 strength to all muscle groups tested.  Tone: is normal and bulk is normal Sensation- Intact to light touch bilaterally. Extinction absent to light touch  to DSS.   Coordination: FTN intact bilaterally, HKS: no ataxia in BLE.No drift.  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 Scattered nonspecific periventricular subcortical hypodense lesions.    CTA head & neck no LVO or stenosis or aneurysm.  MRI  Small acute infarct in the posterior aspect of the left putamen.  2D Echo pending LDL 110 HgbA1c 5.9 VTE prophylaxis - lovenox 40 No antithrombotic prior to admission, now on aspirin  81 mg daily and clopidogrel 75 mg daily for 3 weeks and then aspirin alone. Therapy recommendations:  pending Disposition:  pending  Hx of Stroke/TIA Possible past strokes per 8/14 MRI: Multiple small foci of encephalomalacia with surrounding gliosis in the centrum semiovale and corona radiata as well as bilateral basal ganglia and right thalamus. While demyelinating disease is an important consideration in this age group, image appearance is suggestive of chronic infarcts, particularly in the setting of a small acute left putaminal infarct.   Hypertension (no formal past assessment) Home meds:  none Unstable, SBP 150-170 BP goal: normotensive (24hr past LKN)  Hyperlipidemia Home meds:  none, resumed in hospital LDL 110, goal < 70 Added atorvastatin 40 mg once daily  Continue statin at discharge  Prediabetes HgbA1c 5.9, goal < 7.0 Recommend close follow-up with PCP to prevent DM complications  Tobacco Abuse Patient smokes but did not specify amount      Ready to quit? No Nicotine replacement therapy offered and denied by patient, PRN nicotine 14 mg patch if changes mind  Substance Abuse Patient uses cocaine. Did not provide any more specifics.       Ready to quit? N/A  Dysphagia Patient has post-stroke dysphagia, SLP consulted    Diet   Diet regular Room service appropriate? Yes; Fluid consistency: Thin   Advance diet as tolerated  Other Stroke Risk Factors ETOH use, advised to drink no more than 1 drink(s) a day   Hospital day # 1  Meryl Dare, MD PGY-1 Psychiatry Resident 05/20/2023, 4:47 PM  STROKE MD NOTE :  I have personally obtained history,examined this patient, reviewed notes, independently viewed imaging studies, participated in medical decision making and plan of care.ROS completed by me personally and pertinent positives fully documented  I have made any additions or clarifications directly to the above note. Agree with note above.  She presented with  history of cocaine abuse and altered mental status and MRI scan shows small left internal capsule lacunar infarct as well as several other old lacunar infarcts of remote age.  Patient counseled to change her lifestyle and to stop doing cocaine, marijuana was smoking.  Recommend aspirin and Plavix for 3 weeks followed by aspirin alone and aggressive risk factor modification.  Continue ongoing stroke workup.  Therapy consults.  No family available at the bedside.  Greater than 50% time during this 50-minute visit were spent on counseling and coordination of care about her lacunar stroke and cocaine substance abuse discussion about stroke prevention and treatment and answering questions.  Delia Heady, MD Medical Director [facility] Pager: [phone] 05/20/2023 4:57 PM    To contact Stroke Continuity provider, please refer to [email or web address]. After hours, contact General Neurology

## 2023-05-20 NOTE — Progress Notes (Incomplete Revision)
 OT Cancellation Note  Patient Details Name: Alexandra Buckley MRN: [id number] DOB: 03/23/1971   Cancelled Treatment:    Reason Eval/Treat Not Completed: Patient at procedure or test/ unavailable. Technician coming to room to do heart test per RN.  Lindon Romp OT Acute Rehabilitation Services Office [phone]    Evette Georges 05/20/2023, 1:33 PM

## 2023-05-21 ENCOUNTER — Inpatient Hospital Stay (HOSPITAL_COMMUNITY): Payer: Self-pay

## 2023-05-21 ENCOUNTER — Other Ambulatory Visit (HOSPITAL_COMMUNITY): Payer: Self-pay

## 2023-05-21 DIAGNOSIS — R569 Unspecified convulsions: Secondary | ICD-10-CM

## 2023-05-21 DIAGNOSIS — I10 Essential (primary) hypertension: Secondary | ICD-10-CM

## 2023-05-21 LAB — BASIC METABOLIC PANEL
Anion gap: 8 (ref 5–15)
BUN: 16 mg/dL (ref 6–20)
CO2: 28 mmol/L (ref 22–32)
Calcium: 9.4 mg/dL (ref 8.9–10.3)
Chloride: 107 mmol/L (ref 98–111)
Creatinine, Ser: 0.71 mg/dL (ref 0.44–1.00)
GFR, Estimated: >60 mL/min (ref >60)
Glucose, Bld: 93 mg/dL (ref 70–99)
Potassium: 3.7 mmol/L (ref 3.5–5.1)
Sodium: 143 mmol/L (ref 135–145)

## 2023-05-21 LAB — CBC
HCT: 41.2 % (ref 36.0–46.0)
Hemoglobin: 12.9 g/dL (ref 12.0–15.0)
MCH: 27.9 pg (ref 26.0–34.0)
MCHC: 31.3 g/dL (ref 30.0–36.0)
MCV: 89 fL (ref 80.0–100.0)
Platelets: 273 10*3/uL (ref 150–400)
RBC: 4.63 MIL/uL (ref 3.87–5.11)
RDW: 14.8 % (ref 11.5–15.5)
WBC: 4.7 10*3/uL (ref 4.0–10.5)
nRBC: 0 % (ref 0.0–0.2)

## 2023-05-21 LAB — MAGNESIUM: Magnesium: 2 mg/dL (ref 1.7–2.4)

## 2023-05-21 LAB — CK: Total CK: 90 U/L (ref 38–234)

## 2023-05-21 MED ORDER — NICOTINE 14 MG/24HR TD PT24: 14.0000 mg | MEDICATED_PATCH | Freq: Every day | TRANSDERMAL | Status: DC | PRN

## 2023-05-21 MED ORDER — CLOPIDOGREL BISULFATE 75 MG PO TABS
75.0000 mg | ORAL_TABLET | Freq: Every day | ORAL | 0 refills | Status: DC
  Filled 2023-05-21: qty 21, 21d supply, fill #0

## 2023-05-21 MED ORDER — AMLODIPINE BESYLATE 10 MG PO TABS
10.0000 mg | ORAL_TABLET | Freq: Every day | ORAL | 0 refills | Status: DC
Start: 2023-05-22 — End: 2023-10-28
  Filled 2023-05-21: qty 30, 30d supply, fill #0

## 2023-05-21 MED ORDER — ACETAMINOPHEN 325 MG PO TABS: 650.0000 mg | ORAL_TABLET | Freq: Four times a day (QID) | ORAL | Status: DC | PRN

## 2023-05-21 MED ORDER — HYDRALAZINE HCL 25 MG PO TABS: 25.0000 mg | ORAL_TABLET | Freq: Four times a day (QID) | ORAL | Status: DC | PRN

## 2023-05-21 MED ORDER — ASPIRIN 81 MG PO TBEC: 81.0000 mg | DELAYED_RELEASE_TABLET | Freq: Every day | ORAL | Status: DC

## 2023-05-21 MED ORDER — ATORVASTATIN CALCIUM 40 MG PO TABS
40.0000 mg | ORAL_TABLET | Freq: Every day | ORAL | 1 refills | Status: DC
Start: 2023-05-21 — End: 2023-10-28
  Filled 2023-05-21 (×2): qty 30, 30d supply, fill #0

## 2023-05-21 MED ORDER — AMLODIPINE BESYLATE 10 MG PO TABS
10.0000 mg | ORAL_TABLET | Freq: Every day | ORAL | Status: DC
  Administered 2023-05-21: 10 mg via ORAL
  Filled 2023-05-21: qty 1

## 2023-05-21 NOTE — Progress Notes (Incomplete Revision)
 STROKE TEAM PROGRESS NOTE   BRIEF HPI Alexandra Buckley is a 52 y.o. female with a past medical history of drug use presenting after being found unresponsive outside. She endorses using cocaine and smoking daily. She states that she smoked cocaine at 3am and then went for a walk. She remembers feeling tired and then laying down on the ground. She then remembers coming around at the [facility] ED. She denies being treated for previous strokes, seizures, or other past medical history. She states that right now she just feels tired. She denies dizziness, nausea, or recent changes to her health.    SIGNIFICANT HOSPITAL EVENTS 8/14: presented to ED to hospital admission  INTERIM HISTORY/SUBJECTIVE Pt is more alert today. With regard to symptoms onset she reports that she fell and had a head injury and then only remembers waking up and EMS being there. Prior to the fell, she remembered feeling somnolent but does not endorse any weakness or balance issues. She does endorse smoking cigarette and we informed her that this is a practice that significantly increases her risk for another stroke and she denied nicotine  replacement therapy. We informed her that she need to manage her chronic conditions, and we wanted to do a couple more tests today including EEG and we could let her go.    OBJECTIVE  CBC    Component Value Date/Time   WBC 4.7 05/21/2023 0625   RBC 4.63 05/21/2023 0625   HGB 12.9 05/21/2023 0625   HCT 41.2 05/21/2023 0625   PLT 273 05/21/2023 0625   MCV 89.0 05/21/2023 0625   MCH 27.9 05/21/2023 0625   MCHC 31.3 05/21/2023 0625   RDW 14.8 05/21/2023 0625   LYMPHSABS 2.4 05/19/2023 0858   MONOABS 0.4 05/19/2023 0858   EOSABS 0.1 05/19/2023 0858   BASOSABS 0.0 05/19/2023 0858    BMET    Component Value Date/Time   NA 143 05/21/2023 0625   K 3.7 05/21/2023 0625   CL 107 05/21/2023 0625   CO2 28 05/21/2023 0625   GLUCOSE 93 05/21/2023 0625   BUN 16 05/21/2023 0625   CREATININE 0.71  05/21/2023 0625   CALCIUM  9.4 05/21/2023 0625   GFRNONAA >60 05/21/2023 0625    IMAGING past 24 hours ECHOCARDIOGRAM COMPLETE BUBBLE STUDY  Result Date: 05/20/2023    ECHOCARDIOGRAM REPORT   Patient Name:   Alexandra Buckley Date of Exam: 05/20/2023 Medical Rec #:  [id number]     Height:       62.0 in Accession #:    [id number]    Weight:       140.0 lb Date of Birth:  07/03/1971     BSA:          1.643 m Patient Age:    51 years      BP:           153/57 mmHg Patient Gender: F             HR:           67 bpm. Exam Location:  Inpatient Procedure: 2D Echo, Cardiac Doppler and Color Doppler Indications:    stroke  History:        Patient has no prior history of Echocardiogram examinations.                 Risk Factors:cocaine use.  Sonographer:    Tinnie Barefoot RDCS Referring Phys: [id number] DEVON SHAFER IMPRESSIONS  1. Left ventricular ejection fraction, by estimation, is 70 to 75%. The left  ventricle has hyperdynamic function. The left ventricle has no regional wall motion abnormalities. Left ventricular diastolic parameters are consistent with Grade I diastolic dysfunction (impaired relaxation).  2. Right ventricular systolic function is hyperdynamic. The right ventricular size is normal. There is normal pulmonary artery systolic pressure. The estimated right ventricular systolic pressure is 24.5 mmHg.  3. The saline contrast study was poor quality. Agitated saline contrast bubble study was negative, with no evidence of any interatrial shunt.  4. The mitral valve is normal in structure. No evidence of mitral valve regurgitation. No evidence of mitral stenosis.  5. The aortic valve is normal in structure. Aortic valve regurgitation is not visualized. No aortic stenosis is present.  6. The inferior vena cava is normal in size with greater than 50% respiratory variability, suggesting right atrial pressure of 3 mmHg. FINDINGS  Left Ventricle: Left ventricular ejection fraction, by estimation, is 70 to 75%. The  left ventricle has hyperdynamic function. The left ventricle has no regional wall motion abnormalities. The left ventricular internal cavity size was normal in size. There is no left ventricular hypertrophy. Left ventricular diastolic parameters are consistent with Grade I diastolic dysfunction (impaired relaxation). Normal left ventricular filling pressure. Right Ventricle: The right ventricular size is normal. No increase in right ventricular wall thickness. Right ventricular systolic function is hyperdynamic. There is normal pulmonary artery systolic pressure. The tricuspid regurgitant velocity is 2.32 m/s, and with an assumed right atrial pressure of 3 mmHg, the estimated right ventricular systolic pressure is 24.5 mmHg. Left Atrium: Left atrial size was normal in size. Right Atrium: Right atrial size was normal in size. Pericardium: There is no evidence of pericardial effusion. Mitral Valve: The mitral valve is normal in structure. No evidence of mitral valve regurgitation. No evidence of mitral valve stenosis. Tricuspid Valve: The tricuspid valve is normal in structure. Tricuspid valve regurgitation is trivial. No evidence of tricuspid stenosis. Aortic Valve: The aortic valve is normal in structure. Aortic valve regurgitation is not visualized. No aortic stenosis is present. Pulmonic Valve: The pulmonic valve was normal in structure. Pulmonic valve regurgitation is not visualized. No evidence of pulmonic stenosis. Aorta: The aortic root is normal in size and structure. Venous: The inferior vena cava is normal in size with greater than 50% respiratory variability, suggesting right atrial pressure of 3 mmHg. IAS/Shunts: No atrial level shunt detected by color flow Doppler. Agitated saline contrast was given intravenously to evaluate for intracardiac shunting. Agitated saline contrast bubble study was negative, with no evidence of any interatrial shunt.  LEFT VENTRICLE PLAX 2D LVIDd:         4.40 cm   Diastology  LVIDs:         2.50 cm   LV e' medial:    9.14 cm/s LV PW:         1.00 cm   LV E/e' medial:  8.0 LV IVS:        1.00 cm   LV e' lateral:   8.70 cm/s LVOT diam:     1.70 cm   LV E/e' lateral: 8.4 LV SV:         60 LV SV Index:   37 LVOT Area:     2.27 cm  RIGHT VENTRICLE             IVC RV Basal diam:  2.20 cm     IVC diam: 1.50 cm RV S prime:     12.20 cm/s TAPSE (M-mode): 1.7 cm LEFT ATRIUM  Index        RIGHT ATRIUM          Index LA diam:        3.30 cm 2.01 cm/m   RA Area:     9.71 cm LA Vol (A2C):   33.2 ml 20.21 ml/m  RA Volume:   20.10 ml 12.24 ml/m LA Vol (A4C):   35.1 ml 21.37 ml/m LA Biplane Vol: 34.2 ml 20.82 ml/m  AORTIC VALVE LVOT Vmax:   133.00 cm/s LVOT Vmean:  85.200 cm/s LVOT VTI:    0.266 m  AORTA Ao Root diam: 2.60 cm Ao Asc diam:  3.00 cm MITRAL VALVE               TRICUSPID VALVE MV Area (PHT): 3.65 cm    TR Peak grad:   21.5 mmHg MV Decel Time: 208 msec    TR Vmax:        232.00 cm/s MV E velocity: 73.10 cm/s MV A velocity: 99.40 cm/s  SHUNTS MV E/A ratio:  0.74        Systemic VTI:  0.27 m                            Systemic Diam: 1.70 cm Jerel Balding MD Electronically signed by Jerel Balding MD Signature Date/Time: 05/20/2023/6:12:59 PM    Final     Vitals:   05/21/23 0339 05/21/23 0736 05/21/23 1109 05/21/23 1159  BP: (!) 127/109 (!) 171/82 (!) 172/89 (!) 174/73  Pulse: 66 69 72 66  Resp: 18 16  17   Temp: 97.9 F (36.6 C) 98.7 F (37.1 C)  98.7 F (37.1 C)  TempSrc: Oral Oral  Oral  SpO2: 100% 99%  99%  Weight:      Height:         PHYSICAL EXAM General:  alert and cooperative Psych:  Mood and affect appropriate for situation CV: Regular rate and rhythm on monitor Respiratory:  Regular, unlabored respirations on room air GI: Abdomen soft and nontender   NEURO:  Mental Status: AA&Ox3, patient is able to give clear and coherent history Speech/Language: speech is without dysarthria or aphasia.  Naming, repetition, fluency, and comprehension  intact.  Cranial Nerves:  II: PERRL. Visual fields full.  III, IV, VI: EOMI. Eyelids elevate symmetrically.  V: Sensation is intact to light touch and symmetrical to face.  VII: Face is symmetrical resting and smiling VIII: hearing intact to voice. IX, X: Palate elevates symmetrically. Phonation is normal.  KP:Dynloizm shrug 5/5. XII: tongue is midline without fasciculations. Motor: 5/5 strength to all muscle groups tested.  Tone: is normal and bulk is normal Sensation- Intact to light touch bilaterally. Extinction absent to light touch to DSS.  Only finding is R 1st toe numbness.  Coordination: FTN intact bilaterally, HKS: no ataxia in BLE.No drift.  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 Scattered nonspecific periventricular subcortical hypodense lesions.    CTA head & neck no LVO or stenosis or aneurysm.  MRI  Small acute infarct in the posterior aspect of the left putamen.  2D Echo 70-75%. Negative bubble for PFO. No intraventricular  LDL 110 HgbA1c 5.9 UDS + cocaine Hypercoagulable panel (ANA, cardiolipin, homocysteine, beta-2 -glycoprotein, lupus antibody) in process EEG negative  VTE prophylaxis - lovenox  40 No antithrombotic prior to admission, now on aspirin   81 mg daily and clopidogrel  75 mg daily for 3 weeks and then aspirin  alone. Therapy recommendations:  none Disposition:  pending  Hx of Stroke/TIA Possible past strokes per 8/14 MRI: Multiple small foci of encephalomalacia with surrounding gliosis in the centrum semiovale and corona radiata as well as bilateral basal ganglia and right thalamus. Image appearance is suggestive of chronic infarcts, particularly in the setting of a small acute left putaminal infarct.   Hypertension Home meds:  none Unstable, SBP 150-170 Add norvasc  10 BP goal: normotensive   Hyperlipidemia Home meds:   none, resumed in hospital LDL 110, goal < 70 Added atorvastatin  40 mg once daily  Continue statin at discharge  Tobacco Abuse Patient smokes but did not specify amount Nicotine  replacement therapy offered and denied by patient PRN nicotine  14 mg patch if needed  Substance Abuse Patient uses cocaine. Did not provide any more specifics.  UDS positive for cocaine Cessation education provided and she is willing to quit  Other Stroke Risk Factors ETOH use, advised to drink no more than 1 drink(s) a day   Hospital day # 2  Justino Cornish, MD PGY-1 Psychiatry Resident   ATTENDING NOTE: I reviewed above note and agree with the assessment and plan. Pt was seen and examined.   EEG tech at the bedside. Pt is eating snack. Neuro intact except right big toe numbness. MRI showed small left BG infarct, CTA head and neck, EEG negative. BP on the high end, added norvasc  10. Continue statin lipitor 40. On DAPT for 3 weeks and then ASA alone. PT and OT no recs. Cocaine cessation education provided.   For detailed assessment and plan, please refer to above/below as I have made changes wherever appropriate.   Neurology will sign off. Please call with questions. Pt will follow up with stroke clinic NP at [facility] in about 4 weeks. Thanks for the consult.   Ary Cummins, MD PhD Stroke Neurology 05/21/2023 2:54 PM      To contact Stroke Continuity provider, please refer to [email or web address]. After hours, contact General Neurology

## 2023-05-21 NOTE — Evaluation (Addendum)
 Speech Language Pathology Evaluation Patient Details Name: Alexandra Buckley MRN: [id number] DOB: Dec 24, 1970 Today's Date: 05/21/2023 Time: 4098-1191 SLP Time Calculation (min) (ACUTE ONLY): 17 min  Problem List:  Patient Active Problem List   Diagnosis Date Noted   Cocaine use 05/20/2023   Tobacco use disorder 05/20/2023   Acute CVA (cerebrovascular accident) (HCC) 05/19/2023   Past Medical History: No past medical history on file.  HPI:  Alexandra Buckley is a 52 y.o. female who denies any past medical history being admitted to the hospital with acute stroke. MRI: Small acute infarct in the posterior aspect of the left putamen, Multiple small foci of encephalomalacia with surrounding gliosis in the centrum semiovale and corona radiata as well as bilateral basal ganglia and right thalamus.She admits to snorting cocaine PTA, lasting remembers walking down the street and falling.  Seems like this was not witnessed, bystander called 911 because patient was found sleeping on the ground next to the road.   Assessment / Plan / Recommendation Clinical Impression  Pt seen for cognitive-linguistic evaluation. Assessment completed via informal means and portions of Cognistat. Pt demonstrated intact basic functional cognitive-linguistic ability. Reduced insight into current diagnosis appreciated. Pt stating, "I wonder if me being tired and hitting my head caused the stroke." Pt's speech is fluent and appropriate. Very minimal and occasional articulatory imprecision noted with no impact on speech intelligibility. Pt politely declined further SLP services.    SLP Assessment  SLP Recommendation/Assessment: Patient does not need any further Speech Lanaguage Pathology Services    Recommendations for follow up therapy are one component of a multi-disciplinary discharge planning process, led by the attending physician.  Recommendations may be updated based on patient status, additional functional criteria and  insurance authorization.    Follow Up Recommendations  No SLP follow up    Assistance Recommended at Discharge   (defer to OT/PT)  Functional Status Assessment Patient has not had a recent decline in their functional status        SLP Evaluation Cognition  Overall Cognitive Status: Within Functional Limits for tasks assessed Orientation Level: Oriented X4 Day of Week: Correct Memory: Appears intact Awareness: Appears intact Problem Solving: Appears intact Executive Function: Reasoning;Sequencing Reasoning: Appears intact Sequencing: Appears intact Self Monitoring: Appears intact Safety/Judgment: Appears intact       Comprehension  Auditory Comprehension Overall Auditory Comprehension: Appears within functional limits for tasks assessed Yes/No Questions: Within Functional Limits Commands: Within Functional Limits Conversation: Simple Interfering Components: Attention    Expression Expression Primary Mode of Expression: Verbal Verbal Expression Overall Verbal Expression: Appears within functional limits for tasks assessed Initiation: No impairment Automatic Speech: Name;Social Response Level of Generative/Spontaneous Verbalization: Conversation Naming: No impairment Pragmatics: No impairment   Oral / Motor  Oral Motor/Sensory Function Overall Oral Motor/Sensory Function: Mild impairment Facial Symmetry: Abnormal symmetry left (vs natural asymmetry) Motor Speech Overall Motor Speech: Appears within functional limits for tasks assessed Respiration: Within functional limits Phonation: Normal Resonance: Within functional limits Articulation: Within functional limitis (very minimal and inconsistent articulatory imprecision) Intelligibility: Intelligible Motor Planning: Witnin functional limits           Clyde Canterbury, M.S., CCC-SLP Speech-Language Pathologist Secure Chat Preferred  O: [phone]  Woodroe Chen 05/21/2023, 10:58 AM

## 2023-05-21 NOTE — Procedures (Addendum)
 Patient Name: Alexandra Buckley  MRN: [id number]  Epilepsy Attending: Charlsie Quest  Referring Physician/Provider: Marvel Plan, MD  Date: 05/21/2023 Duration: 22.07 mins  Patient history: 52yo F presented with history of cocaine abuse and altered mental status getting eeg to evaluate for seizure.  Level of alertness: Awake, asleep  AEDs during EEG study: None  Technical aspects: This EEG study was done with scalp electrodes positioned according to the 10-20 International system of electrode placement. Electrical activity was reviewed with band pass filter of 1-70Hz , sensitivity of 7 uV/mm, display speed of 46mm/sec with a 60Hz  notched filter applied as appropriate. EEG data were recorded continuously and digitally stored.  Video monitoring was available and reviewed as appropriate.  Description: The posterior dominant rhythm consists of 10 Hz activity of moderate voltage (25-35 uV) seen predominantly in posterior head regions, symmetric and reactive to eye opening and eye closing. Sleep was characterized by vertex waves, sleep spindles (12 to 14 Hz), maximal frontocentral region. Hyperventilation and photic stimulation were not performed.     IMPRESSION: This study is within normal limits. No seizures or epileptiform discharges were seen throughout the recording.  A normal interictal EEG does not exclude the diagnosis of epilepsy.  Alexandra Buckley

## 2023-05-21 NOTE — Progress Notes (Incomplete Revision)
 Occupational Therapy Treatment and Discharge Patient Details Name: Alexandra Buckley MRN: [id number] DOB: 08/15/1971 Today's Date: 05/21/2023   History of present illness Alexandra Buckley is a 52 y.o. female admitted 8/14. 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   OT comments  This 52 yo female seen today and is a Mod I with SPC for all basic ADLs in her hospital room environment. No further OT needs, we will sign off.      If plan is discharge home, recommend the following:   (no A needed)   Equipment Recommendations  None recommended by OT       Precautions / Restrictions Precautions Precautions: Fall Restrictions Weight Bearing Restrictions: No       Mobility Bed Mobility Overal bed mobility: Independent                  Transfers Overall transfer level: Modified independent Equipment used: Straight cane Transfers: Sit to/from Stand Sit to Stand: Modified independent (Device/Increase time)           General transfer comment: Slow to rise     Balance Overall balance assessment: Modified Independent                                         ADL either performed or assessed with clinical judgement   ADL Overall ADL's : Independent;Modified independent                                            Extremity/Trunk Assessment Upper Extremity Assessment Upper Extremity Assessment: Overall WFL for tasks assessed            Vision Patient Visual Report: No change from baseline            Cognition Arousal: Alert Behavior During Therapy: WFL for tasks assessed/performed Overall Cognitive Status: Within Functional Limits for tasks  assessed                                                     Pertinent Vitals/ Pain       Pain Assessment Pain Assessment: No/denies pain         Frequency  Min 1X/week        Progress Toward Goals  OT Goals(current goals can now be found in the care plan section)  Progress towards OT goals: Goals met/education completed, patient discharged from OT  Acute Rehab OT Goals Patient Stated Goal: did not state (did want some shoes, social worker looked but they did not have any shoes her size)         AM-PAC OT "6 Clicks" Daily Activity     Outcome Measure   Help from another person eating meals?: None Help from another person taking care of personal grooming?: None Help from another person toileting, which includes using toliet, bedpan, or urinal?: None Help from another person bathing (including washing, rinsing,  drying)?: None Help from another person to put on and taking off regular upper body clothing?: None   6 Click Score: 20    End of Session Equipment Utilized During Treatment:  (SPC)  OT Visit Diagnosis: Unsteadiness on feet (R26.81);Other abnormalities of gait and mobility (R26.89)   Activity Tolerance Patient tolerated treatment well   Patient Left in bed;with call bell/phone within reach;with bed alarm set   Nurse Communication Mobility status        Time: 8295-6213 OT Time Calculation (min): 8 min  Charges: OT General Charges $OT Visit: 1 Visit OT Treatments $Self Care/Home Management : 8-22 mins  Lindon Romp OT Acute Rehabilitation Services Office [phone]    Evette Georges 05/21/2023, 5:41 PM

## 2023-05-21 NOTE — TOC Transition Note (Incomplete Revision)
   Transition of Care [facility]) - CM/SW Discharge Note   Patient Details  Name: Alexandra Buckley MRN: [id number] Date of Birth: 12-May-1971  Transition of Care [facility]) CM/SW Contact:  Kermit Balo, RN Phone Number: 05/21/2023, 3:29 PM   Clinical Narrative:    Pt will receive cane from [employer].  Home health ordered but pt is homeless and unsure of where she is going to stay.  Cab voucher provided to bedside RN for transportation from hospital to where she wants to go in [place].   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) Barriers to Discharge: No Barriers Identified   Patient Goals and CMS Choice   Choice offered to / list presented to : Patient  Discharge Placement                         Discharge Plan and Services Additional resources added to the After Visit Summary for                  DME Arranged: Gilmer Mor DME Agency: [employer] Date DME Agency Contacted: 05/21/23   Representative spoke with at DME Agency: [email or web address]            Social Determinants of Health (SDOH) Interventions SDOH Screenings   Food Insecurity: Food Insecurity Present (05/19/2023)  Housing: High Risk (05/19/2023)  Transportation Needs: Unmet Transportation Needs (05/19/2023)  Utilities: At Risk (05/19/2023)     Readmission Risk Interventions     No data to display

## 2023-05-21 NOTE — TOC Transition Note (Addendum)
   Transition of Care [facility]) - CM/SW Discharge Note   Patient Details  Name: Alexandra Buckley MRN: [id number] Date of Birth: 12-May-1971  Transition of Care [facility]) CM/SW Contact:  Kermit Balo, RN Phone Number: 05/21/2023, 3:29 PM   Clinical Narrative:    Pt will receive cane from [employer].  Home health ordered but pt is homeless and unsure of where she is going to stay.  Cab voucher provided to bedside RN for transportation from hospital to where she wants to go in [place].   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) Barriers to Discharge: No Barriers Identified   Patient Goals and CMS Choice   Choice offered to / list presented to : Patient  Discharge Placement                         Discharge Plan and Services Additional resources added to the After Visit Summary for                  DME Arranged: Gilmer Mor DME Agency: [employer] Date DME Agency Contacted: 05/21/23   Representative spoke with at DME Agency: [email or web address]            Social Determinants of Health (SDOH) Interventions SDOH Screenings   Food Insecurity: Food Insecurity Present (05/19/2023)  Housing: High Risk (05/19/2023)  Transportation Needs: Unmet Transportation Needs (05/19/2023)  Utilities: At Risk (05/19/2023)     Readmission Risk Interventions     No data to display

## 2023-05-21 NOTE — Progress Notes (Addendum)
 STROKE TEAM PROGRESS NOTE   BRIEF HPI Alexandra Buckley is a 52 y.o. female with a past medical history of drug use presenting after being found unresponsive outside. She endorses using cocaine and smoking daily. She states that she smoked cocaine at 3am and then went for a walk. She remembers feeling tired and then laying down on the ground. She then remembers coming around at the [facility] ED. She denies being treated for previous strokes, seizures, or other past medical history. She states that right now she just feels tired. She denies dizziness, nausea, or recent changes to her health.    SIGNIFICANT HOSPITAL EVENTS 8/14: presented to ED to hospital admission  INTERIM HISTORY/SUBJECTIVE Pt is more alert today. With regard to symptoms onset she reports that she fell and had a head injury and then only remembers waking up and EMS being there. Prior to the fell, she remembered feeling somnolent but does not endorse any weakness or balance issues. She does endorse smoking cigarette and we informed her that this is a practice that significantly increases her risk for another stroke and she denied nicotine  replacement therapy. We informed her that she need to manage her chronic conditions, and we wanted to do a couple more tests today including EEG and we could let her go.    OBJECTIVE  CBC    Component Value Date/Time   WBC 4.7 05/21/2023 0625   RBC 4.63 05/21/2023 0625   HGB 12.9 05/21/2023 0625   HCT 41.2 05/21/2023 0625   PLT 273 05/21/2023 0625   MCV 89.0 05/21/2023 0625   MCH 27.9 05/21/2023 0625   MCHC 31.3 05/21/2023 0625   RDW 14.8 05/21/2023 0625   LYMPHSABS 2.4 05/19/2023 0858   MONOABS 0.4 05/19/2023 0858   EOSABS 0.1 05/19/2023 0858   BASOSABS 0.0 05/19/2023 0858    BMET    Component Value Date/Time   NA 143 05/21/2023 0625   K 3.7 05/21/2023 0625   CL 107 05/21/2023 0625   CO2 28 05/21/2023 0625   GLUCOSE 93 05/21/2023 0625   BUN 16 05/21/2023 0625   CREATININE 0.71  05/21/2023 0625   CALCIUM  9.4 05/21/2023 0625   GFRNONAA >60 05/21/2023 0625    IMAGING past 24 hours ECHOCARDIOGRAM COMPLETE BUBBLE STUDY  Result Date: 05/20/2023    ECHOCARDIOGRAM REPORT   Patient Name:   Alexandra Buckley Date of Exam: 05/20/2023 Medical Rec #:  [id number]     Height:       62.0 in Accession #:    [id number]    Weight:       140.0 lb Date of Birth:  07/03/1971     BSA:          1.643 m Patient Age:    51 years      BP:           153/57 mmHg Patient Gender: F             HR:           67 bpm. Exam Location:  Inpatient Procedure: 2D Echo, Cardiac Doppler and Color Doppler Indications:    stroke  History:        Patient has no prior history of Echocardiogram examinations.                 Risk Factors:cocaine use.  Sonographer:    Tinnie Barefoot RDCS Referring Phys: [id number] DEVON SHAFER IMPRESSIONS  1. Left ventricular ejection fraction, by estimation, is 70 to 75%. The left  ventricle has hyperdynamic function. The left ventricle has no regional wall motion abnormalities. Left ventricular diastolic parameters are consistent with Grade I diastolic dysfunction (impaired relaxation).  2. Right ventricular systolic function is hyperdynamic. The right ventricular size is normal. There is normal pulmonary artery systolic pressure. The estimated right ventricular systolic pressure is 24.5 mmHg.  3. The saline contrast study was poor quality. Agitated saline contrast bubble study was negative, with no evidence of any interatrial shunt.  4. The mitral valve is normal in structure. No evidence of mitral valve regurgitation. No evidence of mitral stenosis.  5. The aortic valve is normal in structure. Aortic valve regurgitation is not visualized. No aortic stenosis is present.  6. The inferior vena cava is normal in size with greater than 50% respiratory variability, suggesting right atrial pressure of 3 mmHg. FINDINGS  Left Ventricle: Left ventricular ejection fraction, by estimation, is 70 to 75%. The  left ventricle has hyperdynamic function. The left ventricle has no regional wall motion abnormalities. The left ventricular internal cavity size was normal in size. There is no left ventricular hypertrophy. Left ventricular diastolic parameters are consistent with Grade I diastolic dysfunction (impaired relaxation). Normal left ventricular filling pressure. Right Ventricle: The right ventricular size is normal. No increase in right ventricular wall thickness. Right ventricular systolic function is hyperdynamic. There is normal pulmonary artery systolic pressure. The tricuspid regurgitant velocity is 2.32 m/s, and with an assumed right atrial pressure of 3 mmHg, the estimated right ventricular systolic pressure is 24.5 mmHg. Left Atrium: Left atrial size was normal in size. Right Atrium: Right atrial size was normal in size. Pericardium: There is no evidence of pericardial effusion. Mitral Valve: The mitral valve is normal in structure. No evidence of mitral valve regurgitation. No evidence of mitral valve stenosis. Tricuspid Valve: The tricuspid valve is normal in structure. Tricuspid valve regurgitation is trivial. No evidence of tricuspid stenosis. Aortic Valve: The aortic valve is normal in structure. Aortic valve regurgitation is not visualized. No aortic stenosis is present. Pulmonic Valve: The pulmonic valve was normal in structure. Pulmonic valve regurgitation is not visualized. No evidence of pulmonic stenosis. Aorta: The aortic root is normal in size and structure. Venous: The inferior vena cava is normal in size with greater than 50% respiratory variability, suggesting right atrial pressure of 3 mmHg. IAS/Shunts: No atrial level shunt detected by color flow Doppler. Agitated saline contrast was given intravenously to evaluate for intracardiac shunting. Agitated saline contrast bubble study was negative, with no evidence of any interatrial shunt.  LEFT VENTRICLE PLAX 2D LVIDd:         4.40 cm   Diastology  LVIDs:         2.50 cm   LV e' medial:    9.14 cm/s LV PW:         1.00 cm   LV E/e' medial:  8.0 LV IVS:        1.00 cm   LV e' lateral:   8.70 cm/s LVOT diam:     1.70 cm   LV E/e' lateral: 8.4 LV SV:         60 LV SV Index:   37 LVOT Area:     2.27 cm  RIGHT VENTRICLE             IVC RV Basal diam:  2.20 cm     IVC diam: 1.50 cm RV S prime:     12.20 cm/s TAPSE (M-mode): 1.7 cm LEFT ATRIUM  Index        RIGHT ATRIUM          Index LA diam:        3.30 cm 2.01 cm/m   RA Area:     9.71 cm LA Vol (A2C):   33.2 ml 20.21 ml/m  RA Volume:   20.10 ml 12.24 ml/m LA Vol (A4C):   35.1 ml 21.37 ml/m LA Biplane Vol: 34.2 ml 20.82 ml/m  AORTIC VALVE LVOT Vmax:   133.00 cm/s LVOT Vmean:  85.200 cm/s LVOT VTI:    0.266 m  AORTA Ao Root diam: 2.60 cm Ao Asc diam:  3.00 cm MITRAL VALVE               TRICUSPID VALVE MV Area (PHT): 3.65 cm    TR Peak grad:   21.5 mmHg MV Decel Time: 208 msec    TR Vmax:        232.00 cm/s MV E velocity: 73.10 cm/s MV A velocity: 99.40 cm/s  SHUNTS MV E/A ratio:  0.74        Systemic VTI:  0.27 m                            Systemic Diam: 1.70 cm Jerel Balding MD Electronically signed by Jerel Balding MD Signature Date/Time: 05/20/2023/6:12:59 PM    Final     Vitals:   05/21/23 0339 05/21/23 0736 05/21/23 1109 05/21/23 1159  BP: (!) 127/109 (!) 171/82 (!) 172/89 (!) 174/73  Pulse: 66 69 72 66  Resp: 18 16  17   Temp: 97.9 F (36.6 C) 98.7 F (37.1 C)  98.7 F (37.1 C)  TempSrc: Oral Oral  Oral  SpO2: 100% 99%  99%  Weight:      Height:         PHYSICAL EXAM General:  alert and cooperative Psych:  Mood and affect appropriate for situation CV: Regular rate and rhythm on monitor Respiratory:  Regular, unlabored respirations on room air GI: Abdomen soft and nontender   NEURO:  Mental Status: AA&Ox3, patient is able to give clear and coherent history Speech/Language: speech is without dysarthria or aphasia.  Naming, repetition, fluency, and comprehension  intact.  Cranial Nerves:  II: PERRL. Visual fields full.  III, IV, VI: EOMI. Eyelids elevate symmetrically.  V: Sensation is intact to light touch and symmetrical to face.  VII: Face is symmetrical resting and smiling VIII: hearing intact to voice. IX, X: Palate elevates symmetrically. Phonation is normal.  KP:Dynloizm shrug 5/5. XII: tongue is midline without fasciculations. Motor: 5/5 strength to all muscle groups tested.  Tone: is normal and bulk is normal Sensation- Intact to light touch bilaterally. Extinction absent to light touch to DSS.  Only finding is R 1st toe numbness.  Coordination: FTN intact bilaterally, HKS: no ataxia in BLE.No drift.  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 Scattered nonspecific periventricular subcortical hypodense lesions.    CTA head & neck no LVO or stenosis or aneurysm.  MRI  Small acute infarct in the posterior aspect of the left putamen.  2D Echo 70-75%. Negative bubble for PFO. No intraventricular  LDL 110 HgbA1c 5.9 UDS + cocaine Hypercoagulable panel (ANA, cardiolipin, homocysteine, beta-2 -glycoprotein, lupus antibody) in process EEG negative  VTE prophylaxis - lovenox  40 No antithrombotic prior to admission, now on aspirin   81 mg daily and clopidogrel  75 mg daily for 3 weeks and then aspirin  alone. Therapy recommendations:  none Disposition:  pending  Hx of Stroke/TIA Possible past strokes per 8/14 MRI: Multiple small foci of encephalomalacia with surrounding gliosis in the centrum semiovale and corona radiata as well as bilateral basal ganglia and right thalamus. Image appearance is suggestive of chronic infarcts, particularly in the setting of a small acute left putaminal infarct.   Hypertension Home meds:  none Unstable, SBP 150-170 Add norvasc  10 BP goal: normotensive   Hyperlipidemia Home meds:   none, resumed in hospital LDL 110, goal < 70 Added atorvastatin  40 mg once daily  Continue statin at discharge  Tobacco Abuse Patient smokes but did not specify amount Nicotine  replacement therapy offered and denied by patient PRN nicotine  14 mg patch if needed  Substance Abuse Patient uses cocaine. Did not provide any more specifics.  UDS positive for cocaine Cessation education provided and she is willing to quit  Other Stroke Risk Factors ETOH use, advised to drink no more than 1 drink(s) a day   Hospital day # 2  Justino Cornish, MD PGY-1 Psychiatry Resident   ATTENDING NOTE: I reviewed above note and agree with the assessment and plan. Pt was seen and examined.   EEG tech at the bedside. Pt is eating snack. Neuro intact except right big toe numbness. MRI showed small left BG infarct, CTA head and neck, EEG negative. BP on the high end, added norvasc  10. Continue statin lipitor 40. On DAPT for 3 weeks and then ASA alone. PT and OT no recs. Cocaine cessation education provided.   For detailed assessment and plan, please refer to above/below as I have made changes wherever appropriate.   Neurology will sign off. Please call with questions. Pt will follow up with stroke clinic NP at [facility] in about 4 weeks. Thanks for the consult.   Ary Cummins, MD PhD Stroke Neurology 05/21/2023 2:54 PM      To contact Stroke Continuity provider, please refer to [email or web address]. After hours, contact General Neurology

## 2023-05-21 NOTE — Progress Notes (Incomplete Revision)
 STROKE TEAM PROGRESS NOTE   BRIEF HPI Alexandra Buckley is a 52 y.o. female with a past medical history of drug use presenting after being found unresponsive outside. She endorses using cocaine and smoking daily. She states that she smoked cocaine at 3am and then went for a walk. She remembers feeling tired and then laying down on the ground. She then remembers coming around at the [facility] ED. She denies being treated for previous strokes, seizures, or other past medical history. She states that right now she just feels tired. She denies dizziness, nausea, or recent changes to her health.    SIGNIFICANT HOSPITAL EVENTS 8/14: presented to ED to hospital admission  INTERIM HISTORY/SUBJECTIVE Pt is more alert today. With regard to symptoms onset she reports that she fell and had a head injury and then only remembers waking up and EMS being there. Prior to the fell, she remembered feeling somnolent but does not endorse any weakness or balance issues. She does endorse smoking cigarette and we informed her that this is a practice that significantly increases her risk for another stroke and she denied nicotine replacement therapy. We informed her that she need to manage her chronic conditions, and we wanted to do a couple more tests today including EEG and we could let her go.    OBJECTIVE  CBC    Component Value Date/Time   WBC 4.7 05/21/2023 0625   RBC 4.63 05/21/2023 0625   HGB 12.9 05/21/2023 0625   HCT 41.2 05/21/2023 0625   PLT 273 05/21/2023 0625   MCV 89.0 05/21/2023 0625   MCH 27.9 05/21/2023 0625   MCHC 31.3 05/21/2023 0625   RDW 14.8 05/21/2023 0625   LYMPHSABS 2.4 05/19/2023 0858   MONOABS 0.4 05/19/2023 0858   EOSABS 0.1 05/19/2023 0858   BASOSABS 0.0 05/19/2023 0858    BMET    Component Value Date/Time   NA 143 05/21/2023 0625   K 3.7 05/21/2023 0625   CL 107 05/21/2023 0625   CO2 28 05/21/2023 0625   GLUCOSE 93 05/21/2023 0625   BUN 16 05/21/2023 0625   CREATININE 0.71  05/21/2023 0625   CALCIUM 9.4 05/21/2023 0625   GFRNONAA >60 05/21/2023 0625    IMAGING past 24 hours ECHOCARDIOGRAM COMPLETE BUBBLE STUDY  Result Date: 05/20/2023    ECHOCARDIOGRAM REPORT   Patient Name:   Alexandra Buckley Date of Exam: 05/20/2023 Medical Rec #:  [id number]     Height:       62.0 in Accession #:    [id number]    Weight:       140.0 lb Date of Birth:  Aug 25, 1971     BSA:          1.643 m Patient Age:    51 years      BP:           153/57 mmHg Patient Gender: F             HR:           67 bpm. Exam Location:  Inpatient Procedure: 2D Echo, Cardiac Doppler and Color Doppler Indications:    stroke  History:        Patient has no prior history of Echocardiogram examinations.                 Risk Factors:cocaine use.  Sonographer:    Delcie Roch RDCS Referring Phys: [id number] DEVON SHAFER IMPRESSIONS  1. Left ventricular ejection fraction, by estimation, is 70 to 75%. The left  ventricle has hyperdynamic function. The left ventricle has no regional wall motion abnormalities. Left ventricular diastolic parameters are consistent with Grade I diastolic dysfunction (impaired relaxation).  2. Right ventricular systolic function is hyperdynamic. The right ventricular size is normal. There is normal pulmonary artery systolic pressure. The estimated right ventricular systolic pressure is 24.5 mmHg.  3. The saline contrast study was poor quality. Agitated saline contrast bubble study was negative, with no evidence of any interatrial shunt.  4. The mitral valve is normal in structure. No evidence of mitral valve regurgitation. No evidence of mitral stenosis.  5. The aortic valve is normal in structure. Aortic valve regurgitation is not visualized. No aortic stenosis is present.  6. The inferior vena cava is normal in size with greater than 50% respiratory variability, suggesting right atrial pressure of 3 mmHg. FINDINGS  Left Ventricle: Left ventricular ejection fraction, by estimation, is 70 to 75%. The  left ventricle has hyperdynamic function. The left ventricle has no regional wall motion abnormalities. The left ventricular internal cavity size was normal in size. There is no left ventricular hypertrophy. Left ventricular diastolic parameters are consistent with Grade I diastolic dysfunction (impaired relaxation). Normal left ventricular filling pressure. Right Ventricle: The right ventricular size is normal. No increase in right ventricular wall thickness. Right ventricular systolic function is hyperdynamic. There is normal pulmonary artery systolic pressure. The tricuspid regurgitant velocity is 2.32 m/s, and with an assumed right atrial pressure of 3 mmHg, the estimated right ventricular systolic pressure is 24.5 mmHg. Left Atrium: Left atrial size was normal in size. Right Atrium: Right atrial size was normal in size. Pericardium: There is no evidence of pericardial effusion. Mitral Valve: The mitral valve is normal in structure. No evidence of mitral valve regurgitation. No evidence of mitral valve stenosis. Tricuspid Valve: The tricuspid valve is normal in structure. Tricuspid valve regurgitation is trivial. No evidence of tricuspid stenosis. Aortic Valve: The aortic valve is normal in structure. Aortic valve regurgitation is not visualized. No aortic stenosis is present. Pulmonic Valve: The pulmonic valve was normal in structure. Pulmonic valve regurgitation is not visualized. No evidence of pulmonic stenosis. Aorta: The aortic root is normal in size and structure. Venous: The inferior vena cava is normal in size with greater than 50% respiratory variability, suggesting right atrial pressure of 3 mmHg. IAS/Shunts: No atrial level shunt detected by color flow Doppler. Agitated saline contrast was given intravenously to evaluate for intracardiac shunting. Agitated saline contrast bubble study was negative, with no evidence of any interatrial shunt.  LEFT VENTRICLE PLAX 2D LVIDd:         4.40 cm   Diastology  LVIDs:         2.50 cm   LV e' medial:    9.14 cm/s LV PW:         1.00 cm   LV E/e' medial:  8.0 LV IVS:        1.00 cm   LV e' lateral:   8.70 cm/s LVOT diam:     1.70 cm   LV E/e' lateral: 8.4 LV SV:         60 LV SV Index:   37 LVOT Area:     2.27 cm  RIGHT VENTRICLE             IVC RV Basal diam:  2.20 cm     IVC diam: 1.50 cm RV S prime:     12.20 cm/s TAPSE (M-mode): 1.7 cm LEFT ATRIUM  Index        RIGHT ATRIUM          Index LA diam:        3.30 cm 2.01 cm/m   RA Area:     9.71 cm LA Vol (A2C):   33.2 ml 20.21 ml/m  RA Volume:   20.10 ml 12.24 ml/m LA Vol (A4C):   35.1 ml 21.37 ml/m LA Biplane Vol: 34.2 ml 20.82 ml/m  AORTIC VALVE LVOT Vmax:   133.00 cm/s LVOT Vmean:  85.200 cm/s LVOT VTI:    0.266 m  AORTA Ao Root diam: 2.60 cm Ao Asc diam:  3.00 cm MITRAL VALVE               TRICUSPID VALVE MV Area (PHT): 3.65 cm    TR Peak grad:   21.5 mmHg MV Decel Time: 208 msec    TR Vmax:        232.00 cm/s MV E velocity: 73.10 cm/s MV A velocity: 99.40 cm/s  SHUNTS MV E/A ratio:  0.74        Systemic VTI:  0.27 m                            Systemic Diam: 1.70 cm Thurmon Fair MD Electronically signed by Thurmon Fair MD Signature Date/Time: 05/20/2023/6:12:59 PM    Final     Vitals:   05/21/23 0339 05/21/23 0736 05/21/23 1109 05/21/23 1159  BP: (!) 127/109 (!) 171/82 (!) 172/89 (!) 174/73  Pulse: 66 69 72 66  Resp: 18 16  17   Temp: 97.9 F (36.6 C) 98.7 F (37.1 C)  98.7 F (37.1 C)  TempSrc: Oral Oral  Oral  SpO2: 100% 99%  99%  Weight:      Height:         PHYSICAL EXAM General:  alert and cooperative Psych:  Mood and affect appropriate for situation CV: Regular rate and rhythm on monitor Respiratory:  Regular, unlabored respirations on room air GI: Abdomen soft and nontender   NEURO:  Mental Status: AA&Ox3, patient is able to give clear and coherent history Speech/Language: speech is without dysarthria or aphasia.  Naming, repetition, fluency, and comprehension  intact.  Cranial Nerves:  II: PERRL. Visual fields full.  III, IV, VI: EOMI. Eyelids elevate symmetrically.  V: Sensation is intact to light touch and symmetrical to face.  VII: Face is symmetrical resting and smiling VIII: hearing intact to voice. IX, X: Palate elevates symmetrically. Phonation is normal.  VW:[id number] shrug 5/5. XII: tongue is midline without fasciculations. Motor: 5/5 strength to all muscle groups tested.  Tone: is normal and bulk is normal Sensation- Intact to light touch bilaterally. Extinction absent to light touch to DSS.  Only finding is R 1st toe numbness.  Coordination: FTN intact bilaterally, HKS: no ataxia in BLE.No drift.  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 Scattered nonspecific periventricular subcortical hypodense lesions.    CTA head & neck no LVO or stenosis or aneurysm.  MRI  Small acute infarct in the posterior aspect of the left putamen.  2D Echo 70-75%. Negative bubble for PFO. No intraventricular  LDL 110 HgbA1c 5.9 UDS + cocaine Hypercoagulable panel (ANA, cardiolipin, homocysteine, beta-2-glycoprotein, lupus antibody) in process EEG negative  VTE prophylaxis - lovenox 40 No antithrombotic prior to admission, now on aspirin  81 mg daily and clopidogrel 75 mg daily for 3 weeks and then aspirin alone. Therapy recommendations:  none Disposition:  pending  Hx of Stroke/TIA Possible past strokes per 8/14 MRI: Multiple small foci of encephalomalacia with surrounding gliosis in the centrum semiovale and corona radiata as well as bilateral basal ganglia and right thalamus. Image appearance is suggestive of chronic infarcts, particularly in the setting of a small acute left putaminal infarct.   Hypertension Home meds:  none Unstable, SBP 150-170 Add norvasc 10 BP goal: normotensive   Hyperlipidemia Home meds:   none, resumed in hospital LDL 110, goal < 70 Added atorvastatin 40 mg once daily  Continue statin at discharge  Tobacco Abuse Patient smokes but did not specify amount Nicotine replacement therapy offered and denied by patient PRN nicotine 14 mg patch if needed  Substance Abuse Patient uses cocaine. Did not provide any more specifics.  UDS positive for cocaine Cessation education provided and she is willing to quit  Other Stroke Risk Factors ETOH use, advised to drink no more than 1 drink(s) a day   Hospital day # 2  Meryl Dare, MD PGY-1 Psychiatry Resident   ATTENDING NOTE: I reviewed above note and agree with the assessment and plan. Pt was seen and examined.   EEG tech at the bedside. Pt is eating snack. Neuro intact except right big toe numbness. MRI showed small left BG infarct, CTA head and neck, EEG negative. BP on the high end, added norvasc 10. Continue statin lipitor 40. On DAPT for 3 weeks and then ASA alone. PT and OT no recs. Cocaine cessation education provided.   For detailed assessment and plan, please refer to above/below as I have made changes wherever appropriate.   Neurology will sign off. Please call with questions. Pt will follow up with stroke clinic NP at [facility] in about 4 weeks. Thanks for the consult.   Marvel Plan, MD PhD Stroke Neurology 05/21/2023 2:54 PM      To contact Stroke Continuity provider, please refer to [email or web address]. After hours, contact General Neurology

## 2023-05-21 NOTE — Evaluation (Incomplete Revision)
 Speech Language Pathology Evaluation Patient Details Name: Alexandra Buckley MRN: [id number] DOB: Dec 24, 1970 Today's Date: 05/21/2023 Time: 4098-1191 SLP Time Calculation (min) (ACUTE ONLY): 17 min  Problem List:  Patient Active Problem List   Diagnosis Date Noted   Cocaine use 05/20/2023   Tobacco use disorder 05/20/2023   Acute CVA (cerebrovascular accident) (HCC) 05/19/2023   Past Medical History: No past medical history on file.  HPI:  Alexandra Buckley is a 52 y.o. female who denies any past medical history being admitted to the hospital with acute stroke. MRI: Small acute infarct in the posterior aspect of the left putamen, Multiple small foci of encephalomalacia with surrounding gliosis in the centrum semiovale and corona radiata as well as bilateral basal ganglia and right thalamus.She admits to snorting cocaine PTA, lasting remembers walking down the street and falling.  Seems like this was not witnessed, bystander called 911 because patient was found sleeping on the ground next to the road.   Assessment / Plan / Recommendation Clinical Impression  Pt seen for cognitive-linguistic evaluation. Assessment completed via informal means and portions of Cognistat. Pt demonstrated intact basic functional cognitive-linguistic ability. Reduced insight into current diagnosis appreciated. Pt stating, "I wonder if me being tired and hitting my head caused the stroke." Pt's speech is fluent and appropriate. Very minimal and occasional articulatory imprecision noted with no impact on speech intelligibility. Pt politely declined further SLP services.    SLP Assessment  SLP Recommendation/Assessment: Patient does not need any further Speech Lanaguage Pathology Services    Recommendations for follow up therapy are one component of a multi-disciplinary discharge planning process, led by the attending physician.  Recommendations may be updated based on patient status, additional functional criteria and  insurance authorization.    Follow Up Recommendations  No SLP follow up    Assistance Recommended at Discharge   (defer to OT/PT)  Functional Status Assessment Patient has not had a recent decline in their functional status        SLP Evaluation Cognition  Overall Cognitive Status: Within Functional Limits for tasks assessed Orientation Level: Oriented X4 Day of Week: Correct Memory: Appears intact Awareness: Appears intact Problem Solving: Appears intact Executive Function: Reasoning;Sequencing Reasoning: Appears intact Sequencing: Appears intact Self Monitoring: Appears intact Safety/Judgment: Appears intact       Comprehension  Auditory Comprehension Overall Auditory Comprehension: Appears within functional limits for tasks assessed Yes/No Questions: Within Functional Limits Commands: Within Functional Limits Conversation: Simple Interfering Components: Attention    Expression Expression Primary Mode of Expression: Verbal Verbal Expression Overall Verbal Expression: Appears within functional limits for tasks assessed Initiation: No impairment Automatic Speech: Name;Social Response Level of Generative/Spontaneous Verbalization: Conversation Naming: No impairment Pragmatics: No impairment   Oral / Motor  Oral Motor/Sensory Function Overall Oral Motor/Sensory Function: Mild impairment Facial Symmetry: Abnormal symmetry left (vs natural asymmetry) Motor Speech Overall Motor Speech: Appears within functional limits for tasks assessed Respiration: Within functional limits Phonation: Normal Resonance: Within functional limits Articulation: Within functional limitis (very minimal and inconsistent articulatory imprecision) Intelligibility: Intelligible Motor Planning: Witnin functional limits           Clyde Canterbury, M.S., CCC-SLP Speech-Language Pathologist Secure Chat Preferred  O: [phone]  Woodroe Chen 05/21/2023, 10:58 AM

## 2023-05-21 NOTE — Discharge Summary (Addendum)
 Physician Discharge Summary  Alexandra Buckley ZOX:[id number] DOB: 1971-04-12 DOA: 05/19/2023  PCP: Pcp, No  Admit date: 05/19/2023 Discharge date: 05/21/2023 Admitted From: From street Disposition: Home with mother Recommendations for Outpatient Follow-up:  Outpatient follow-up with PCP and neurology as above Reassess BP, CMP and CBC at follow-up Please follow up on the following pending results: None  Home Health: HH PT Equipment/Devices: None  Discharge Condition: Stable CODE STATUS: Full code  Follow-up Information     [facility] Follow up on 06/08/2023.   Specialty: Family Medicine Why: Your appointment is at 10:00 am. Please arrive early and bring picture ID, insurance card and current medications. Contact information: [place] [address] [phone]        [name]. Schedule an appointment as soon as possible for a visit in 1 month(s).   Specialty: Neurology Why: stroke clinic Contact information: [address] [place] [address] [phone]                Hospital course 52 year old F with PMH of cocaine and tobacco use disorder brought to [facility] ED by EMS after found sleeping on driver side by bystander who called EMS.  Patient woke up after 2 rounds of Narcan by EMS.  She admitted to snorting cocaine.  In ED, slightly hypertensive.  Basic labs without significant finding.  CT head showed some abnormality so MRI of brain was done which showed evidence of acute CVA in left putamen and multiple old encephalomalacia suggesting chronic infarct versus multiple sclerosis.  Neurology consulted and recommended transfer to [facility] hospital for further CVA workup.   CT angio head and neck without acute finding.  LDL 110.  A1c 6.1%.  TTE without significant finding.  UDS positive for cocaine.  EEG negative for seizure or epileptiform discharge.  Neurology  recommended aspirin and Plavix for 3 weeks followed by aspirin alone, and aggressive risk reduction.  She has been started on amlodipine for hypertension.  PT recommends acute skilled PT to increase independence and safety with mobility to allow discharge.  HH PT ordered.  Patient planning to go to her mother's home after discharge.  See individual problem list below for more.   Problems addressed during this hospitalization Principal Problem:   Acute CVA (cerebrovascular accident) [name] Active Problems:   Cocaine use   Tobacco use disorder   Acute CVA: Found down on street by bystander who called EMS.  No focal neurodeficit breath some weakness unsteadiness.  CT head showed some abnormality so MRI of brain was done which showed evidence of acute CVA in left putamen and multiple old encephalomalacia suggesting chronic infarct versus multiple sclerosis.  UDS positive for cocaine.  LDL 110.  A1c 6.1%.  TTE without significant finding.  EEG negative. -Discharged on Plavix and aspirin for 3 weeks followed by aspirin alone per neurology recommendation -Continue Lipitor and amlodipine on discharge -Encouraged to refrain from cocaine use and smoking cigarettes. -Outpatient follow-up with PCP and neurology as above.  Essential hypertension: BP elevated. -Started amlodipine -Reassess and adjust as appropriate at follow-up.   Tobacco use disorder: Reports smoking about half a pack a day. -Encouraged cessation -Nicotine patch   Cocaine use: Admits to snorting cocaine.  Denies IVDU.  UDS positive for cocaine -Counseled to refrain from cocaine.  Patient seems to be precontemplative.  When we discussed about risk of cocaine use, she states "it can happen even if you do not use" -  Provided with resources by [facility] as well.   Compliance -Counseled on the importance of compliance with evaluation   Disposition: Patient [place] living in [place].  She says she will  stay with his mother who be back from  [place] today.           Time spent 35 minutes  Vital signs Vitals:   05/21/23 0339 05/21/23 0736 05/21/23 1109 05/21/23 1159  BP: (!) 127/109 (!) 171/82 (!) 172/89 (!) 174/73  Pulse: 66 69 72 66  Temp: 97.9 F (36.6 C) 98.7 F (37.1 C)  98.7 F (37.1 C)  Resp: 18 16  17   Height:      Weight:      SpO2: 100% 99%  99%  TempSrc: Oral Oral  Oral  BMI (Calculated):         Discharge exam  GENERAL: No apparent distress.  Nontoxic. HEENT: MMM.  Vision and hearing grossly intact.  NECK: Supple.  No apparent JVD.  RESP:  No IWOB.  Fair aeration bilaterally. CVS:  RRR. Heart sounds normal.  ABD/GI/GU: BS+. Abd soft, NTND.  MSK/EXT:  Moves extremities. No apparent deformity. No edema.  SKIN: no apparent skin lesion or wound NEURO: Awake and alert. Oriented appropriately.  No apparent focal neuro deficit. PSYCH: Calm. Normal affect.   Discharge Instructions Discharge Instructions     Ambulatory referral to Neurology   Complete by: As directed    Follow up with stroke clinic NP at [facility] in about 4 weeks. Thanks.   Diet - low sodium heart healthy   Complete by: As directed    Discharge instructions   Complete by: As directed    It has been a pleasure taking care of you!  You were hospitalized due to acute stroke.  We have started you on medications to reduce your risk of future stroke.  It is very important that you take your medications as prescribed.  It is also equally important that you quit his smoking cigarette and using cocaine.  Follow-up with your primary care doctor in 1 to 2 weeks or sooner if needed.  Follow-up with neurology per the recommendation.  It is important that you quit smoking cigarettes.  You may use nicotine patch to help you quit smoking.  Nicotine patch is available over-the-counter.  You may also discuss other options to help you quit smoking with your primary care doctor. You can also talk to professional counselors at 1-800-QUIT-NOW  [phone]) for free smoking cessation counseling.     Take care,   Increase activity slowly   Complete by: As directed       Allergies as of 05/21/2023   No Known Allergies      Medication List     TAKE these medications    acetaminophen 325 MG tablet Commonly known as: TYLENOL Take 2 tablets (650 mg total) by mouth every 6 (six) hours as needed for mild pain or headache (or Fever >/= 101).   amLODipine 10 MG tablet Commonly known as: NORVASC Take 1 tablet (10 mg total) by mouth daily. Start taking on: May 22, 2023   aspirin EC 81 MG tablet Take 1 tablet (81 mg total) by mouth daily. Swallow whole.   atorvastatin 40 MG tablet Commonly known as: LIPITOR Take 1 tablet (40 mg total) by mouth daily.   clopidogrel 75 MG tablet Commonly known as: PLAVIX Take 1 tablet (75 mg total) by mouth daily for 21 days.   nicotine 14 mg/24hr patch Commonly known as:  NICODERM CQ - dosed in mg/24 hours Place 1 patch (14 mg total) onto the skin daily as needed (denied before but available if changes mind).        Consultations: Neurology  Procedures/Studies:   EEG adult  Result Date: 05/21/2023 Charlsie Quest, MD     05/21/2023 12:46 PM Patient Name: Alexandra Buckley MRN: [id number] Epilepsy Attending: Charlsie Quest Referring Physician/Provider: Marvel Plan, MD Date: 05/21/2023 Duration: 22.07 mins Patient history: 52yo F presented with history of cocaine abuse and altered mental status getting eeg to evaluate for seizure. Level of alertness: Awake, asleep AEDs during EEG study: None Technical aspects: This EEG study was done with scalp electrodes positioned according to the 10-20 International system of electrode placement. Electrical activity was reviewed with band pass filter of 1-70Hz , sensitivity of 7 uV/mm, display speed of 57mm/sec with a 60Hz  notched filter applied as appropriate. EEG data were recorded continuously and digitally stored.  Video monitoring was  available and reviewed as appropriate. Description: The posterior dominant rhythm consists of 10 Hz activity of moderate voltage (25-35 uV) seen predominantly in posterior head regions, symmetric and reactive to eye opening and eye closing. Sleep was characterized by vertex waves, sleep spindles (12 to 14 Hz), maximal frontocentral region. Hyperventilation and photic stimulation were not performed.   IMPRESSION: This study is within normal limits. No seizures or epileptiform discharges were seen throughout the recording. A normal interictal EEG does not exclude the diagnosis of epilepsy. Charlsie Quest   ECHOCARDIOGRAM COMPLETE BUBBLE STUDY  Result Date: 05/20/2023    ECHOCARDIOGRAM REPORT   Patient Name:   Alexandra Buckley Date of Exam: 05/20/2023 Medical Rec #:  [id number]    Height:       62.0 in Accession #:    [id number]    Weight:       140.0 lb Date of Birth:  11/28/1991     BSA:          1.643 m Patient Age:    51 years      BP:           153/57 mmHg Patient Gender: F             HR:           67 bpm. Exam Location:  Inpatient Procedure: 2D Echo, Cardiac Doppler and Color Doppler Indications:    stroke  History:        Patient has no prior history of Echocardiogram examinations.                 Risk Factors:cocaine use.  Sonographer:    Delcie Roch RDCS Referring Phys: [id number] DEVON SHAFER IMPRESSIONS  1. Left ventricular ejection fraction, by estimation, is 70 to 75%. The left ventricle has hyperdynamic function. The left ventricle has no regional wall motion abnormalities. Left ventricular diastolic parameters are consistent with Grade I diastolic dysfunction (impaired relaxation).  2. Right ventricular systolic function is hyperdynamic. The right ventricular size is normal. There is normal pulmonary artery systolic pressure. The estimated right ventricular systolic pressure is 24.5 mmHg.  3. The saline contrast study was poor quality. Agitated saline contrast bubble study was negative, with no  evidence of any interatrial shunt.  4. The mitral valve is normal in structure. No evidence of mitral valve regurgitation. No evidence of mitral stenosis.  5. The aortic valve is normal in structure. Aortic valve regurgitation is not visualized. No aortic stenosis is present.  6. The inferior vena cava is  normal in size with greater than 50% respiratory variability, suggesting right atrial pressure of 3 mmHg. FINDINGS  Left Ventricle: Left ventricular ejection fraction, by estimation, is 70 to 75%. The left ventricle has hyperdynamic function. The left ventricle has no regional wall motion abnormalities. The left ventricular internal cavity size was normal in size. There is no left ventricular hypertrophy. Left ventricular diastolic parameters are consistent with Grade I diastolic dysfunction (impaired relaxation). Normal left ventricular filling pressure. Right Ventricle: The right ventricular size is normal. No increase in right ventricular wall thickness. Right ventricular systolic function is hyperdynamic. There is normal pulmonary artery systolic pressure. The tricuspid regurgitant velocity is 2.32 m/s, and with an assumed right atrial pressure of 3 mmHg, the estimated right ventricular systolic pressure is 24.5 mmHg. Left Atrium: Left atrial size was normal in size. Right Atrium: Right atrial size was normal in size. Pericardium: There is no evidence of pericardial effusion. Mitral Valve: The mitral valve is normal in structure. No evidence of mitral valve regurgitation. No evidence of mitral valve stenosis. Tricuspid Valve: The tricuspid valve is normal in structure. Tricuspid valve regurgitation is trivial. No evidence of tricuspid stenosis. Aortic Valve: The aortic valve is normal in structure. Aortic valve regurgitation is not visualized. No aortic stenosis is present. Pulmonic Valve: The pulmonic valve was normal in structure. Pulmonic valve regurgitation is not visualized. No evidence of pulmonic  stenosis. Aorta: The aortic root is normal in size and structure. Venous: The inferior vena cava is normal in size with greater than 50% respiratory variability, suggesting right atrial pressure of 3 mmHg. IAS/Shunts: No atrial level shunt detected by color flow Doppler. Agitated saline contrast was given intravenously to evaluate for intracardiac shunting. Agitated saline contrast bubble study was negative, with no evidence of any interatrial shunt.  LEFT VENTRICLE PLAX 2D LVIDd:         4.40 cm   Diastology LVIDs:         2.50 cm   LV e' medial:    9.14 cm/s LV PW:         1.00 cm   LV E/e' medial:  8.0 LV IVS:        1.00 cm   LV e' lateral:   8.70 cm/s LVOT diam:     1.70 cm   LV E/e' lateral: 8.4 LV SV:         60 LV SV Index:   37 LVOT Area:     2.27 cm  RIGHT VENTRICLE             IVC RV Basal diam:  2.20 cm     IVC diam: 1.50 cm RV S prime:     12.20 cm/s TAPSE (M-mode): 1.7 cm LEFT ATRIUM             Index        RIGHT ATRIUM          Index LA diam:        3.30 cm 2.01 cm/m   RA Area:     9.71 cm LA Vol (A2C):   33.2 ml 20.21 ml/m  RA Volume:   20.10 ml 12.24 ml/m LA Vol (A4C):   35.1 ml 21.37 ml/m LA Biplane Vol: 34.2 ml 20.82 ml/m  AORTIC VALVE LVOT Vmax:   133.00 cm/s LVOT Vmean:  85.200 cm/s LVOT VTI:    0.266 m  AORTA Ao Root diam: 2.60 cm Ao Asc diam:  3.00 cm MITRAL VALVE  TRICUSPID VALVE MV Area (PHT): 3.65 cm    TR Peak grad:   21.5 mmHg MV Decel Time: 208 msec    TR Vmax:        232.00 cm/s MV E velocity: 73.10 cm/s MV A velocity: 99.40 cm/s  SHUNTS MV E/A ratio:  0.74        Systemic VTI:  0.27 m                            Systemic Diam: 1.70 cm Thurmon Fair MD Electronically signed by Thurmon Fair MD Signature Date/Time: 05/20/2023/6:12:59 PM    Final    CT ANGIO HEAD NECK W WO CM  Result Date: 05/19/2023 CLINICAL DATA:  Stroke/TIA, determine embolic source. EXAM: CT ANGIOGRAPHY HEAD AND NECK WITH AND WITHOUT CONTRAST TECHNIQUE: Multidetector CT imaging of the head  and neck was performed using the standard protocol during bolus administration of intravenous contrast. Multiplanar CT image reconstructions and MIPs were obtained to evaluate the vascular anatomy. Carotid stenosis measurements (when applicable) are obtained utilizing NASCET criteria, using the distal internal carotid diameter as the denominator. RADIATION DOSE REDUCTION: This exam was performed according to the departmental dose-optimization program which includes automated exposure control, adjustment of the mA and/or kV according to patient size and/or use of iterative reconstruction technique. CONTRAST:  75mL OMNIPAQUE IOHEXOL 350 MG/ML SOLN COMPARISON:  MRI brain 05/19/2023. FINDINGS: CTA NECK FINDINGS Aortic arch: Three-vessel arch configuration. Arch vessel origins are patent. Right carotid system: No evidence of dissection, stenosis (50% or greater), or occlusion. Left carotid system: No evidence of dissection, stenosis (50% or greater), or occlusion. Vertebral arteries: Codominant. No evidence of dissection, stenosis (50% or greater), or occlusion. Skeleton: Unremarkable. Other neck: Unremarkable. Upper chest: No acute findings. Review of the MIP images confirms the above findings CTA HEAD FINDINGS Anterior circulation: Intracranial ICAs are patent without stenosis or aneurysm. The proximal ACAs and MCAs are patent without stenosis or aneurysm. Distal branches are symmetric. Posterior circulation: Normal basilar artery. The SCAs, AICAs and PICAs are patent proximally. The PCAs are patent proximally without stenosis or aneurysm. Distal branches are symmetric. Venous sinuses: As permitted by contrast timing, patent. Anatomic variants: None. Review of the MIP images confirms the above findings IMPRESSION: No large vessel occlusion, hemodynamically significant stenosis, or aneurysm in the head or neck. Electronically Signed   By: Orvan Falconer M.D.   On: 05/19/2023 15:25   MR BRAIN W WO CONTRAST  Result  Date: 05/19/2023 CLINICAL DATA:  Mental status change, cause. EXAM: MRI HEAD WITHOUT AND WITH CONTRAST TECHNIQUE: Multiplanar, multiecho pulse sequences of the brain and surrounding structures were obtained without and with intravenous contrast. CONTRAST:  6mL GADAVIST GADOBUTROL 1 MMOL/ML IV SOLN COMPARISON:  Head CT May 19, 2023. FINDINGS: Brain: Small focus of restricted in the posterior aspect of the left putamen without definite contrast. No hemorrhage, hydrocephalus, extra-axial collection mass lesion multiple small foci encephalomalacia with surrounding gliosis in the centrum semiovale and corona radiata as well as bilateral basal ganglia and right thalamus. Small amount of scattered foci of T2 hyperintensity are seen within the white matter of the cerebral hemispheres. T2 hyperintense lesion of seen on axial T2 images (series 8, image 7) does not have a correlate bone the other images and is likely artifactual. No focus of abnormal contrast enhancement identified. Vascular: Normal flow voids. Skull and upper cervical spine: Normal marrow signal. Sinuses/Orbits: Trace mucosal thickening throughout the paranasal sinuses. The orbits  are. Other: None. IMPRESSION: 1. 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 2. Multiple small foci of encephalomalacia with surrounding gliosis in the centrum semiovale and corona radiata as well as bilateral basal ganglia and right thalamus. While demyelinating disease is an important consideration in this age group, image appearance is suggestive of chronic infarcts, particularly in the setting of a small acute left putaminal infarct. 3. Small amount of scattered nonspecific foci of T2 hyperintensity are seen within the white matter of the cerebral hemispheres may be related to demyelinating, vasculitis and sequela of prior inflammatory/infectious process. Electronically Signed   By: Baldemar Lenis M.D.   On: 05/19/2023 13:41   CT HEAD WO  CONTRAST ( )  Result Date: 05/19/2023 CLINICAL DATA:  Head trauma, minor, normal mental status (Age 54-64y) EXAM: CT HEAD WITHOUT CONTRAST TECHNIQUE: Contiguous axial images were obtained from the base of the skull through the vertex without intravenous contrast. RADIATION DOSE REDUCTION: This exam was performed according to the departmental dose-optimization program which includes automated exposure control, adjustment of the mA and/or kV according to patient size and/or use of iterative reconstruction technique. COMPARISON:  None Available. FINDINGS: Brain: There are scattered nonspecific periventricular subcortical hypodense lesions, for example in the centrum semiovale on the left (series 2, image 22). Given patient's demographics, these could represent demyelinating lesions. No hemorrhage. No hydrocephalus. No extra-axial fluid collection. Vascular: No hyperdense vessel or unexpected calcification. Skull: Normal. Negative for fracture or focal lesion. Sinuses/Orbits: No middle ear or mastoid effusion. Paranasal sinuses are clear. Orbits are unremarkable. Other: None. IMPRESSION: 1. No CT evidence of intracranial injury 2. Scattered nonspecific periventricular subcortical hypodense lesions, for example in the centrum semiovale on the left. Given patient's demographics, these could represent demyelinating lesions. Recommend further evaluation with a brain MRI with and without contrast. Electronically Signed   By: Lorenza Cambridge M.D.   On: 05/19/2023 08:11       The results of significant diagnostics from this hospitalization (including imaging, microbiology, ancillary and laboratory) are listed below for reference.     Microbiology: No results found for this or any previous visit (from the past 240 hour(s)).   Labs:  CBC: Recent Labs  Lab 05/19/23 0858 05/20/23 0026 05/21/23 0625  WBC 6.1 5.7 4.7  NEUTROABS 3.2  --   --   HGB 14.3 13.4 12.9  HCT 46.3* 43.8 41.2  MCV 91.5 89.9 89.0  PLT  272 276 273   BMP &GFR Recent Labs  Lab 05/19/23 0858 05/20/23 0026 05/21/23 0625  NA 139 142 143  K 3.4* 3.8 3.7  CL 104 103 107  CO2 26 24 28   GLUCOSE 87 104* 93  BUN 11 14 16   CREATININE 0.73 0.86 0.71  CALCIUM 9.2 10.2 9.4  MG  --   --  2.0   Estimated Creatinine Clearance: 87.6 mL/min (by C-G formula based on SCr of 0.71 mg/dL). Liver & Pancreas: Recent Labs  Lab 05/19/23 0858 05/20/23 0026  AST 16 20  ALT 18 13  ALKPHOS 111 92  BILITOT 0.5 0.9  PROT 8.0 6.4*  ALBUMIN 4.3 3.3*   No results for input(s): "LIPASE", "AMYLASE" in the last 168 hours. No results for input(s): "AMMONIA" in the last 168 hours. Diabetic: Recent Labs    05/19/23 0858 05/19/23 1936  HGBA1C 6.1* 5.9*   No results for input(s): "GLUCAP" in the last 168 hours. Cardiac Enzymes: Recent Labs  Lab 05/21/23 0625  CKTOTAL 90   No  results for input(s): "PROBNP" in the last 8760 hours. Coagulation Profile: No results for input(s): "INR", "PROTIME" in the last 168 hours. Thyroid Function Tests: No results for input(s): "TSH", "T4TOTAL", "FREET4", "T3FREE", "THYROIDAB" in the last 72 hours. Lipid Profile: Recent Labs    05/20/23 0026  CHOL 190  HDL 58  LDLCALC 110*  TRIG 109  CHOLHDL 3.3   Anemia Panel: No results for input(s): "VITAMINB12", "FOLATE", "FERRITIN", "TIBC", "IRON", "RETICCTPCT" in the last 72 hours. Urine analysis: No results found for: "COLORURINE", "APPEARANCEUR", "LABSPEC", "PHURINE", "GLUCOSEU", "HGBUR", "BILIRUBINUR", "KETONESUR", "PROTEINUR", "UROBILINOGEN", "NITRITE", "LEUKOCYTESUR" Sepsis Labs: Invalid input(s): "PROCALCITONIN", "LACTICIDVEN"   SIGNED:  Almon Hercules, MD  [facility] 05/21/2023, 3:05 PM

## 2023-05-21 NOTE — Progress Notes (Addendum)
 Occupational Therapy Treatment and Discharge Patient Details Name: Alexandra Buckley MRN: [id number] DOB: 08/15/1971 Today's Date: 05/21/2023   History of present illness Alexandra Buckley is a 52 y.o. female admitted 8/14. 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   OT comments  This 52 yo female seen today and is a Mod I with SPC for all basic ADLs in her hospital room environment. No further OT needs, we will sign off.      If plan is discharge home, recommend the following:   (no A needed)   Equipment Recommendations  None recommended by OT       Precautions / Restrictions Precautions Precautions: Fall Restrictions Weight Bearing Restrictions: No       Mobility Bed Mobility Overal bed mobility: Independent                  Transfers Overall transfer level: Modified independent Equipment used: Straight cane Transfers: Sit to/from Stand Sit to Stand: Modified independent (Device/Increase time)           General transfer comment: Slow to rise     Balance Overall balance assessment: Modified Independent                                         ADL either performed or assessed with clinical judgement   ADL Overall ADL's : Independent;Modified independent                                            Extremity/Trunk Assessment Upper Extremity Assessment Upper Extremity Assessment: Overall WFL for tasks assessed            Vision Patient Visual Report: No change from baseline            Cognition Arousal: Alert Behavior During Therapy: WFL for tasks assessed/performed Overall Cognitive Status: Within Functional Limits for tasks  assessed                                                     Pertinent Vitals/ Pain       Pain Assessment Pain Assessment: No/denies pain         Frequency  Min 1X/week        Progress Toward Goals  OT Goals(current goals can now be found in the care plan section)  Progress towards OT goals: Goals met/education completed, patient discharged from OT  Acute Rehab OT Goals Patient Stated Goal: did not state (did want some shoes, social worker looked but they did not have any shoes her size)         AM-PAC OT "6 Clicks" Daily Activity     Outcome Measure   Help from another person eating meals?: None Help from another person taking care of personal grooming?: None Help from another person toileting, which includes using toliet, bedpan, or urinal?: None Help from another person bathing (including washing, rinsing,  drying)?: None Help from another person to put on and taking off regular upper body clothing?: None   6 Click Score: 20    End of Session Equipment Utilized During Treatment:  (SPC)  OT Visit Diagnosis: Unsteadiness on feet (R26.81);Other abnormalities of gait and mobility (R26.89)   Activity Tolerance Patient tolerated treatment well   Patient Left in bed;with call bell/phone within reach;with bed alarm set   Nurse Communication Mobility status        Time: 8295-6213 OT Time Calculation (min): 8 min  Charges: OT General Charges $OT Visit: 1 Visit OT Treatments $Self Care/Home Management : 8-22 mins  Lindon Romp OT Acute Rehabilitation Services Office [phone]    Evette Georges 05/21/2023, 5:41 PM

## 2023-05-22 LAB — HOMOCYSTEINE: Homocysteine: 17.6 umol/L — ABNORMAL HIGH (ref 0.0–14.5)

## 2023-05-23 LAB — BETA-2-GLYCOPROTEIN I ABS, IGG/M/A
Beta-2 Glyco I IgG: 77 GPI IgG units — ABNORMAL HIGH (ref 0–20)
Beta-2-Glycoprotein I IgA: 89 GPI IgA units — ABNORMAL HIGH (ref 0–25)
Beta-2-Glycoprotein I IgM: 14 GPI IgM units (ref 0–32)

## 2023-05-23 LAB — LUPUS ANTICOAGULANT PANEL
DRVVT: 31.9 s (ref 0.0–47.0)
PTT Lupus Anticoagulant: 33.7 s (ref 0.0–43.5)

## 2023-05-24 LAB — ANA W/REFLEX IF POSITIVE: Anti Nuclear Antibody (ANA): NEGATIVE

## 2023-05-25 LAB — CARDIOLIPIN ANTIBODIES, IGG, IGM, IGA
Anticardiolipin IgA: <9 U/mL (ref 0–11)
Anticardiolipin IgG: <9 [GPL'U]/mL (ref 0–14)
Anticardiolipin IgM: <9 [MPL'U]/mL (ref 0–12)

## 2023-06-08 ENCOUNTER — Inpatient Hospital Stay (INDEPENDENT_AMBULATORY_CARE_PROVIDER_SITE_OTHER): Payer: Self-pay | Admitting: Primary Care

## 2023-07-08 ENCOUNTER — Encounter (HOSPITAL_COMMUNITY): Payer: Self-pay | Admitting: Emergency Medicine

## 2023-07-08 ENCOUNTER — Emergency Department (HOSPITAL_COMMUNITY)
Admission: EM | Admit: 2023-07-08 | Discharge: 2023-07-08 | Disposition: A | Discharge status: Home / Self Care | Payer: Self-pay | Attending: Emergency Medicine | Admitting: Emergency Medicine

## 2023-07-08 ENCOUNTER — Ambulatory Visit (HOSPITAL_COMMUNITY)
Admission: EM | Admit: 2023-07-08 | Discharge: 2023-07-09 | Disposition: A | Discharge status: Other Acute Inpatient Hospital | Payer: No Payment, Other | Attending: Nurse Practitioner | Admitting: Nurse Practitioner

## 2023-07-08 ENCOUNTER — Emergency Department (HOSPITAL_COMMUNITY): Payer: Self-pay

## 2023-07-08 ENCOUNTER — Other Ambulatory Visit: Payer: Self-pay

## 2023-07-08 DIAGNOSIS — R059 Cough, unspecified: Secondary | ICD-10-CM | POA: Insufficient documentation

## 2023-07-08 DIAGNOSIS — F17200 Nicotine dependence, unspecified, uncomplicated: Secondary | ICD-10-CM | POA: Insufficient documentation

## 2023-07-08 DIAGNOSIS — F1994 Other psychoactive substance use, unspecified with psychoactive substance-induced mood disorder: Secondary | ICD-10-CM | POA: Insufficient documentation

## 2023-07-08 DIAGNOSIS — Z9151 Personal history of suicidal behavior: Secondary | ICD-10-CM | POA: Insufficient documentation

## 2023-07-08 DIAGNOSIS — E876 Hypokalemia: Secondary | ICD-10-CM

## 2023-07-08 DIAGNOSIS — F1414 Cocaine abuse with cocaine-induced mood disorder: Secondary | ICD-10-CM | POA: Insufficient documentation

## 2023-07-08 DIAGNOSIS — F1990 Other psychoactive substance use, unspecified, uncomplicated: Secondary | ICD-10-CM

## 2023-07-08 DIAGNOSIS — Z5900 Homelessness unspecified: Secondary | ICD-10-CM | POA: Insufficient documentation

## 2023-07-08 DIAGNOSIS — F1910 Other psychoactive substance abuse, uncomplicated: Secondary | ICD-10-CM

## 2023-07-08 DIAGNOSIS — R45851 Suicidal ideations: Secondary | ICD-10-CM | POA: Insufficient documentation

## 2023-07-08 DIAGNOSIS — F1490 Cocaine use, unspecified, uncomplicated: Secondary | ICD-10-CM

## 2023-07-08 LAB — RAPID URINE DRUG SCREEN, HOSP PERFORMED
Amphetamines: NOT DETECTED
Barbiturates: NOT DETECTED
Benzodiazepines: NOT DETECTED
Cocaine: POSITIVE — AB
Opiates: NOT DETECTED
Tetrahydrocannabinol: NOT DETECTED

## 2023-07-08 LAB — CBC
HCT: 48.6 % — ABNORMAL HIGH (ref 36.0–46.0)
Hemoglobin: 15 g/dL (ref 12.0–15.0)
MCH: 28.7 pg (ref 26.0–34.0)
MCHC: 30.9 g/dL (ref 30.0–36.0)
MCV: 93.1 fL (ref 80.0–100.0)
Platelets: 270 10*3/uL (ref 150–400)
RBC: 5.22 MIL/uL — ABNORMAL HIGH (ref 3.87–5.11)
RDW: 14.1 % (ref 11.5–15.5)
WBC: 7.4 10*3/uL (ref 4.0–10.5)
nRBC: 0 % (ref 0.0–0.2)

## 2023-07-08 LAB — COMPREHENSIVE METABOLIC PANEL
ALT: 22 U/L (ref 0–44)
AST: 19 U/L (ref 15–41)
Albumin: 3.9 g/dL (ref 3.5–5.0)
Alkaline Phosphatase: 99 U/L (ref 38–126)
Anion gap: 12 (ref 5–15)
BUN: 12 mg/dL (ref 6–20)
CO2: 24 mmol/L (ref 22–32)
Calcium: 9.6 mg/dL (ref 8.9–10.3)
Chloride: 106 mmol/L (ref 98–111)
Creatinine, Ser: 1.1 mg/dL — ABNORMAL HIGH (ref 0.44–1.00)
GFR, Estimated: 60 mL/min — ABNORMAL LOW (ref >60)
Glucose, Bld: 128 mg/dL — ABNORMAL HIGH (ref 70–99)
Potassium: 2.8 mmol/L — ABNORMAL LOW (ref 3.5–5.1)
Sodium: 142 mmol/L (ref 135–145)
Total Bilirubin: 0.5 mg/dL (ref 0.3–1.2)
Total Protein: 7.5 g/dL (ref 6.5–8.1)

## 2023-07-08 LAB — ETHANOL: Alcohol, Ethyl (B): <10 mg/dL (ref <10)

## 2023-07-08 MED ORDER — LOPERAMIDE HCL 2 MG PO CAPS: 2.0000 mg | ORAL_CAPSULE | ORAL | Status: DC | PRN

## 2023-07-08 MED ORDER — ACETAMINOPHEN 500 MG PO TABS: 500.0000 mg | ORAL_TABLET | Freq: Four times a day (QID) | ORAL | Status: DC | PRN

## 2023-07-08 MED ORDER — POTASSIUM CHLORIDE CRYS ER 20 MEQ PO TBCR
20.0000 meq | EXTENDED_RELEASE_TABLET | Freq: Two times a day (BID) | ORAL | 0 refills | Status: DC
Start: 2023-07-08 — End: 2023-07-13

## 2023-07-08 MED ORDER — GUAIFENESIN 100 MG/5ML PO LIQD: 15.0000 mL | Freq: Four times a day (QID) | ORAL | Status: DC | PRN

## 2023-07-08 MED ORDER — HYDROXYZINE HCL 25 MG PO TABS: 25.0000 mg | ORAL_TABLET | Freq: Four times a day (QID) | ORAL | Status: DC | PRN

## 2023-07-08 MED ORDER — TRAZODONE HCL 50 MG PO TABS: 50.0000 mg | ORAL_TABLET | Freq: Every evening | ORAL | Status: DC | PRN

## 2023-07-08 MED ORDER — CLONIDINE HCL 0.1 MG PO TABS: 0.1000 mg | ORAL_TABLET | Freq: Every day | ORAL | Status: DC

## 2023-07-08 MED ORDER — CLONIDINE HCL 0.1 MG PO TABS
0.1000 mg | ORAL_TABLET | Freq: Four times a day (QID) | ORAL | Status: DC
  Administered 2023-07-09 (×3): 0.1 mg via ORAL
  Filled 2023-07-08 (×4): qty 1

## 2023-07-08 MED ORDER — MAGNESIUM HYDROXIDE 400 MG/5ML PO SUSP: 15.0000 mL | Freq: Every day | ORAL | Status: DC | PRN

## 2023-07-08 MED ORDER — CLONIDINE HCL 0.1 MG PO TABS: 0.1000 mg | ORAL_TABLET | ORAL | Status: DC

## 2023-07-08 MED ORDER — ALUM & MAG HYDROXIDE-SIMETH 200-200-20 MG/5ML PO SUSP: 15.0000 mL | ORAL | Status: DC | PRN

## 2023-07-08 MED ORDER — DICYCLOMINE HCL 20 MG PO TABS: 20.0000 mg | ORAL_TABLET | Freq: Four times a day (QID) | ORAL | Status: DC | PRN

## 2023-07-08 MED ORDER — ONDANSETRON 4 MG PO TBDP: 4.0000 mg | ORAL_TABLET | Freq: Four times a day (QID) | ORAL | Status: DC | PRN

## 2023-07-08 MED ORDER — NAPROXEN 500 MG PO TABS: 500.0000 mg | ORAL_TABLET | Freq: Two times a day (BID) | ORAL | Status: DC | PRN

## 2023-07-08 MED ORDER — METHOCARBAMOL 500 MG PO TABS
500.0000 mg | ORAL_TABLET | Freq: Three times a day (TID) | ORAL | Status: DC | PRN
  Administered 2023-07-09 (×2): 500 mg via ORAL
  Filled 2023-07-08 (×2): qty 1

## 2023-07-08 MED ORDER — POTASSIUM CHLORIDE CRYS ER 20 MEQ PO TBCR
40.0000 meq | EXTENDED_RELEASE_TABLET | Freq: Once | ORAL | Status: AC
  Administered 2023-07-08: 40 meq via ORAL
  Filled 2023-07-08: qty 2

## 2023-07-08 NOTE — ED Provider Notes (Signed)
[place] EMERGENCY DEPARTMENT AT [facility] Provider Note   CSN: [id number] Arrival date & time: 07/08/23  6578     History  Chief Complaint  Patient presents with   Detox: ETOH/Heroin/Crack    Alexandra Buckley is a 52 y.o. female.  Patient with history of polysubstance abuse presents to the emergency department today requesting help with detox from alcohol and crack.  She states that her last alcohol use was the day before yesterday.  She states that last cocaine use was yesterday.  She denies any active hallucinations.  In the past when she has stopped drinking, she denies hallucinations or seizures but did get shaky.  Denies homicidal or suicidal ideations.  Denies any active medical complaints.  She does cough while I am interviewing her.       Home Medications Prior to Admission medications   Medication Sig Start Date End Date Taking? Authorizing Provider  amLODipine (NORVASC) 5 MG tablet Take 1 tablet (5 mg total) by mouth daily. 09/14/22   Mayers, Cari S, PA-C  atorvastatin (LIPITOR) 10 MG tablet Take 1 tablet (10 mg total) by mouth at bedtime. 08/10/22   Mayers, Cari S, PA-C  cyclobenzaprine (FLEXERIL) 10 MG tablet Take 1 tablet (10 mg total) by mouth 3 (three) times daily as needed for muscle spasms. 09/30/22   Mayers, Cari S, PA-C  hydrochlorothiazide (HYDRODIURIL) 25 MG tablet Take 1 tablet (25 mg total) by mouth every morning. 09/14/22   Mayers, Cari S, PA-C  hydrOXYzine (ATARAX) 25 MG tablet Take 1 tablet (25 mg total) by mouth at bedtime as needed. 09/14/22   Mayers, Cari S, PA-C  meloxicam (MOBIC) 15 MG tablet Take 1 tablet (15 mg total) by mouth daily. 08/31/22   Mayers, Cari S, PA-C      Allergies    Patient has no known allergies.    Review of Systems   Review of Systems  Physical Exam Updated Vital Signs BP (!) 158/78   Pulse 91   Temp 98.6 F (37 C) (Oral)   Resp 18   LMP 11/18/2018   SpO2 92%   Physical Exam Vitals and nursing note  reviewed.  Constitutional:      General: She is not in acute distress.    Appearance: She is well-developed.  HENT:     Head: Normocephalic and atraumatic.     Right Ear: External ear normal.     Left Ear: External ear normal.     Nose: Nose normal.  Eyes:     Conjunctiva/sclera: Conjunctivae normal.  Cardiovascular:     Rate and Rhythm: Normal rate and regular rhythm.     Heart sounds: No murmur heard. Pulmonary:     Effort: No respiratory distress.     Breath sounds: Rhonchi present. No wheezing or rales.     Comments: Scattered rhonchi Abdominal:     Palpations: Abdomen is soft.     Tenderness: There is no abdominal tenderness. There is no guarding or rebound.  Musculoskeletal:     Cervical back: Normal range of motion and neck supple.     Right lower leg: No edema.     Left lower leg: No edema.  Skin:    General: Skin is warm and dry.     Findings: No rash.  Neurological:     General: No focal deficit present.     Mental Status: She is alert. Mental status is at baseline.     Motor: No weakness.  Psychiatric:  Attention and Perception: Attention normal. She is attentive.        Mood and Affect: Mood normal.        Speech: Speech normal.        Behavior: Behavior normal. Behavior is cooperative.        Thought Content: Thought content does not include homicidal or suicidal ideation.     Comments: Patient sleeping at start of interview.  She is arousable.     ED Results / Procedures / Treatments   Labs (all labs ordered are listed, but only abnormal results are displayed) Labs Reviewed  COMPREHENSIVE METABOLIC PANEL - Abnormal; Notable for the following components:      Result Value   Potassium 2.8 (*)    Glucose, Bld 128 (*)    Creatinine, Ser 1.10 (*)    GFR, Estimated 60 (*)    All other components within normal limits  CBC - Abnormal; Notable for the following components:   RBC 5.22 (*)    HCT 48.6 (*)    All other components within normal limits   RAPID URINE DRUG SCREEN, HOSP PERFORMED - Abnormal; Notable for the following components:   Cocaine POSITIVE (*)    All other components within normal limits  ETHANOL    EKG None  Radiology No results found.  Procedures Procedures    Medications Ordered in ED Medications  potassium chloride SA (KLOR-CON M) CR tablet 40 mEq (40 mEq Oral Given 07/08/23 2102)    ED Course/ Medical Decision Making/ A&P    Patient seen and examined. History obtained directly from patient.  Reviewed previous behavioral health notes.  Labs/EKG: Personally reviewed and interpreted CBC showing normal white blood cell count, normal hemoglobin; CMP with potassium 2.8, repletion ordered, creatinine minimally elevated at 1.1; UDS positive for cocaine; ethanol undetectable.  Imaging: Ordered chest x-ray due to cough.  Medications/Fluids: Ordered: P.o. potassium supplementation.   Most recent vital signs reviewed and are as follows: BP (!) 155/78 (BP Location: Right Arm)   Pulse 84   Temp 98.9 F (37.2 C) (Oral)   Resp 14   LMP 11/18/2018   SpO2 100%   Initial impression: Polysubstance abuse, no severe withdrawals currently.  9:48 PM Reassessment performed. Patient appears stable.  She is eating a sandwich in the room.  Labs personally reviewed and interpreted including: CBC normal white blood cell count and hemoglobin; CMP hypokalemia 2.8, glucose 120 with normal anion gap, creatinine minimally elevated at 1.10; ethanol negative; UDS positive for cocaine.  Imaging personally visualized and interpreted including: Chest x-ray agree negative.  Reviewed pertinent lab work and imaging with patient at bedside. Questions answered.   Most current vital signs reviewed and are as follows: BP (!) 155/78 (BP Location: Right Arm)   Pulse 84   Temp 98.9 F (37.2 C) (Oral)   Resp 14   LMP 11/18/2018   SpO2 100%   Plan: Discharge with outpatient referrals.  I did discuss patient case and history with  [facility] provider [place] NP to see if patient may be a candidate for care there. They have reviewed patient information and recommend outpatient referrals.  I will provide these for the patient.  Upon discussion, patient is understandably frustrated stating that she is here asking for help.  I have encouraged her to follow-up at behavioral health urgent care if her symptoms worsen.  Prescriptions written for: Potassium supplementation  Other home care instructions discussed: Maintain good hydration, monitor symptoms closely  ED return instructions discussed: New or  worsening symptoms  Follow-up instructions discussed: Patient encouraged to follow-up with outpatient referrals                               Medical Decision Making Amount and/or Complexity of Data Reviewed Labs: ordered. Radiology: ordered.  Risk Prescription drug management.   Patient with substance abuse history, no use for greater than 24 hours.  No severe withdrawals.  No signs of delirium tremens.  She denies suicidal or homicidal ideations.  Strongly encouraged to follow-up with outpatient referrals.  Hypokalemia noted.  No active vomiting or diarrhea.  Oral repletion ordered.  Prescription for supplementation given.        Final Clinical Impression(s) / ED Diagnoses Final diagnoses:  Substance use disorder  Hypokalemia    Rx / DC Orders ED Discharge Orders          Ordered    potassium chloride SA (KLOR-CON M) 20 MEQ tablet  2 times daily        07/08/23 2147              Renne Crigler, PA-C 07/08/23 2200    Laurence Spates, MD 07/08/23 [phone]

## 2023-07-08 NOTE — ED Triage Notes (Signed)
Patient presents to [facility] voluntarily. Patient reports struggling with crack cocaine addiction and requesting rehab and detox. Patient reports addiction for over several years. Patient does endorse SI at this time. Patient does report past Suicide attempt by trying to injest chemicals. Patient denies HI and AVH. Patient reports experiecning paranoia while using crack days ago. Patient is urgent.

## 2023-07-08 NOTE — ED Provider Notes (Signed)
[facility] Continuous Assessment Admission H&P  Date: 07/09/23 Patient Name: Alexandra Buckley MRN: [id number] Chief Complaint: " I need help".  Diagnoses:  Final diagnoses:  Substance induced mood disorder (HCC)  Crack cocaine use  Substance abuse (HCC)  Homelessness  Passive suicidal ideations    HPI: Alexandra Buckley is a 52 year old female with psychiatric history of MDD, Polysubstance abuse, cocaine abuse with cocaine induced mood disorder, suicide attempt, and Insomnia who presented voluntarily as a walk-in to walk-in to [facility] endorsing passive SI and requesting detox from crack cocaine.  Patient was seen face-to-face by this provider and chart reviewed. Per chart review, patient presented to [facility] tonight requesting detox for alcoholism and drug addiction (Heroine/Crack).   Per EDP note "She states that her last alcohol use was the day before yesterday. She states that last cocaine use was yesterday. In the past when she has stopped drinking, she denies hallucinations or seizures but did get shaky.  Lab work up and Imaging completed at the ED with interventions noted. Patient was encouraged her to follow-up at behavioral health urgent care if her symptoms worsen.    Patient is currently presenting with complaints of passive SI, no plans, reports it started yesterday and she is unable to contract for safety. Patient is also requesting detox from crack cocaine. She denies other illicit substance use. UDS positive for cocaine. BAL WNL.  Patient reports she smokes crack daily and last used yesterday.  Patient reports she has been using crack cocaine off/on for about 20 years.   Patient reports she is currently homeless "for a while or less than a year".  Patient endorses a history of substance abuse treatment twice.  Patient reports she plans to go to [facility] after detox here.  Patient endorses a history of suicide attempt in 2018 where "I mixed a bunch of cleaning supplies and that  was the second time".  Support, encouragement, and reassurance provided about ongoing stressors.    Discussed admission to the continuous observation unit for safety monitoring overnight and reevaluate in the a.m. for SI.  Patient may be appropriate for the [facility].  Patient is provided with opportunity for questions. Patient verbalized her understanding and is in agreement.   Total Time spent with patient: 20 minutes  Musculoskeletal  Strength & Muscle Tone: within normal limits Gait & Station: normal Patient leans: N/A  Psychiatric Specialty Exam  Presentation General Appearance:  Disheveled  Eye Contact: Fair  Speech: Clear and Coherent  Speech Volume: Normal  Handedness: Right   Mood and Affect  Mood: Anxious  Affect: Congruent   Thought Process  Thought Processes: Coherent; Goal Directed  Descriptions of Associations:Intact  Orientation:Full (Time, Place and Person)  Thought Content:WDL  Diagnosis of Schizophrenia or Schizoaffective disorder in past: No   Hallucinations:Hallucinations: None  Ideas of Reference:None  Suicidal Thoughts:Suicidal Thoughts: Yes, Passive SI Passive Intent and/or Plan: Without Intent; Without Plan  Homicidal Thoughts:Homicidal Thoughts: No   Sensorium  Memory: Immediate Fair  Judgment: Poor  Insight: Poor   Executive Functions  Concentration: Fair  Attention Span: Fair  Recall: [employer] of Knowledge: Fair  Language: Fair   Psychomotor Activity  Psychomotor Activity: Psychomotor Activity: Mannerisms; Restlessness   Assets  Assets: Manufacturing systems engineer; Desire for Improvement   Sleep  Sleep: Sleep: Poor   Nutritional Assessment (For OBS and FBC admissions only) Has the patient had a weight loss or gain of 10 pounds or more in the last 3 months?: No Has the  patient had a decrease in food intake/or appetite?: No Does the patient have dental problems?: No Does the patient have eating  habits or behaviors that may be indicators of an eating disorder including binging or inducing vomiting?: No Has the patient recently lost weight without trying?: 0 Has the patient been eating poorly because of a decreased appetite?: 0 Malnutrition Screening Tool Score: 0    Physical Exam Constitutional:      General: She is not in acute distress.    Appearance: She is not diaphoretic.  HENT:     Head: Normocephalic.     Right Ear: External ear normal.     Left Ear: External ear normal.     Nose: No congestion.  Eyes:     General:        Right eye: No discharge.        Left eye: No discharge.  Cardiovascular:     Rate and Rhythm: Normal rate.  Pulmonary:     Effort: No respiratory distress.  Chest:     Chest wall: No tenderness.  Neurological:     Mental Status: She is alert and oriented to person, place, and time.  Psychiatric:        Attention and Perception: Attention and perception normal.        Mood and Affect: Mood is anxious.        Speech: Speech normal.        Behavior: Behavior is cooperative.        Thought Content: Thought content is not paranoid or delusional. Thought content includes suicidal ideation. Thought content does not include homicidal ideation. Thought content does not include homicidal or suicidal plan.        Cognition and Memory: Cognition and memory normal.    Review of Systems  Constitutional:  Negative for chills, diaphoresis and fever.  HENT:  Negative for congestion.   Eyes:  Negative for discharge.  Respiratory:  Positive for cough. Negative for shortness of breath and wheezing.   Cardiovascular:  Negative for chest pain and palpitations.  Gastrointestinal:  Negative for diarrhea, nausea and vomiting.  Neurological:  Negative for dizziness, seizures, loss of consciousness, weakness and headaches.  Psychiatric/Behavioral:  Positive for substance abuse. The patient is nervous/anxious.     Blood pressure (!) 161/99, pulse 89, temperature  98 F (36.7 C), temperature source Oral, resp. rate 18, last menstrual period 11/18/2018, SpO2 100%. There is no height or weight on file to calculate BMI.  Past Psychiatric History: See H & P   Is the patient at risk to self? Yes  Has the patient been a risk to self in the past 6 months? No .    Has the patient been a risk to self within the distant past? Yes   Is the patient a risk to others? No   Has the patient been a risk to others in the past 6 months? No   Has the patient been a risk to others within the distant past? No   Past Medical History: See Chart  Family History: N/A  Social History: N/A  Last Labs:  Admission on 07/08/2023, Discharged on 07/08/2023  Component Date Value Ref Range Status   Sodium 07/08/2023 142  135 - 145 mmol/L Final   Potassium 07/08/2023 2.8 (L)  3.5 - 5.1 mmol/L Final   Chloride 07/08/2023 106  98 - 111 mmol/L Final   CO2 07/08/2023 24  22 - 32 mmol/L Final   Glucose, Bld 07/08/2023 128 (  H)  70 - 99 mg/dL Final   Glucose reference range applies only to samples taken after fasting for at least 8 hours.   BUN 07/08/2023 12  6 - 20 mg/dL Final   Creatinine, Ser 07/08/2023 1.10 (H)  0.44 - 1.00 mg/dL Final   Calcium 40/98/1191 9.6  8.9 - 10.3 mg/dL Final   Total Protein 47/82/9562 7.5  6.5 - 8.1 g/dL Final   Albumin 13/05/6577 3.9  3.5 - 5.0 g/dL Final   AST 46/96/2952 19  15 - 41 U/L Final   ALT 07/08/2023 22  0 - 44 U/L Final   Alkaline Phosphatase 07/08/2023 99  38 - 126 U/L Final   Total Bilirubin 07/08/2023 0.5  0.3 - 1.2 mg/dL Final   GFR, Estimated 07/08/2023 60 (L)  >60 mL/min Final   Comment: (NOTE) Calculated using the CKD-EPI Creatinine Equation (2021)    Anion gap 07/08/2023 12  5 - 15 Final   Performed at [facility], [address], [place], [place] [address]   Alcohol, Ethyl (B) 07/08/2023 <10  <10 mg/dL Final   Comment: (NOTE) Lowest detectable limit for serum alcohol is 10 mg/dL.  For medical purposes  only. Performed at [facility], [address], [place], [place] [address]    WBC 07/08/2023 7.4  4.0 - 10.5 K/uL Final   RBC 07/08/2023 5.22 (H)  3.87 - 5.11 MIL/uL Final   Hemoglobin 07/08/2023 15.0  12.0 - 15.0 g/dL Final   HCT 27/25/3664 48.6 (H)  36.0 - 46.0 % Final   MCV 07/08/2023 93.1  80.0 - 100.0 fL Final   MCH 07/08/2023 28.7  26.0 - 34.0 pg Final   MCHC 07/08/2023 30.9  30.0 - 36.0 g/dL Final   RDW 40/34/7425 14.1  11.5 - 15.5 % Final   Platelets 07/08/2023 270  150 - 400 K/uL Final   nRBC 07/08/2023 0.0  0.0 - 0.2 % Final   Performed at [facility], [address], [place], [place] [address]   Opiates 07/08/2023 NONE DETECTED  NONE DETECTED Final   Cocaine 07/08/2023 POSITIVE (A)  NONE DETECTED Final   Benzodiazepines 07/08/2023 NONE DETECTED  NONE DETECTED Final   Amphetamines 07/08/2023 NONE DETECTED  NONE DETECTED Final   Tetrahydrocannabinol 07/08/2023 NONE DETECTED  NONE DETECTED Final   Barbiturates 07/08/2023 NONE DETECTED  NONE DETECTED Final   Comment: (NOTE) DRUG SCREEN FOR MEDICAL PURPOSES ONLY.  IF CONFIRMATION IS NEEDED FOR ANY PURPOSE, NOTIFY LAB WITHIN 5 DAYS.  LOWEST DETECTABLE LIMITS FOR URINE DRUG SCREEN Drug Class                     Cutoff (ng/mL) Amphetamine and metabolites    1000 Barbiturate and metabolites    200 Benzodiazepine                 200 Opiates and metabolites        300 Cocaine and metabolites        300 THC                            50 Performed at [facility], [address] [place], [place] [address]     Allergies: Patient has no known allergies.  Medications:  Facility Ordered Medications  Medication   [COMPLETED] potassium chloride SA (KLOR-CON M) CR tablet 40 mEq   alum & mag hydroxide-simeth (MAALOX/MYLANTA) 200-200-20 MG/5ML suspension 15  mL   magnesium hydroxide (MILK OF MAGNESIA) suspension 15 mL   dicyclomine (BENTYL) tablet 20 mg   hydrOXYzine (ATARAX) tablet 25 mg   loperamide  (IMODIUM) capsule 2-4 mg   methocarbamol (ROBAXIN) tablet 500 mg   naproxen (NAPROSYN) tablet 500 mg   ondansetron (ZOFRAN-ODT) disintegrating tablet 4 mg   traZODone (DESYREL) tablet 50 mg   cloNIDine (CATAPRES) tablet 0.1 mg   Followed by   Melene Muller ON 07/10/2023] cloNIDine (CATAPRES) tablet 0.1 mg   Followed by   Melene Muller ON 07/13/2023] cloNIDine (CATAPRES) tablet 0.1 mg   guaiFENesin (ROBITUSSIN) 100 MG/5ML liquid 15 mL   PTA Medications  Medication Sig   atorvastatin (LIPITOR) 10 MG tablet Take 1 tablet (10 mg total) by mouth at bedtime.   meloxicam (MOBIC) 15 MG tablet Take 1 tablet (15 mg total) by mouth daily.   amLODipine (NORVASC) 5 MG tablet Take 1 tablet (5 mg total) by mouth daily.   hydrochlorothiazide (HYDRODIURIL) 25 MG tablet Take 1 tablet (25 mg total) by mouth every morning.   hydrOXYzine (ATARAX) 25 MG tablet Take 1 tablet (25 mg total) by mouth at bedtime as needed.   cyclobenzaprine (FLEXERIL) 10 MG tablet Take 1 tablet (10 mg total) by mouth 3 (three) times daily as needed for muscle spasms.      Medical Decision Making  Recommend admission to the continuous observation unit overnight for safety monitoring. Patient endorse passive SI, unable to contract for safety.  Recommend re-eval in the am for SI/HI/AVH. Patient may be appropriate for the [facility].   I have reviewed the labs resulted and med given at [facility] and Per EDP :CBC showing normal white blood cell count, normal hemoglobin; CMP with potassium 2.8, repletion ordered, creatinine minimally elevated at 1.1; UDS positive for cocaine; ethanol undetectable. Imaging: Ordered chest x-ray due to cough. Chest x-ray negative.    Potassium supplementation provided at the ED with Klor-Con 40 mEq given at 2102 pm for hypokalemia.   Recheck potassium levels in the am . Lab Orders         Potassium       Recommend [facility]ows protocol  Other Prns-Maalox/indigestion, Robitussin/cough, MOM/constipation,  Trazodone/insomnia.  Recommendations  Based on my evaluation the patient does not appear to have an emergency medical condition.  Recommend admission to the continuous observation unit overnight for safety monitoring.  Recommend Cows protocol.   Mancel Bale, NP 07/09/23  12:21 AM

## 2023-07-08 NOTE — Progress Notes (Signed)
   07/08/23 2235  [facility] Triage Screening (Walk-ins at [facility] only)  How Did You Hear About [place]? Self  What Is the Reason for Your Visit/Call Today? Patient presents to [facility] voluntarily. Patient reports struggling with crack cocaine addiction and requesting rehab and detox. Patient reports addiction for over several years. Patient does endorse SI at this time. Patient does report past Suicide attempt by trying to injest chemicals. Patient denies HI and AVH. Patient reports experiecning paranoia while using crack days ago. Patient is urgent.  How Long Has This Been Causing You Problems? <Week  Have You Recently Had Any Thoughts About Hurting Yourself? Yes  How long ago did you have thoughts about hurting yourself? Yesterday  Are You Planning to Commit Suicide/Harm Yourself At This time? No  Have you Recently Had Thoughts About Hurting Someone Karolee Ohs? No  Are You Planning To Harm Someone At This Time? No  Are you currently experiencing any auditory, visual or other hallucinations? No  Have You Used Any Alcohol or Drugs in the Past 24 Hours? No  Do you have any current medical co-morbidities that require immediate attention? Yes  Please describe current medical co-morbidities that require immediate attention: High blood pressure  Clinician description of patient physical appearance/behavior: Patient is fairly groomed and cooperative.  What Do You Feel Would Help You the Most Today? Treatment for Depression or other mood problem  If access to [facility] was not available, would you have sought care in the Emergency Department? Yes  Determination of Need Routine (7 days)  Options For Referral Facility-Based Crisis

## 2023-07-08 NOTE — Discharge Instructions (Addendum)
Your potassium is low, please take the supplement prescribed.  Please follow-up with the outpatient referrals provided.  If your symptoms get worse go to behavioral health urgent care.  Return to the emergency department with any acute concerns.

## 2023-07-08 NOTE — ED Triage Notes (Signed)
Patient requesting detox for alcoholism and drug addiction ( Heroin/Crack) . Denies SI or HI .

## 2023-07-09 ENCOUNTER — Other Ambulatory Visit (HOSPITAL_COMMUNITY)
Admission: EM | Admit: 2023-07-09 | Discharge: 2023-07-14 | Disposition: A | Discharge status: Home / Self Care | Payer: No Payment, Other | Attending: Psychiatry | Admitting: Psychiatry

## 2023-07-09 DIAGNOSIS — F419 Anxiety disorder, unspecified: Secondary | ICD-10-CM | POA: Insufficient documentation

## 2023-07-09 DIAGNOSIS — F331 Major depressive disorder, recurrent, moderate: Secondary | ICD-10-CM | POA: Insufficient documentation

## 2023-07-09 DIAGNOSIS — I10 Essential (primary) hypertension: Secondary | ICD-10-CM | POA: Insufficient documentation

## 2023-07-09 DIAGNOSIS — F1414 Cocaine abuse with cocaine-induced mood disorder: Secondary | ICD-10-CM | POA: Diagnosis present

## 2023-07-09 DIAGNOSIS — Z9151 Personal history of suicidal behavior: Secondary | ICD-10-CM

## 2023-07-09 DIAGNOSIS — F1994 Other psychoactive substance use, unspecified with psychoactive substance-induced mood disorder: Secondary | ICD-10-CM | POA: Diagnosis not present

## 2023-07-09 DIAGNOSIS — F4310 Post-traumatic stress disorder, unspecified: Secondary | ICD-10-CM | POA: Insufficient documentation

## 2023-07-09 DIAGNOSIS — Z5900 Homelessness unspecified: Secondary | ICD-10-CM | POA: Insufficient documentation

## 2023-07-09 DIAGNOSIS — F17200 Nicotine dependence, unspecified, uncomplicated: Secondary | ICD-10-CM | POA: Diagnosis present

## 2023-07-09 DIAGNOSIS — E782 Mixed hyperlipidemia: Secondary | ICD-10-CM | POA: Diagnosis present

## 2023-07-09 DIAGNOSIS — Z79899 Other long term (current) drug therapy: Secondary | ICD-10-CM | POA: Insufficient documentation

## 2023-07-09 DIAGNOSIS — Z8673 Personal history of transient ischemic attack (TIA), and cerebral infarction without residual deficits: Secondary | ICD-10-CM

## 2023-07-09 LAB — POTASSIUM: Potassium: 3.8 mmol/L (ref 3.5–5.1)

## 2023-07-09 MED ORDER — ACETAMINOPHEN 325 MG PO TABS
650.0000 mg | ORAL_TABLET | Freq: Four times a day (QID) | ORAL | Status: DC | PRN
  Administered 2023-07-11: 650 mg via ORAL
  Filled 2023-07-09: qty 2

## 2023-07-09 MED ORDER — MAGNESIUM HYDROXIDE 400 MG/5ML PO SUSP: 30.0000 mL | Freq: Every day | ORAL | Status: DC | PRN

## 2023-07-09 MED ORDER — ALUM & MAG HYDROXIDE-SIMETH 200-200-20 MG/5ML PO SUSP: 30.0000 mL | ORAL | Status: DC | PRN

## 2023-07-09 NOTE — ED Notes (Signed)
Patient in the dayroom watching TV with other patients. Respirations are even and unlabored.  Will continue to monitor for safety.

## 2023-07-09 NOTE — ED Notes (Signed)
Patient resting with eyes closed. Respirations even and unlabored. No acute distress noted. Environment secured. Will continue to monitor for safety.

## 2023-07-09 NOTE — ED Provider Notes (Addendum)
FBC/OBS ASAP Discharge Summary  Date and Time: 07/09/2023 10:40 AM  Name: Alexandra Buckley  MRN:  [id number]   Discharge Diagnoses:  Final diagnoses:  Substance induced mood disorder (HCC)  Crack cocaine use  Substance abuse (HCC)  Homelessness  Passive suicidal ideations    Subjective: "I have been doing drugs for over 20 years"  Stay Summary:  Alexandra Buckley is a 52 year-old female admitted voluntarily to observation unit last night. She presented with chief complaint of crack/cocaine abuse. Per chart review, patient presents with a psychiatric hx of MDD, polysubstance abuse, cocaine induced mood disorder, suicide attempts and insomnia.  Patient had presented to [facility] prior to coming here for same problem and was encouraged to follow up at [facility] as needed. She presented to [facility] with passive suicidal ideations and unable to contract for safety. Reported that she smokes crack/cocaine for about 20 years. She reported being homeless. Patient has had multiple to [facility] system for different reasons including substance abuse.  She reported that she has had long term substance abuse treatments and she is willing to give it a chance again. Patient was admitted to observation unit and COW protocol was initiated.   Assessment: Patient is seen face-to-face by this provider to evaluate progress. She is found in bed awake but restless, tired and sleepy. She is disheveled with poor hygiene. She is alert and oriented x 4. She has poor eye contact and reports that she feels sleepy. Her thought process is clear and goal-directed. Patient  denies SI. She denies HI/AVH. Admits to expressing SI last night  "because I was tired". She reports that she is here "because of drugs". Reports that she is tired of using drugs and looking into a long term treatment. Patient reports that her drug of choice is crack/cocaine. She reports that she has been using it for over 20 years and this has made her homeless. She reports a  hx of long term treatment for SA and the last one was in January at [facility]: patient  completed treatment, was discharged but relapsed in February.  Patient denies medical issues. She denies pain. Denies dizziness/headache. Denies respiratory difficulties. Denies chest/back pain. Denies nausea/vomiting.  She reports that she has not been sleeping well. Her appetite is good.   Patient expresses motivation for treatment. However, she not very active in this assessment. She has been sleeping for the majority of this shift.  We will continue to monitor and provide support. She will continue on current treatment and plan is to transfer to [facility].   Total Time spent with patient: 30 minutes  Past Psychiatric History: SUD, MDD Past Medical History: NA Family History: NA Family Psychiatric History: NA Social History: Homeless Tobacco Cessation:  Prescription not provided because: patient refused  Current Medications:  Current Facility-Administered Medications  Medication Dose Route Frequency Provider Last Rate Last Admin   alum & mag hydroxide-simeth (MAALOX/MYLANTA) 200-200-20 MG/5ML suspension 15 mL  15 mL Oral Q4H PRN Onuoha, Chinwendu V, NP       cloNIDine (CATAPRES) tablet 0.1 mg  0.1 mg Oral QID Onuoha, Chinwendu V, NP   0.1 mg at 07/09/23 0919   Followed by   Melene Muller ON 07/10/2023] cloNIDine (CATAPRES) tablet 0.1 mg  0.1 mg Oral BH-qamhs Onuoha, Chinwendu V, NP       Followed by   Melene Muller ON 07/13/2023] cloNIDine (CATAPRES) tablet 0.1 mg  0.1 mg Oral QAC breakfast Onuoha, Chinwendu V, NP       dicyclomine (BENTYL) tablet  20 mg  20 mg Oral Q6H PRN Onuoha, Chinwendu V, NP       guaiFENesin (ROBITUSSIN) 100 MG/5ML liquid 15 mL  15 mL Oral Q6H PRN Onuoha, Chinwendu V, NP       hydrOXYzine (ATARAX) tablet 25 mg  25 mg Oral Q6H PRN Onuoha, Chinwendu V, NP       loperamide (IMODIUM) capsule 2-4 mg  2-4 mg Oral PRN Onuoha, Chinwendu V, NP       magnesium hydroxide (MILK OF MAGNESIA) suspension 15 mL  15 mL  Oral Daily PRN Onuoha, Chinwendu V, NP       methocarbamol (ROBAXIN) tablet 500 mg  500 mg Oral Q8H PRN Onuoha, Chinwendu V, NP   500 mg at 07/09/23 0919   naproxen (NAPROSYN) tablet 500 mg  500 mg Oral BID PRN Onuoha, Chinwendu V, NP       ondansetron (ZOFRAN-ODT) disintegrating tablet 4 mg  4 mg Oral Q6H PRN Onuoha, Chinwendu V, NP       traZODone (DESYREL) tablet 50 mg  50 mg Oral QHS PRN Onuoha, Chinwendu V, NP       Current Outpatient Medications  Medication Sig Dispense Refill   potassium chloride SA (KLOR-CON M) 20 MEQ tablet Take 1 tablet (20 mEq total) by mouth 2 (two) times daily. 10 tablet 0   amLODipine (NORVASC) 5 MG tablet Take 1 tablet (5 mg total) by mouth daily. 30 tablet 1   atorvastatin (LIPITOR) 10 MG tablet Take 1 tablet (10 mg total) by mouth at bedtime. 30 tablet 1   cyclobenzaprine (FLEXERIL) 10 MG tablet Take 1 tablet (10 mg total) by mouth 3 (three) times daily as needed for muscle spasms. 60 tablet 0   hydrochlorothiazide (HYDRODIURIL) 25 MG tablet Take 1 tablet (25 mg total) by mouth every morning. 30 tablet 1   hydrOXYzine (ATARAX) 25 MG tablet Take 1 tablet (25 mg total) by mouth at bedtime as needed. 30 tablet 1   meloxicam (MOBIC) 15 MG tablet Take 1 tablet (15 mg total) by mouth daily. 30 tablet 1    PTA Medications:  Facility Ordered Medications  Medication   [COMPLETED] potassium chloride SA (KLOR-CON M) CR tablet 40 mEq   alum & mag hydroxide-simeth (MAALOX/MYLANTA) 200-200-20 MG/5ML suspension 15 mL   magnesium hydroxide (MILK OF MAGNESIA) suspension 15 mL   dicyclomine (BENTYL) tablet 20 mg   hydrOXYzine (ATARAX) tablet 25 mg   loperamide (IMODIUM) capsule 2-4 mg   methocarbamol (ROBAXIN) tablet 500 mg   naproxen (NAPROSYN) tablet 500 mg   ondansetron (ZOFRAN-ODT) disintegrating tablet 4 mg   traZODone (DESYREL) tablet 50 mg   cloNIDine (CATAPRES) tablet 0.1 mg   Followed by   Melene Muller ON 07/10/2023] cloNIDine (CATAPRES) tablet 0.1 mg   Followed by    Melene Muller ON 07/13/2023] cloNIDine (CATAPRES) tablet 0.1 mg   guaiFENesin (ROBITUSSIN) 100 MG/5ML liquid 15 mL   PTA Medications  Medication Sig   atorvastatin (LIPITOR) 10 MG tablet Take 1 tablet (10 mg total) by mouth at bedtime.   meloxicam (MOBIC) 15 MG tablet Take 1 tablet (15 mg total) by mouth daily.   amLODipine (NORVASC) 5 MG tablet Take 1 tablet (5 mg total) by mouth daily.   hydrochlorothiazide (HYDRODIURIL) 25 MG tablet Take 1 tablet (25 mg total) by mouth every morning.   hydrOXYzine (ATARAX) 25 MG tablet Take 1 tablet (25 mg total) by mouth at bedtime as needed.   cyclobenzaprine (FLEXERIL) 10 MG tablet Take 1 tablet (10 mg total)  by mouth 3 (three) times daily as needed for muscle spasms.       07/09/2023   10:38 AM 08/06/2022    2:06 PM 08/04/2022    5:38 PM  Depression screen PHQ 2/9  Decreased Interest 1 3 3   Down, Depressed, Hopeless 1 1 3   PHQ - 2 Score 2 4 6   Altered sleeping 1 0 1  Tired, decreased energy 1 0 2  Change in appetite 0 1 1  Feeling bad or failure about yourself  1 1 3   Trouble concentrating 1  1  Moving slowly or fidgety/restless 1 2 2   Suicidal thoughts 0 2 3  PHQ-9 Score 7 10 19   Difficult doing work/chores Very difficult Somewhat difficult --    Flowsheet Row ED from 07/08/2023 in [facility] Most recent reading at 07/09/2023  2:09 AM ED from 07/08/2023 in [facility] Emergency Department at [facility] Most recent reading at 07/08/2023  7:09 PM ED from 08/20/2022 in [facility] Emergency Department at [facility] Most recent reading at 08/20/2022  1:01 PM  C-SSRS RISK CATEGORY High Risk No Risk No Risk       Musculoskeletal  Strength & Muscle Tone: within normal limits Gait & Station: normal Patient leans: N/A  Psychiatric Specialty Exam  Presentation  General Appearance:  Disheveled  Eye Contact: Poor  Speech: Pressured  Speech Volume: Normal  Handedness: Right   Mood and  Affect  Mood: Anxious  Affect: Congruent   Thought Process  Thought Processes: Coherent  Descriptions of Associations:Intact  Orientation:Full (Time, Place and Person)  Thought Content:Logical  Diagnosis of Schizophrenia or Schizoaffective disorder in past: No    Hallucinations:Hallucinations: None  Ideas of Reference:None  Suicidal Thoughts:Suicidal Thoughts: No SI Passive Intent and/or Plan: Without Intent; Without Plan  Homicidal Thoughts:Homicidal Thoughts: No   Sensorium  Memory: Immediate Fair; Recent Fair; Remote Fair  Judgment: Fair  Insight: Fair   Chartered certified accountant: Fair  Attention Span: Fair  Recall: [employer] of Knowledge: Fair  Language: Fair   Psychomotor Activity  Psychomotor Activity: Psychomotor Activity: Restlessness   Assets  Assets: Manufacturing systems engineer; Desire for Improvement; Physical Health   Sleep  Sleep: Sleep: Poor   Nutritional Assessment (For OBS and FBC admissions only) Has the patient had a weight loss or gain of 10 pounds or more in the last 3 months?: No Has the patient had a decrease in food intake/or appetite?: No Does the patient have dental problems?: No Does the patient have eating habits or behaviors that may be indicators of an eating disorder including binging or inducing vomiting?: No Has the patient recently lost weight without trying?: 0 Has the patient been eating poorly because of a decreased appetite?: 0 Malnutrition Screening Tool Score: 0    Physical Exam  Physical Exam Vitals and nursing note reviewed.  Constitutional:      Appearance: Normal appearance.  HENT:     Head: Normocephalic and atraumatic.     Right Ear: Tympanic membrane normal.     Left Ear: Tympanic membrane normal.     Nose: Nose normal.  Eyes:     Extraocular Movements: Extraocular movements intact.     Pupils: Pupils are equal, round, and reactive to light.  Cardiovascular:     Rate and  Rhythm: Normal rate.     Pulses: Normal pulses.  Pulmonary:     Effort: Pulmonary effort is normal.  Musculoskeletal:  General: Normal range of motion.     Cervical back: Normal range of motion and neck supple.  Neurological:     General: No focal deficit present.     Mental Status: She is alert and oriented to person, place, and time.    Review of Systems  Constitutional: Negative.   HENT: Negative.    Eyes: Negative.   Respiratory: Negative.    Cardiovascular: Negative.   Gastrointestinal: Negative.   Genitourinary: Negative.   Musculoskeletal: Negative.   Skin: Negative.   Neurological: Negative.   Endo/Heme/Allergies: Negative.   Psychiatric/Behavioral:  Positive for depression and substance abuse.    Blood pressure (!) 164/78, pulse 66, temperature 98.4 F (36.9 C), temperature source Oral, resp. rate 17, last menstrual period 11/18/2018, SpO2 100%. There is no height or weight on file to calculate BMI.  Demographic Factors:  Low socioeconomic status, Unemployed, and homeless  Loss Factors: NA  Historical Factors: NA  Risk Reduction Factors:   NA  Continued Clinical Symptoms:  Depression:   Anhedonia Hopelessness Impulsivity  Cognitive Features That Contribute To Risk:  None    Suicide Risk:  Minimal: No identifiable suicidal ideation.  Patients presenting with no risk factors but with morbid ruminations; may be classified as minimal risk based on the severity of the depressive symptoms  Plan Of Care/Follow-up recommendations:    Disposition: discharge and readmit to [facility]  Olin Pia, NP 07/09/2023, 10:40 AM

## 2023-07-09 NOTE — Group Note (Signed)
Group Topic: Communication  Group Date: 07/09/2023 Start Time: 2016 End Time: 2117 Facilitators: Rae Lips B  Department: [facility]  Number of Participants: 3  Group Focus: acceptance Treatment Modality:  Individual Therapy Interventions utilized were story telling and support Purpose: regain self-worth  Name: Alexandra Buckley Date of Birth: 03/11/1971  MR: [id number]    Level of Participation: PT DID NOT ATTEND GROUP Quality of Participation: restless and withdrawn Interactions with others: Pt just came on the unit hasn't talked to anyone yet Mood/Affect: appropriate and restless Triggers (if applicable): NA Cognition: not focused Progress: None Response: NA Plan: patient will be encouraged to keep going to groups.   Patients Problems:  Patient Active Problem List   Diagnosis Date Noted   History of trichomoniasis 10/08/2022   Recurrent major depressive disorder, in remission (HCC) 08/31/2022   Psychophysiological insomnia 08/31/2022   Essential hypertension 08/11/2022   Chronic left shoulder pain 08/11/2022   Chronic bilateral low back pain without sciatica 08/11/2022   Mixed hyperlipidemia 08/11/2022   History of suicide attempt 08/05/2022   Tobacco use disorder 08/05/2022   Cocaine abuse with cocaine-induced mood disorder (HCC) 08/04/2022   MDD (major depressive disorder), recurrent severe, without psychosis (HCC) 03/02/2017
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
## 2023-07-09 NOTE — ED Notes (Signed)
Patient A&Ox4. Denies intent to harm self/others when asked. Denies A/VH. Patient denies any physical complaints when asked. Routine safety checks conducted according to facility protocol. Encouraged patient to notify staff if thoughts of harm toward self or others arise. Endorses safety. Patient verbalized understanding and agreement. Will continue to monitor for safety.

## 2023-07-09 NOTE — ED Notes (Signed)
Patient admitted to [facility] thru [facility]. Here for detox from cocaine. On arrival, AOx 4 but dishevels. Seen tired and sleepy during admission process but cooperative. Snacks given.  Denies SI/HI & AVH. Will continue to monitor for safety.

## 2023-07-09 NOTE — ED Notes (Signed)
Report given to [facility] 

## 2023-07-09 NOTE — ED Notes (Signed)
Pt sleeping@this time breathing even and unlabored will continue to monitor for safety 

## 2023-07-10 ENCOUNTER — Encounter (HOSPITAL_COMMUNITY): Payer: Self-pay | Admitting: Psychiatry

## 2023-07-10 DIAGNOSIS — F1994 Other psychoactive substance use, unspecified with psychoactive substance-induced mood disorder: Secondary | ICD-10-CM | POA: Diagnosis not present

## 2023-07-10 DIAGNOSIS — F1414 Cocaine abuse with cocaine-induced mood disorder: Secondary | ICD-10-CM | POA: Diagnosis not present

## 2023-07-10 DIAGNOSIS — I10 Essential (primary) hypertension: Secondary | ICD-10-CM | POA: Diagnosis not present

## 2023-07-10 DIAGNOSIS — F331 Major depressive disorder, recurrent, moderate: Secondary | ICD-10-CM | POA: Diagnosis not present

## 2023-07-10 MED ORDER — ASPIRIN 81 MG PO TBEC
81.0000 mg | DELAYED_RELEASE_TABLET | Freq: Every day | ORAL | Status: DC
  Administered 2023-07-10 – 2023-07-13 (×4): 81 mg via ORAL
  Filled 2023-07-10 (×4): qty 1
  Filled 2023-07-10: qty 14

## 2023-07-10 MED ORDER — ATORVASTATIN CALCIUM 40 MG PO TABS
40.0000 mg | ORAL_TABLET | Freq: Every day | ORAL | Status: DC
  Administered 2023-07-10 – 2023-07-13 (×4): 40 mg via ORAL
  Filled 2023-07-10: qty 14
  Filled 2023-07-10 (×4): qty 1

## 2023-07-10 MED ORDER — AMLODIPINE BESYLATE 5 MG PO TABS
5.0000 mg | ORAL_TABLET | Freq: Every day | ORAL | Status: DC
  Administered 2023-07-10 – 2023-07-13 (×4): 5 mg via ORAL
  Filled 2023-07-10 (×2): qty 1
  Filled 2023-07-10: qty 14
  Filled 2023-07-10 (×2): qty 1

## 2023-07-10 MED ORDER — IBUPROFEN 400 MG PO TABS
400.0000 mg | ORAL_TABLET | Freq: Once | ORAL | Status: AC
  Administered 2023-07-10: 400 mg via ORAL
  Filled 2023-07-10: qty 1

## 2023-07-10 MED ORDER — IBUPROFEN 200 MG PO TABS
200.0000 mg | ORAL_TABLET | ORAL | Status: DC | PRN
  Administered 2023-07-10 – 2023-07-13 (×6): 200 mg via ORAL
  Filled 2023-07-10 (×6): qty 1

## 2023-07-10 MED ORDER — CLOPIDOGREL BISULFATE 75 MG PO TABS
75.0000 mg | ORAL_TABLET | Freq: Every day | ORAL | Status: DC
  Administered 2023-07-10 – 2023-07-11 (×2): 75 mg via ORAL
  Filled 2023-07-10 (×2): qty 1

## 2023-07-10 NOTE — Group Note (Signed)
Group Topic: Communication  Group Date: 07/10/2023 Start Time: 1104 End Time: 1126 Facilitators: Merrie Roof, RN  Department: [facility]  Number of Participants: 6  Group Focus: communication Treatment Modality:  Patient-Centered Therapy Interventions utilized were group exercise Purpose: express feelings  Name: Alexandra Buckley Date of Birth: 1971-03-31  MR: [id number]    Level of Participation: active Quality of Participation: cooperative Interactions with others: gave feedback Mood/Affect: appropriate Triggers (if applicable):  na Cognition: coherent/clear Progress: Significant Response: good Plan: patient will be encouraged to continue with therapy  Patients Problems:  Patient Active Problem List   Diagnosis Date Noted   Substance induced mood disorder (HCC) 07/09/2023   History of trichomoniasis 10/08/2022   Recurrent major depressive disorder, in remission (HCC) 08/31/2022   Psychophysiological insomnia 08/31/2022   Essential hypertension 08/11/2022   Chronic left shoulder pain 08/11/2022   Chronic bilateral low back pain without sciatica 08/11/2022   Mixed hyperlipidemia 08/11/2022   History of suicide attempt 08/05/2022   Tobacco use disorder 08/05/2022   Cocaine abuse with cocaine-induced mood disorder (HCC) 08/04/2022   MDD (major depressive disorder), recurrent severe, without psychosis (HCC) 03/02/2017

## 2023-07-10 NOTE — ED Notes (Addendum)
Patient states that she has pain in her left hand . States that she has that pain a lot. Rn offered tylenol patient refused states tylenol does not help . Rn notified provider. Patient is able to move all extremities and is not having any slurred speech.Will continue to monitor for safety.

## 2023-07-10 NOTE — ED Notes (Signed)
Pt was provided dinner.

## 2023-07-10 NOTE — ED Notes (Signed)
Rn asked provider if she would ike patient on ciwa or cows

## 2023-07-10 NOTE — ED Notes (Addendum)
Patient in bedroom. Environment is secured. Will continue to monitor for safety. 

## 2023-07-10 NOTE — Group Note (Signed)
Group Topic: Relaxation  Group Date: 07/10/2023 Start Time: 1130 End Time: 1210 Facilitators: Londell Moh, NT  Department: [facility]  Number of Participants: 7  Group Focus: check in, daily focus, and relaxation Treatment Modality:  Psychoeducation Interventions utilized were leisure development and patient education Purpose: increase insight  Name: Alexandra Buckley Date of Birth: 25-Feb-1971  MR: [id number]    Level of Participation: Pt did not attend group Patients Problems:  Patient Active Problem List   Diagnosis Date Noted   Substance induced mood disorder (HCC) 07/09/2023   History of trichomoniasis 10/08/2022   Recurrent major depressive disorder, in remission (HCC) 08/31/2022   Psychophysiological insomnia 08/31/2022   Essential hypertension 08/11/2022   Chronic left shoulder pain 08/11/2022   Chronic bilateral low back pain without sciatica 08/11/2022   Mixed hyperlipidemia 08/11/2022   History of suicide attempt 08/05/2022   Tobacco use disorder 08/05/2022   Cocaine abuse with cocaine-induced mood disorder (HCC) 08/04/2022   MDD (major depressive disorder), recurrent severe, without psychosis (HCC) 03/02/2017

## 2023-07-10 NOTE — ED Provider Notes (Signed)
Behavioral Health Progress Note  Date and Time: 07/10/2023 7:14 PM Name: Alexandra Buckley MRN:  [id number]  Subjective:   Patient complained of pain in left arm and shoulder related to hx of stroke. She reported that this pain is "on and off". Reported that she usually takes Ibuprofen to ease it. Patient was reminded that she received Ibuprofen 200 mg and she stated that she usually takes 400 or 600 mg.  She reported that  this pain started after the stroke.  Ibuprofen 400 mg PO once ordered by this provider. Patient was cooperative and pleasant. Discussed about her life as homeless " I am going to be honest with you, I got tired of selling drugs, I am ready to live a normal life" Diagnosis:  Final diagnoses:  None    Total Time spent with patient:     Past Psychiatric History:  Past Medical History:  Family History:  Family Psychiatric  History:  Social History:   Additional Social History:                         Sleep:   Appetite:    Current Medications:  Current Facility-Administered Medications  Medication Dose Route Frequency Provider Last Rate Last Admin  . acetaminophen (TYLENOL) tablet 650 mg  650 mg Oral Q6H PRN Princess Bruins, DO      . alum & mag hydroxide-simeth (MAALOX/MYLANTA) 200-200-20 MG/5ML suspension 30 mL  30 mL Oral Q4H PRN Rayburn Go, Kathlean Cinco M, NP      . amLODipine (NORVASC) tablet 5 mg  5 mg Oral Daily Rayburn Go, Qusai Kem M, NP      . aspirin EC tablet 81 mg  81 mg Oral QHS Princess Bruins, DO      . atorvastatin (LIPITOR) tablet 40 mg  40 mg Oral QHS Princess Bruins, DO      . clopidogrel (PLAVIX) tablet 75 mg  75 mg Oral QHS Princess Bruins, DO      . ibuprofen (ADVIL) tablet 200 mg  200 mg Oral Q4H PRN Princess Bruins, DO   200 mg at 07/10/23 1637  . ibuprofen (ADVIL) tablet 400 mg  400 mg Oral Once Annaleigha Woo M, NP      . magnesium hydroxide (MILK OF MAGNESIA) suspension 30 mL  30 mL Oral Daily PRN Marlou Sa, NP       Current  Outpatient Medications  Medication Sig Dispense Refill  . amLODipine (NORVASC) 5 MG tablet Take 1 tablet (5 mg total) by mouth daily. (Patient not taking: Reported on 07/09/2023) 30 tablet 1  . atorvastatin (LIPITOR) 10 MG tablet Take 1 tablet (10 mg total) by mouth at bedtime. (Patient not taking: Reported on 07/09/2023) 30 tablet 1  . cyclobenzaprine (FLEXERIL) 10 MG tablet Take 1 tablet (10 mg total) by mouth 3 (three) times daily as needed for muscle spasms. (Patient not taking: Reported on 07/09/2023) 60 tablet 0  . hydrochlorothiazide (HYDRODIURIL) 25 MG tablet Take 1 tablet (25 mg total) by mouth every morning. (Patient not taking: Reported on 07/09/2023) 30 tablet 1  . hydrOXYzine (ATARAX) 25 MG tablet Take 1 tablet (25 mg total) by mouth at bedtime as needed. (Patient not taking: Reported on 07/09/2023) 30 tablet 1  . meloxicam (MOBIC) 15 MG tablet Take 1 tablet (15 mg total) by mouth daily. (Patient not taking: Reported on 07/09/2023) 30 tablet 1  . potassium chloride SA (KLOR-CON M) 20 MEQ tablet Take 1 tablet (20 mEq total) by mouth 2 (two)  times daily. (Patient taking differently: Take 20 mEq by mouth 2 (two) times daily. Take for 5 days starting on 07/08/23.) 10 tablet 0    Labs  Lab Results:  Admission on 07/08/2023, Discharged on 07/09/2023  Component Date Value Ref Range Status  . Potassium 07/09/2023 3.8  3.5 - 5.1 mmol/L Final   Performed at [facility], [address], [place], [place] [address]  Admission on 07/08/2023, Discharged on 07/08/2023  Component Date Value Ref Range Status  . Sodium 07/08/2023 142  135 - 145 mmol/L Final  . Potassium 07/08/2023 2.8 (L)  3.5 - 5.1 mmol/L Final  . Chloride 07/08/2023 106  98 - 111 mmol/L Final  . CO2 07/08/2023 24  22 - 32 mmol/L Final  . Glucose, Bld 07/08/2023 128 (H)  70 - 99 mg/dL Final   Glucose reference range applies only to samples taken after fasting for at least 8 hours.  . BUN 07/08/2023 12  6 - 20 mg/dL Final  .  Creatinine, Ser 07/08/2023 1.10 (H)  0.44 - 1.00 mg/dL Final  . Calcium 47/82/9562 9.6  8.9 - 10.3 mg/dL Final  . Total Protein 07/08/2023 7.5  6.5 - 8.1 g/dL Final  . Albumin 13/05/6577 3.9  3.5 - 5.0 g/dL Final  . AST 46/96/2952 19  15 - 41 U/L Final  . ALT 07/08/2023 22  0 - 44 U/L Final  . Alkaline Phosphatase 07/08/2023 99  38 - 126 U/L Final  . Total Bilirubin 07/08/2023 0.5  0.3 - 1.2 mg/dL Final  . GFR, Estimated 07/08/2023 60 (L)  >60 mL/min Final   Comment: (NOTE) Calculated using the CKD-EPI Creatinine Equation (2021)   . Anion gap 07/08/2023 12  5 - 15 Final   Performed at [facility], [address] [place], [place] [address]  . Alcohol, Ethyl (B) 07/08/2023 <10  <10 mg/dL Final   Comment: (NOTE) Lowest detectable limit for serum alcohol is 10 mg/dL.  For medical purposes only. Performed at [facility], [address] [place], [place] [address]   . WBC 07/08/2023 7.4  4.0 - 10.5 K/uL Final  . RBC 07/08/2023 5.22 (H)  3.87 - 5.11 MIL/uL Final  . Hemoglobin 07/08/2023 15.0  12.0 - 15.0 g/dL Final  . HCT 27/25/3664 48.6 (H)  36.0 - 46.0 % Final  . MCV 07/08/2023 93.1  80.0 - 100.0 fL Final  . MCH 07/08/2023 28.7  26.0 - 34.0 pg Final  . MCHC 07/08/2023 30.9  30.0 - 36.0 g/dL Final  . RDW 40/34/7425 14.1  11.5 - 15.5 % Final  . Platelets 07/08/2023 270  150 - 400 K/uL Final  . nRBC 07/08/2023 0.0  0.0 - 0.2 % Final   Performed at [facility], [address] [place], [place] [address]  . Opiates 07/08/2023 NONE DETECTED  NONE DETECTED Final  . Cocaine 07/08/2023 POSITIVE (A)  NONE DETECTED Final  . Benzodiazepines 07/08/2023 NONE DETECTED  NONE DETECTED Final  . Amphetamines 07/08/2023 NONE DETECTED  NONE DETECTED Final  . Tetrahydrocannabinol 07/08/2023 NONE DETECTED  NONE DETECTED Final  . Barbiturates 07/08/2023 NONE DETECTED  NONE DETECTED Final   Comment: (NOTE) DRUG SCREEN FOR MEDICAL PURPOSES ONLY.  IF CONFIRMATION IS NEEDED FOR ANY  PURPOSE, NOTIFY LAB WITHIN 5 DAYS.  LOWEST DETECTABLE LIMITS FOR URINE DRUG SCREEN Drug Class                     Cutoff (ng/mL)  Amphetamine and metabolites    1000 Barbiturate and metabolites    200 Benzodiazepine                 200 Opiates and metabolites        300 Cocaine and metabolites        300 THC                            50 Performed at [facility], [address], [place], [place] [address]     Blood Alcohol level:  Lab Results  Component Value Date   ETH <10 07/08/2023   ETH <10 08/04/2022    Metabolic Disorder Labs: Lab Results  Component Value Date   HGBA1C 5.5 08/04/2022   MPG 111.15 08/04/2022   Lab Results  Component Value Date   PROLACTIN 7.6 08/04/2022   Lab Results  Component Value Date   CHOL 182 08/04/2022   TRIG 56 08/04/2022   HDL 55 08/04/2022   CHOLHDL 3.3 08/04/2022   VLDL 11 08/04/2022   LDLCALC 116 (H) 08/04/2022    Therapeutic Lab Levels: No results found for: "LITHIUM" No results found for: "VALPROATE" No results found for: "CBMZ"  Physical Findings   AIMS    Flowsheet Row [facility]  AIMS Total Score 0      AUDIT    Flowsheet Row [facility]  Alcohol Use Disorder Identification Test Final Score (AUDIT) 2      PHQ2-9    Flowsheet Row ED from 07/08/2023 in [facility] ED from 08[facility]04/2022 in [facility]  PHQ-2 Total Score 2 4  PHQ-9 Total Score 7 10      Flowsheet Row ED from 07/09/2023 in [facility] Most recent reading at 07/10/2023 12:48 AM ED from 07/08/2023 in [facility] Most recent reading at 07/09/2023  2:09 AM ED from 07/08/2023 in [facility] Emergency Department at [facility] Most recent reading at 07/08/2023  7:09 PM  C-SSRS  RISK CATEGORY No Risk High Risk No Risk        Musculoskeletal  Strength & Muscle Tone:  Gait & Station:  Patient leans:   Psychiatric Specialty Exam  Presentation  General Appearance:  Disheveled  Eye Contact: Fair  Speech: Clear and Coherent; Normal Rate  Speech Volume: Normal  Handedness: Right   Mood and Affect  Mood: Euthymic  Affect: Appropriate; Congruent; Full Range   Thought Process  Thought Processes: Coherent; Goal Directed; Linear  Descriptions of Associations:Intact  Orientation:Full (Time, Place and Person)  Thought Content:Logical; WDL  Diagnosis of Schizophrenia or Schizoaffective disorder in past: No    Hallucinations:Hallucinations: None  Ideas of Reference:None  Suicidal Thoughts:Suicidal Thoughts: No  Homicidal Thoughts:Homicidal Thoughts: No   Sensorium  Memory: Immediate Good  Judgment: Poor  Insight: Shallow   Executive Functions  Concentration: Good  Attention Span: Good  Recall: Good  Fund of Knowledge: Good  Language: Good   Psychomotor Activity  Psychomotor Activity: Psychomotor Activity: Normal   Assets  Assets: Communication Skills; Desire for Improvement; Resilience   Sleep  Sleep: Sleep: Fair   Nutritional Assessment (For OBS and [facility] admissions only) Has the patient had a weight loss or gain of 10 pounds or more in the last 3 months?: No Does the patient  have eating habits or behaviors that may be indicators of an eating disorder including binging or inducing vomiting?: No Has the patient recently lost weight without trying?: 0 Has the patient been eating poorly because of a decreased appetite?: 0 Malnutrition Screening Tool Score: 0    Physical Exam  Physical Exam Musculoskeletal:        General: Normal range of motion.     Cervical back: Normal range of motion.  Neurological:     General: No focal deficit present.     Mental Status: She is alert and oriented to person,  place, and time.  Review of Systems  Constitutional: Negative.   HENT: Negative.    Eyes: Negative.   Cardiovascular:        HTN  Gastrointestinal: Negative.   Genitourinary: Negative.   Musculoskeletal: Negative.   Skin: Negative.   Neurological: Negative.   Endo/Heme/Allergies: Negative.   Psychiatric/Behavioral:  Positive for depression and substance abuse.   Blood pressure (!) 144/59, pulse 74, temperature 98.6 F (37 C), temperature source Oral, resp. rate 17, last menstrual period 11/18/2018, SpO2 100%. There is no height or weight on file to calculate BMI.  Treatment Plan Summary: Daily contact with patient to assess and evaluate symptoms and progress in treatment  Olin Pia, NP 07/10/2023 7:14 PM

## 2023-07-10 NOTE — ED Notes (Signed)
Patient states that her back pain feel better provider is present

## 2023-07-10 NOTE — ED Notes (Signed)
Rn notified provider of patient abdomial pain and blood pressure of 144/59. Provider came to assess patient states that will give her Norvasc for bp and medication for abdominal pain

## 2023-07-10 NOTE — ED Notes (Signed)
Provider states that patient does not ned cows or ciwa

## 2023-07-10 NOTE — ED Notes (Signed)
Patient did not request medication once order was enter.

## 2023-07-10 NOTE — ED Notes (Signed)
 Pt was provided lunch

## 2023-07-10 NOTE — ED Notes (Signed)
Patient  sleeping in no acute stress. RR even and unlabored .Environment secured .Will continue to monitor for safely. 

## 2023-07-10 NOTE — ED Provider Notes (Cosign Needed Addendum)
Facility Based Crisis Admission H&P  Date: 07/10/23 Patient Name: Alexandra Buckley MRN: [id number] Chief Complaint: cocaine cessation tx  Diagnoses:  Final diagnoses:  None    HPI: Alexandra Buckley is a 52 y.o. female with PMH of cocaine use d/o, substance induced mood d/o, tobacco use d/o, suicide attempt x2 (last time 08/2022), inpatient psych admission, CVA (05/2023, see [name] [id number]) who presented to [facility] (07/09/2023) then admitted to [facility] for treatment of cocaine use - residential rehab.  Reported feeling good althought tired. No depression or anxiety. Sleep and appetite are improving.  Denied craving. Endorsed forearm pain with wrist extension. Endorsed h/o stroke 2 months ago, not in chart because she used her daughter's ID because she thought daughter has insurance. See below for copy of DC Summary from hospitalization for stroke.  Denied active and passive SI, HI, AVH, paranoia.   " - copied from [facility] H&P 07/08/2023 - Alexandra Buckley, Alexandra V, NP  Alexandra Buckley is a 52 year old female with psychiatric history of MDD, Polysubstance abuse, cocaine abuse with cocaine induced mood disorder, suicide attempt, and Insomnia who presented voluntarily as a walk-in to walk-in to [facility] endorsing passive SI and requesting detox from crack cocaine.   Patient was seen face-to-face by this provider and chart reviewed. Per chart review, patient presented to [facility] tonight requesting detox for alcoholism and drug addiction (Heroine/Crack).    Per EDP note "She states that her last alcohol use was the day before yesterday. She states that last cocaine use was yesterday. In the past when she has stopped drinking, she denies hallucinations or seizures but did get shaky.  Lab work up and Imaging completed at the ED with interventions noted. Patient was encouraged her to follow-up at behavioral health urgent care if her symptoms worsen.     Patient is currently presenting with complaints of  passive SI, no plans, reports it started yesterday and she is unable to contract for safety. Patient is also requesting detox from crack cocaine. She denies other illicit substance use. UDS positive for cocaine. BAL WNL.  Patient reports she smokes crack daily and last used yesterday.  Patient reports she has been using crack cocaine off/on for about 20 years.    Patient reports she is currently homeless "for a while or less than a year".   Patient endorses a history of substance abuse treatment twice.  Patient reports she plans to go to [facility] after detox here.   Patient endorses a history of suicide attempt in 2018 where "I mixed a bunch of cleaning supplies and that was the second time".   Support, encouragement, and reassurance provided about ongoing stressors.     Discussed admission to the continuous observation unit for safety monitoring overnight and reevaluate in the a.m. for SI.  Patient may be appropriate for the [facility].  Patient is provided with opportunity for questions. Patient verbalized her understanding and is in agreement.  "  " [name] NFA:[id number] DOB: 11/28/1991 DOA: 05/19/2023   PCP: Pcp, No   Admit date: 05/19/2023 Discharge date: 05/21/2023 Admitted From: From street Disposition: Home with mother Recommendations for Outpatient Follow-up:  Outpatient follow-up with PCP and neurology as above Reassess BP, CMP and CBC at follow-up Please follow up on the following pending results: None   Home Health: HH PT Equipment/Devices: None   Discharge Condition: Stable CODE STATUS: Full code   Follow-up Information       [facility] Follow up on 06/08/2023.  Specialty: Family Medicine Why: Your appointment is at 10:00 am. Please arrive early and bring picture ID, insurance card and current medications. Contact information: [address] [place] [id number] [phone]            [name]. Schedule an appointment as soon as possible for a visit in 1 month(s).   Specialty: Neurology Why: stroke clinic Contact information: [address] [place] [address] [phone]                      Hospital course 52 year old F with PMH of cocaine and tobacco use disorder brought to [facility] ED by EMS after found sleeping on driver side by bystander who called EMS.  Patient woke up after 2 rounds of Narcan by EMS.  She admitted to snorting cocaine.  In ED, slightly hypertensive.  Basic labs without significant finding.  CT head showed some abnormality so MRI of brain was done which showed evidence of acute CVA in left putamen and multiple old encephalomalacia suggesting chronic infarct versus multiple sclerosis.  Neurology consulted and recommended transfer to [facility] hospital for further CVA workup.   CT angio head and neck without acute finding.  LDL 110.  A1c 6.1%.  TTE without significant finding.  UDS positive for cocaine.  EEG negative for seizure or epileptiform discharge.  Neurology recommended aspirin and Plavix for 3 weeks followed by aspirin alone, and aggressive risk reduction.  She has been started on amlodipine for hypertension.  PT recommends acute skilled PT to increase independence and safety with mobility to allow discharge.  HH PT ordered.  Patient planning to go to her mother's home after discharge.   See individual problem list below for more.    Problems addressed during this hospitalization Principal Problem:   Acute CVA (cerebrovascular accident) [facility]) Active Problems:   Cocaine use   Tobacco use disorder   Acute CVA: Found down on street by bystander who called EMS.  No focal neurodeficit breath some weakness unsteadiness.  CT head showed some abnormality so MRI of brain was done which showed evidence of acute CVA in left putamen and multiple old encephalomalacia suggesting chronic infarct versus multiple sclerosis.  UDS  positive for cocaine.  LDL 110.  A1c 6.1%.  TTE without significant finding.  EEG negative. -Discharged on Plavix and aspirin for 3 weeks followed by aspirin alone per neurology recommendation -Continue Lipitor and amlodipine on discharge -Encouraged to refrain from cocaine use and smoking cigarettes. -Outpatient follow-up with PCP and neurology as above.   Essential hypertension: BP elevated. -Started amlodipine -Reassess and adjust as appropriate at follow-up.   Tobacco use disorder: Reports smoking about half a pack a day. -Encouraged cessation -Nicotine patch   Cocaine use: Admits to snorting cocaine.  Denies IVDU.  UDS positive for cocaine -Counseled to refrain from cocaine.  Patient seems to be precontemplative.  When we discussed about risk of cocaine use, she states "it can happen even if you do not use" -Provided with resources by [facility] as well.   Compliance -Counseled on the importance of compliance with evaluation   Disposition: Patient [place] living in [place].  She says she will  stay with his mother who be back from [place] today.  acetaminophen 325 MG tablet Commonly known as: TYLENOL Take 2 tablets (650 mg total) by mouth every 6 (six) hours as needed for mild pain or headache (or Fever >/= 101).    amLODipine  10 MG tablet Commonly known as: NORVASC Take 1 tablet (10 mg total) by mouth daily. Start taking on: May 22, 2023    aspirin EC 81 MG tablet Take 1 tablet (81 mg total) by mouth daily. Swallow whole.    atorvastatin 40 MG tablet Commonly known as: LIPITOR Take 1 tablet (40 mg total) by mouth daily.    clopidogrel 75 MG tablet Commonly known as: PLAVIX Take 1 tablet (75 mg total) by mouth daily for 21 days.    nicotine 14 mg/24hr patch Commonly known as: NICODERM CQ - dosed in mg/24 hours Place 1 patch (14 mg total) onto the skin daily as needed (denied before but available if changes mind).  "  Patient amenable to med changes per below after  discussing the risks, benefits, and side effects. Otherwise patient had no other questions or concerns and was amenable to plan per above.  Review of Systems  Constitutional:  Positive for malaise/fatigue.  Respiratory:  Negative for shortness of breath.   Cardiovascular:  Negative for chest pain.  Gastrointestinal:  Negative for abdominal pain, nausea and vomiting.  Musculoskeletal:  Positive for myalgias.  Neurological:  Negative for dizziness, tremors, weakness and headaches.     PHQ 2-9:  Flowsheet Row ED from 07/08/2023 in [facility] ED from 08/04/2022 in [facility]  Thoughts that you would be better off dead, or of hurting yourself in some way Not at all More than half the days  PHQ-9 Total Score 7 10       Flowsheet Row ED from 07/09/2023 in [facility] Most recent reading at 07/10/2023 12:48 AM ED from 07/08/2023 in [facility] Most recent reading at 07/09/2023  2:09 AM ED from 07/08/2023 in [facility] Emergency Department at [facility] Most recent reading at 07/08/2023  7:09 PM  C-SSRS RISK CATEGORY No Risk High Risk No Risk       Total Time spent with patient: 1 hour  Past Psychiatric History:  Diagnoses: cocaine use d/o, substance induced mood d/o, tobacco use d/o, suicide attempt x2 (last time 08/2022), inpatient psych admission Medication trials: doesn't remember Hospitalizations: yes Suicide attempts: x2 - 08/2022, 2018 - ingestion of cleaning products  Substance Use History: Nicotine:  reports that she has quit smoking. Her smoking use included cigarettes. She has never used smokeless tobacco. IV drug use: Denied Stimulants: Cocaine DT: Denied Detox: Yes Residential: Yes  Past Medical History: Dx:  has a past medical history of History of suicide attempt (08/05/2022) and Tobacco use disorder (08/05/2022).  Allergies: Patient has no known  allergies.   Family Psychiatric History:    Social History:  Housing: housing instability Income: unemployed Family: daughter, grandchildren   Musculoskeletal  Strength & Muscle Tone: within normal limits Gait & Station: normal Patient leans: N/A  Psychiatric Specialty Exam  Presentation General Appearance:  Disheveled  Eye Contact: Fair  Speech: Clear and Coherent; Normal Rate  Speech Volume: Normal  Handedness: Right   Mood and Affect  Mood: Euthymic  Affect: Appropriate; Congruent; Full Range   Thought Process  Thought Processes: Coherent; Goal Directed; Linear  Descriptions of Associations:Intact  Orientation:Full (Time, Place and Person)  Thought Content:Logical; WDL  Diagnosis of Schizophrenia or Schizoaffective disorder in past: No    Hallucinations:Hallucinations: None  Ideas of Reference:None  Suicidal Thoughts:Suicidal Thoughts: No  Homicidal Thoughts:Homicidal Thoughts: No   Sensorium  Memory: Immediate Good  Judgment: Poor  Insight: Shallow  Executive Functions  Concentration: Good  Attention Span: Good  Recall: Good  Fund of Knowledge: Good  Language: Good   Psychomotor Activity  Psychomotor Activity: Psychomotor Activity: Normal   Assets  Assets: Communication Skills; Desire for Improvement; Resilience   Sleep  Sleep: Sleep: Fair   Nutritional Assessment (For OBS and FBC admissions only) Has the patient had a weight loss or gain of 10 pounds or more in the last 3 months?: No Does the patient have eating habits or behaviors that may be indicators of an eating disorder including binging or inducing vomiting?: No Has the patient recently lost weight without trying?: 0 Has the patient been eating poorly because of a decreased appetite?: 0 Malnutrition Screening Tool Score: 0    Physical Exam Vitals and nursing note reviewed.  Constitutional:      General: She is not in acute distress.     Appearance: She is not ill-appearing, toxic-appearing or diaphoretic.  HENT:     Head: Normocephalic and atraumatic.  Pulmonary:     Effort: Pulmonary effort is normal. No respiratory distress.  Musculoskeletal:        General: Tenderness (left forearm on palpation and wrist extension) present.  Neurological:     General: No focal deficit present.     Mental Status: She is alert and oriented to person, place, and time.     Blood pressure 114/61, pulse 64, temperature 98.3 F (36.8 C), temperature source Oral, resp. rate 16, last menstrual period 11/18/2018, SpO2 100%. There is no height or weight on file to calculate BMI.  Past Psychiatric History: See above   Is the patient at risk to self? No  Has the patient been a risk to self in the past 6 months? No .    Has the patient been a risk to self within the distant past? Yes   Is the patient a risk to others? No   Has the patient been a risk to others in the past 6 months? No   Has the patient been a risk to others within the distant past? No   Past Medical History: See above Family History: See above Social History: See above  Last Labs:  Admission on 07/08/2023, Discharged on 07/09/2023  Component Date Value Ref Range Status  . Potassium 07/09/2023 3.8  3.5 - 5.1 mmol/L Final   Performed at [facility], [address] [place], [place] [address]  Admission on 07/08/2023, Discharged on 07/08/2023  Component Date Value Ref Range Status  . Sodium 07/08/2023 142  135 - 145 mmol/L Final  . Potassium 07/08/2023 2.8 (L)  3.5 - 5.1 mmol/L Final  . Chloride 07/08/2023 106  98 - 111 mmol/L Final  . CO2 07/08/2023 24  22 - 32 mmol/L Final  . Glucose, Bld 07/08/2023 128 (H)  70 - 99 mg/dL Final   Glucose reference range applies only to samples taken after fasting for at least 8 hours.  . BUN 07/08/2023 12  6 - 20 mg/dL Final  . Creatinine, Ser 07/08/2023 1.10 (H)  0.44 - 1.00 mg/dL Final  . Calcium 08/65/7846 9.6  8.9 - 10.3  mg/dL Final  . Total Protein 07/08/2023 7.5  6.5 - 8.1 g/dL Final  . Albumin 96/29/5284 3.9  3.5 - 5.0 g/dL Final  . AST 13/24/4010 19  15 - 41 U/L Final  . ALT 07/08/2023 22  0 - 44 U/L Final  . Alkaline Phosphatase 07/08/2023 99  38 - 126 U/L Final  .  Total Bilirubin 07/08/2023 0.5  0.3 - 1.2 mg/dL Final  . GFR, Estimated 07/08/2023 60 (L)  >60 mL/min Final   Comment: (NOTE) Calculated using the CKD-EPI Creatinine Equation (2021)   . Anion gap 07/08/2023 12  5 - 15 Final   Performed at [facility], [address] [place], [place] [address]  . Alcohol, Ethyl (B) 07/08/2023 <10  <10 mg/dL Final   Comment: (NOTE) Lowest detectable limit for serum alcohol is 10 mg/dL.  For medical purposes only. Performed at [facility], [address], [place], [place] [address]   . WBC 07/08/2023 7.4  4.0 - 10.5 K/uL Final  . RBC 07/08/2023 5.22 (H)  3.87 - 5.11 MIL/uL Final  . Hemoglobin 07/08/2023 15.0  12.0 - 15.0 g/dL Final  . HCT 78/29/5621 48.6 (H)  36.0 - 46.0 % Final  . MCV 07/08/2023 93.1  80.0 - 100.0 fL Final  . MCH 07/08/2023 28.7  26.0 - 34.0 pg Final  . MCHC 07/08/2023 30.9  30.0 - 36.0 g/dL Final  . RDW 30/86/5784 14.1  11.5 - 15.5 % Final  . Platelets 07/08/2023 270  150 - 400 K/uL Final  . nRBC 07/08/2023 0.0  0.0 - 0.2 % Final   Performed at [facility], [address] [place], [place] [address]  . Opiates 07/08/2023 NONE DETECTED  NONE DETECTED Final  . Cocaine 07/08/2023 POSITIVE (A)  NONE DETECTED Final  . Benzodiazepines 07/08/2023 NONE DETECTED  NONE DETECTED Final  . Amphetamines 07/08/2023 NONE DETECTED  NONE DETECTED Final  . Tetrahydrocannabinol 07/08/2023 NONE DETECTED  NONE DETECTED Final  . Barbiturates 07/08/2023 NONE DETECTED  NONE DETECTED Final   Comment: (NOTE) DRUG SCREEN FOR MEDICAL PURPOSES ONLY.  IF CONFIRMATION IS NEEDED FOR ANY PURPOSE, NOTIFY LAB WITHIN 5 DAYS.  LOWEST DETECTABLE LIMITS FOR URINE DRUG SCREEN Drug Class                      Cutoff (ng/mL) Amphetamine and metabolites    1000 Barbiturate and metabolites    200 Benzodiazepine                 200 Opiates and metabolites        300 Cocaine and metabolites        300 THC                            50 Performed at [facility], [address], [place], [place] [address]     Allergies: Patient has no known allergies.  Medications:  Facility Ordered Medications  Medication  . acetaminophen (TYLENOL) tablet 650 mg  . alum & mag hydroxide-simeth (MAALOX/MYLANTA) 200-200-20 MG/5ML suspension 30 mL  . magnesium hydroxide (MILK OF MAGNESIA) suspension 30 mL  . ibuprofen (ADVIL) tablet 200 mg  . aspirin EC tablet 81 mg  . clopidogrel (PLAVIX) tablet 75 mg  . atorvastatin (LIPITOR) tablet 40 mg   PTA Medications  Medication Sig  . atorvastatin (LIPITOR) 10 MG tablet Take 1 tablet (10 mg total) by mouth at bedtime. (Patient not taking: Reported on 07/09/2023)  . meloxicam (MOBIC) 15 MG tablet Take 1 tablet (15 mg total) by mouth daily. (Patient not taking: Reported on 07/09/2023)  . amLODipine (NORVASC) 5 MG tablet Take 1 tablet (5 mg total) by mouth daily. (Patient not taking: Reported on 07/09/2023)  . hydrochlorothiazide (HYDRODIURIL) 25 MG tablet Take 1 tablet (  25 mg total) by mouth every morning. (Patient not taking: Reported on 07/09/2023)  . hydrOXYzine (ATARAX) 25 MG tablet Take 1 tablet (25 mg total) by mouth at bedtime as needed. (Patient not taking: Reported on 07/09/2023)  . cyclobenzaprine (FLEXERIL) 10 MG tablet Take 1 tablet (10 mg total) by mouth 3 (three) times daily as needed for muscle spasms. (Patient not taking: Reported on 07/09/2023)  . potassium chloride SA (KLOR-CON M) 20 MEQ tablet Take 1 tablet (20 mEq total) by mouth 2 (two) times daily. (Patient taking differently: Take 20 mEq by mouth 2 (two) times daily. Take for 5 days starting on 07/08/23.)    Long Term Goals: Improvement in symptoms so as ready for discharge   Short  Term Goals: Patient will verbalize feelings in meetings with treatment team members., Patient will attend at least of 50% of the groups daily., Pt will complete the PHQ9 on admission, day 3 and discharge., Patient will participate in completing the [place] Suicide Severity Rating Scale, Patient will score a low risk of violence for 24 hours prior to discharge, and Patient will take medications as prescribed daily.  Medical Decision Making  Dx: cocaine use d/o, substance induced mood d/o, tobacco use d/o, suicide attempt x2 (last time 08/2022), inpatient psych admission   Clinical Course as of 07/11/23 4540  [facility] Jul 11, 2023  0902 Hemoglobin A1C(!): 5.8 [JN]    Clinical Course User Index [JN] Princess Bruins, DO    Recommendations  Based on my evaluation the patient does not appear to have an emergency medical condition.  Cocaine use d/o  SIMD  Improved, currently euthymic, sleep and appetite are good, no SI.  Encouraged maintaining abstinence Comfort PRN per below Amenable for residential rehab  CVA (05/2023) See H&P 07/10/2023 RESTARTED home asa 81 mg at bedtime  RESTARTED home lipitor 40 mg at bedtime RESTARTED home plavix 75 mg at bedtime x3 weeks, then DC per neuro  Nicotine use d/o Declined NRTs   PRNs: acetaminophen, 650 mg, Q6H PRN alum & mag hydroxide-simeth, 30 mL, Q4H PRN ibuprofen, 200 mg, Q4H PRN magnesium hydroxide, 30 mL, Daily PRN     Princess Bruins, DO Psych Resident, PGY-3 07/10/23  5:26 PM

## 2023-07-10 NOTE — ED Notes (Signed)
Patient alert and oriented x 3. Denies SI/HI/AVH. Denies intent or plan to harm self or others. Routine conducted according to faculty protocol. Encourage patient to notify staff with any needs or concerns. Patient verbalized agreement and understanding. Will continue to monitor for safety. 

## 2023-07-10 NOTE — ED Notes (Signed)
 Patient in the bedroom sleeping. NAD.  Respirations are even and unlabored. Will continue to monitor for safety.

## 2023-07-10 NOTE — ED Notes (Signed)
Pt was provided breakfast.

## 2023-07-10 NOTE — ED Notes (Signed)
VAL would like to be woken up for breakfast in the AM told her I would pass it along to first shift to make sure she was up.

## 2023-07-10 NOTE — ED Notes (Signed)
Patient in the Dayroom watching TV with other patients. Denies SI/HI &AVH. NAD. Respirations are even and unlabored. Will continue to monitor for safety.

## 2023-07-10 NOTE — ED Notes (Signed)
Patient requested medication for back pain

## 2023-07-10 NOTE — ED Notes (Signed)
Notified provider that patient does not have any prn medication for aniexty,patient is not asking for any at this time. Also notified provider that all of her medication was discontinued when she transferred over here unsure if she is suppose to continue on any of the medication.

## 2023-07-10 NOTE — Group Note (Signed)
Group Topic: Relapse and Recovery  Group Date: 07/10/2023 Start Time: 2000 End Time: 2100 Facilitators: Rae Lips B  Department: [facility]  Number of Participants: 6  Group Focus: activities of daily living skills, chemical dependency education, and chemical dependency issues Treatment Modality:  Leisure Development and Spiritual Interventions utilized were problem solving, reality testing, story telling, and support Purpose: express feelings  Name: Alexandra Buckley Date of Birth: 1971/08/17  MR: [id number]    Level of Participation: PT DID NOT ATTEND GROUP Quality of Participation: withdrawn Interactions with others: gave feedback Mood/Affect: appropriate Triggers (if applicable): NA Cognition: coherent/clear Progress: None Response: NA Plan: patient will be encouraged to go to groups  Patients Problems:  Patient Active Problem List   Diagnosis Date Noted   Substance induced mood disorder (HCC) 07/09/2023   History of trichomoniasis 10/08/2022   Recurrent major depressive disorder, in remission (HCC) 08/31/2022   Psychophysiological insomnia 08/31/2022   Essential hypertension 08/11/2022   Chronic left shoulder pain 08/11/2022   Chronic bilateral low back pain without sciatica 08/11/2022   Mixed hyperlipidemia 08/11/2022   History of suicide attempt 08/05/2022   Tobacco use disorder 08/05/2022   Cocaine abuse with cocaine-induced mood disorder (HCC) 08/04/2022   MDD (major depressive disorder), recurrent severe, without psychosis (HCC) 03/02/2017

## 2023-07-10 NOTE — ED Notes (Signed)
Patient is in her room.  Environment is secured. Will continue to monitor for safety.

## 2023-07-11 DIAGNOSIS — Z8673 Personal history of transient ischemic attack (TIA), and cerebral infarction without residual deficits: Secondary | ICD-10-CM

## 2023-07-11 DIAGNOSIS — F331 Major depressive disorder, recurrent, moderate: Secondary | ICD-10-CM | POA: Diagnosis not present

## 2023-07-11 DIAGNOSIS — F1414 Cocaine abuse with cocaine-induced mood disorder: Secondary | ICD-10-CM | POA: Diagnosis not present

## 2023-07-11 DIAGNOSIS — F1994 Other psychoactive substance use, unspecified with psychoactive substance-induced mood disorder: Secondary | ICD-10-CM | POA: Diagnosis not present

## 2023-07-11 DIAGNOSIS — I10 Essential (primary) hypertension: Secondary | ICD-10-CM | POA: Diagnosis not present

## 2023-07-11 LAB — HEMOGLOBIN A1C
Hgb A1c MFr Bld: 5.8 % — ABNORMAL HIGH (ref 4.8–5.6)
Mean Plasma Glucose: 119.76 mg/dL

## 2023-07-11 NOTE — ED Notes (Signed)
Pt is in his room resting in bed. Pt denies SI/HI/AVH. No acute distress noted. Will continue to monitor for safety.

## 2023-07-11 NOTE — ED Notes (Signed)
Patient got into an argument with another female patient over the fruit cups. Alexandra Buckley did not like that the other patient was touching all the fruit cups, instead of just getting one, and she said something about it, which resulted in an argument between the two women. Alexandra Buckley called her nasty for picking up all the cups and touching them, she did not want to eat them with her hands all over them, even though they were sealed and not opened. The other patient insisted she was not nasty and did nothing wrong.

## 2023-07-11 NOTE — ED Notes (Signed)
Patient observed resting quietly, eyes closed. Respirations equal and unlabored. Will continue to monitor for safety.  

## 2023-07-11 NOTE — ED Notes (Signed)
 Patient in the bedroom sleeping. NAD.  Respirations are even and unlabored. Will continue to monitor for safety.

## 2023-07-11 NOTE — Group Note (Signed)
Group Topic: Overcoming Obstacles  Group Date: 07/11/2023 Start Time: 1000 End Time: 1100 Facilitators: Ninfa Linden, NT+3 MHT 2  Department: [facility]  Number of Participants: 5  Group Focus: coping skills Treatment Modality:  Behavior Modification Therapy Interventions utilized were problem solving Purpose: trigger / craving management  Name: Alexandra Buckley Date of Birth: 1971-09-16  MR: [id number]    Level of Participation: Patient did not attend group. Quality of Participation: N/A Interactions with others: N/A Mood/Affect: N/A Triggers (if applicable): N/A Cognition: N/A Progress: N/A Response: N/A Plan: N/A  Patients Problems:  Patient Active Problem List   Diagnosis Date Noted   Substance induced mood disorder (HCC) 07/09/2023   History of trichomoniasis 10/08/2022   Recurrent major depressive disorder, in remission (HCC) 08/31/2022   Psychophysiological insomnia 08/31/2022   Essential hypertension 08/11/2022   Chronic left shoulder pain 08/11/2022   Chronic bilateral low back pain without sciatica 08/11/2022   Mixed hyperlipidemia 08/11/2022   History of suicide attempt 08/05/2022   Tobacco use disorder 08/05/2022   Cocaine abuse with cocaine-induced mood disorder (HCC) 08/04/2022   MDD (major depressive disorder), recurrent severe, without psychosis (HCC) 03/02/2017

## 2023-07-11 NOTE — Group Note (Signed)
Group Topic: Communication  Group Date: 07/11/2023 Start Time: 2000 End Time: 2100 Facilitators: Rae Lips B  Department: [facility]  Number of Participants: 5  Group Focus: acceptance, activities of daily living skills, chemical dependency education, chemical dependency issues, clarity of thought, co-dependency, communication, community group, and coping skills Treatment Modality:  Leisure Development Interventions utilized were leisure development Purpose: express feelings  Name: Alexandra Buckley Date of Birth: 17-Dec-1970  MR: [id number]    Level of Participation: PT DID NOT ATTEND GROUP Quality of Participation: cooperative Interactions with others: gave feedback Mood/Affect: appropriate Triggers (if applicable): NA Cognition: coherent/clear and not focused Progress: None Response: NA Plan: patient will be encouraged to go to group.   Patients Problems:  Patient Active Problem List   Diagnosis Date Noted   H/O: CVA (cerebrovascular accident) 07/11/2023   Substance induced mood disorder (HCC) 07/09/2023   History of trichomoniasis 10/08/2022   Recurrent major depressive disorder, in remission (HCC) 08/31/2022   Psychophysiological insomnia 08/31/2022   Essential hypertension 08/11/2022   Chronic left shoulder pain 08/11/2022   Chronic bilateral low back pain without sciatica 08/11/2022   Mixed hyperlipidemia 08/11/2022   History of suicide attempt 08/05/2022   Tobacco use disorder 08/05/2022   Cocaine abuse with cocaine-induced mood disorder (HCC) 08/04/2022   MDD (major depressive disorder), recurrent severe, without psychosis (HCC) 03/02/2017

## 2023-07-11 NOTE — ED Notes (Addendum)
Pt is in the bed sleeping. Respirations are even and unlabored. No acute distress noted. Will continue to monitor for safety. 

## 2023-07-11 NOTE — ED Notes (Signed)
Patient A&Ox4. She denies intent to harm herself or others. Denies SI or A/VH. Patient endorses back pain which is chronic. Medication provided. No acute distress observed. Routine safety checks conducted according to facility protocol. Patient agreed to notify staff if thoughts of harm toward self or others arise. Patient rates her current mood 5/10. She states that her intention for today is "Staying out the way. I have learned that being by yourself is way better than being around them." No additional needs at this time. Will continue to monitor for safety.Will continue to monitor for safety.

## 2023-07-11 NOTE — ED Provider Notes (Signed)
Behavioral Health Progress Note  Date and Time: 07/11/2023 3:10 PM Name: Alexandra Buckley MRN:  [id number]  Subjective:  Alexandra Buckley is a 52 y.o. female, with PMH of cocaine use d/o, substance induced mood d/o, tobacco use d/o, suicide attempt x2 (last time 08/2022), inpatient psych admission, CVA (05/2023, see Alexandra Buckley [id number]) who presented to [facility] (07/09/2023) then admitted to [facility] for treatment of cocaine use - residential rehab.    Reported feeling good, denied depression or anxiety. Denied craving. Energy is improving. Appetite good. Sleep is good. Only concern is forearm msk pain, relieved with motrin. Discussed recommendations from neuro in the dc summary. She has no other questions or concerns. Denied active and passive SI, HI, AVH, paranoia.   Review of Systems  Constitutional:  Positive for malaise/fatigue. Negative for diaphoresis and weight loss.  Respiratory:  Negative for shortness of breath.   Cardiovascular:  Negative for chest pain.  Gastrointestinal:  Negative for abdominal pain, constipation, diarrhea, nausea and vomiting.  Musculoskeletal:  Positive for back pain and myalgias (left forearm on wrist extension).  Neurological:  Negative for dizziness and headaches.      Diagnosis:  Final diagnoses:  Cocaine abuse with cocaine-induced mood disorder (HCC)  History of suicide attempt  Tobacco use disorder  Mixed hyperlipidemia  H/O: CVA (cerebrovascular accident)    Past Psychiatric History: See H&P Past Medical History: See H&P Family History: See H&P Family Psychiatric  History: See H&P Social History: See H&P  Total Time spent with patient: 30 minutes  Additional Social History:                         Current Medications:  Current Facility-Administered Medications  Medication Dose Route Frequency Provider Last Rate Last Admin   acetaminophen (TYLENOL) tablet 650 mg  650 mg Oral Q6H PRN Princess Bruins, DO   650 mg at 07/11/23 0948    alum & mag hydroxide-simeth (MAALOX/MYLANTA) 200-200-20 MG/5ML suspension 30 mL  30 mL Oral Q4H PRN Rayburn Go, Veronique M, NP       amLODipine (NORVASC) tablet 5 mg  5 mg Oral Daily Rayburn Go, Veronique M, NP   5 mg at 07/11/23 5784   aspirin EC tablet 81 mg  81 mg Oral QHS Princess Bruins, DO   81 mg at 07/10/23 2148   atorvastatin (LIPITOR) tablet 40 mg  40 mg Oral QHS Princess Bruins, DO   40 mg at 07/10/23 2148   clopidogrel (PLAVIX) tablet 75 mg  75 mg Oral QHS Princess Bruins, DO   75 mg at 07/10/23 2148   ibuprofen (ADVIL) tablet 200 mg  200 mg Oral Q4H PRN Princess Bruins, DO   200 mg at 07/10/23 1637   magnesium hydroxide (MILK OF MAGNESIA) suspension 30 mL  30 mL Oral Daily PRN Marlou Sa, NP       Current Outpatient Medications  Medication Sig Dispense Refill   amLODipine (NORVASC) 5 MG tablet Take 1 tablet (5 mg total) by mouth daily. (Patient not taking: Reported on 07/09/2023) 30 tablet 1   atorvastatin (LIPITOR) 10 MG tablet Take 1 tablet (10 mg total) by mouth at bedtime. (Patient not taking: Reported on 07/09/2023) 30 tablet 1   cyclobenzaprine (FLEXERIL) 10 MG tablet Take 1 tablet (10 mg total) by mouth 3 (three) times daily as needed for muscle spasms. (Patient not taking: Reported on 07/09/2023) 60 tablet 0   hydrochlorothiazide (HYDRODIURIL) 25 MG tablet Take 1 tablet (25 mg total) by mouth  every morning. (Patient not taking: Reported on 07/09/2023) 30 tablet 1   hydrOXYzine (ATARAX) 25 MG tablet Take 1 tablet (25 mg total) by mouth at bedtime as needed. (Patient not taking: Reported on 07/09/2023) 30 tablet 1   meloxicam (MOBIC) 15 MG tablet Take 1 tablet (15 mg total) by mouth daily. (Patient not taking: Reported on 07/09/2023) 30 tablet 1   potassium chloride SA (KLOR-CON M) 20 MEQ tablet Take 1 tablet (20 mEq total) by mouth 2 (two) times daily. (Patient taking differently: Take 20 mEq by mouth 2 (two) times daily. Take for 5 days starting on 07/08/23.) 10 tablet 0    Labs   Lab Results:  Admission on 07/09/2023  Component Date Value Ref Range Status   Hgb A1c MFr Bld 07/11/2023 5.8 (H)  4.8 - 5.6 % Final   Comment: (NOTE) Pre diabetes:          5.7%-6.4%  Diabetes:              >6.4%  Glycemic control for   <7.0% adults with diabetes    Mean Plasma Glucose 07/11/2023 119.76  mg/dL Final   Performed at [facility], [address], [place], [place] [address]  Admission on 07/08/2023, Discharged on 07/09/2023  Component Date Value Ref Range Status   Potassium 07/09/2023 3.8  3.5 - 5.1 mmol/L Final   Performed at [facility], [address], [place], [place] [address]  Admission on 07/08/2023, Discharged on 07/08/2023  Component Date Value Ref Range Status   Sodium 07/08/2023 142  135 - 145 mmol/L Final   Potassium 07/08/2023 2.8 (L)  3.5 - 5.1 mmol/L Final   Chloride 07/08/2023 106  98 - 111 mmol/L Final   CO2 07/08/2023 24  22 - 32 mmol/L Final   Glucose, Bld 07/08/2023 128 (H)  70 - 99 mg/dL Final   Glucose reference range applies only to samples taken after fasting for at least 8 hours.   BUN 07/08/2023 12  6 - 20 mg/dL Final   Creatinine, Ser 07/08/2023 1.10 (H)  0.44 - 1.00 mg/dL Final   Calcium 69/62/9528 9.6  8.9 - 10.3 mg/dL Final   Total Protein 41/32/4401 7.5  6.5 - 8.1 g/dL Final   Albumin 02/72/5366 3.9  3.5 - 5.0 g/dL Final   AST 44/12/4740 19  15 - 41 U/L Final   ALT 07/08/2023 22  0 - 44 U/L Final   Alkaline Phosphatase 07/08/2023 99  38 - 126 U/L Final   Total Bilirubin 07/08/2023 0.5  0.3 - 1.2 mg/dL Final   GFR, Estimated 07/08/2023 60 (L)  >60 mL/min Final   Comment: (NOTE) Calculated using the CKD-EPI Creatinine Equation (2021)    Anion gap 07/08/2023 12  5 - 15 Final   Performed at [facility], [address], [place], [place] [address]   Alcohol, Ethyl (B) 07/08/2023 <10  <10 mg/dL Final   Comment: (NOTE) Lowest detectable limit for serum alcohol is 10 mg/dL.  For medical purposes only. Performed at  [facility], [address], [place], [place] [address]    WBC 07/08/2023 7.4  4.0 - 10.5 K/uL Final   RBC 07/08/2023 5.22 (H)  3.87 - 5.11 MIL/uL Final   Hemoglobin 07/08/2023 15.0  12.0 - 15.0 g/dL Final   HCT 33/29/5188 48.6 (H)  36.0 - 46.0 % Final   MCV 07/08/2023 93.1  80.0 - 100.0 fL Final   MCH 07/08/2023 28.7  26.0 -  34.0 pg Final   MCHC 07/08/2023 30.9  30.0 - 36.0 g/dL Final   RDW 16/07/9603 14.1  11.5 - 15.5 % Final   Platelets 07/08/2023 270  150 - 400 K/uL Final   nRBC 07/08/2023 0.0  0.0 - 0.2 % Final   Performed at [facility], [address], [place], [place] [address]   Opiates 07/08/2023 NONE DETECTED  NONE DETECTED Final   Cocaine 07/08/2023 POSITIVE (A)  NONE DETECTED Final   Benzodiazepines 07/08/2023 NONE DETECTED  NONE DETECTED Final   Amphetamines 07/08/2023 NONE DETECTED  NONE DETECTED Final   Tetrahydrocannabinol 07/08/2023 NONE DETECTED  NONE DETECTED Final   Barbiturates 07/08/2023 NONE DETECTED  NONE DETECTED Final   Comment: (NOTE) DRUG SCREEN FOR MEDICAL PURPOSES ONLY.  IF CONFIRMATION IS NEEDED FOR ANY PURPOSE, NOTIFY LAB WITHIN 5 DAYS.  LOWEST DETECTABLE LIMITS FOR URINE DRUG SCREEN Drug Class                     Cutoff (ng/mL) Amphetamine and metabolites    1000 Barbiturate and metabolites    200 Benzodiazepine                 200 Opiates and metabolites        300 Cocaine and metabolites        300 THC                            50 Performed at [facility], [address] [place], [place] [address]     Blood Alcohol level:  Lab Results  Component Value Date   [facility] <10 07/08/2023   ETH <10 08/04/2022    Metabolic Disorder Labs: Lab Results  Component Value Date   HGBA1C 5.8 (H) 07/11/2023   MPG 119.76 07/11/2023   MPG 111.15 08/04/2022   Lab Results  Component Value Date   PROLACTIN 7.6 08/04/2022   Lab Results  Component Value Date   CHOL 182 08/04/2022   TRIG 56 08/04/2022   HDL 55 08/04/2022    CHOLHDL 3.3 08/04/2022   VLDL 11 08/04/2022   LDLCALC 116 (H) 08/04/2022    Therapeutic Lab Levels: No results found for: "LITHIUM" No results found for: "VALPROATE" No results found for: "CBMZ"  Physical Findings   AIMS    Flowsheet Row [facility]  AIMS Total Score 0      AUDIT    Flowsheet Row Ad[facility]  Alcohol Use Disorder Identification Test Final Score (AUDIT) 2      PHQ2-9    Flowsheet Row ED from 07/08/2023 in [facility] ED from 08/04/2022 in [facility]  PHQ-2 Total Score 2 4  PHQ-9 Total Score 7 10      Flowsheet Row ED from 07/09/2023 in [facility] Most recent reading at 07/10/2023 12:48 AM ED from 07/08/2023 in [facility] Most recent reading at 07/09/2023  2:09 AM ED from 07/08/2023 in [facility] Emergency Department at [facility] Most recent reading at 07/08/2023  7:09 PM  C-SSRS RISK CATEGORY No Risk High Risk No Risk        Musculoskeletal  Strength & Muscle Tone: within normal limited Gait & Station: normal  Patient leans: NA   Psychiatric Specialty Exam  Presentation  General Appearance:  Disheveled   Eye Contact: Fair   Speech: Clear and Coherent; Normal Rate   Speech Volume: Normal   Handedness: Right    Mood and Affect  Mood: Euthymic   Affect: Appropriate; Congruent; Full Range    Thought Process  Thought Processes: Coherent; Goal Directed; Linear   Descriptions of Associations:Intact   Orientation:Full (Time, Place and Person)   Thought Content:Logical; WDL   Diagnosis of Schizophrenia or Schizoaffective disorder in past: No   Duration of Psychotic Symptoms:  NA   Hallucinations:Hallucinations: None   Ideas of  Reference:None   Suicidal Thoughts:Suicidal Thoughts: No   Homicidal Thoughts:Homicidal Thoughts: No    Sensorium  Memory: Immediate Good  Judgment: Poor  Insight: Shallow   Executive Functions  Concentration: Good  Attention Span: Good  Recall: Good  Fund of Knowledge: Good  Language: Good   Psychomotor Activity  Psychomotor Activity: Psychomotor Activity: Normal   Assets  Assets: Communication Skills; Desire for Improvement; Resilience   Sleep  Sleep: Sleep: Fair   Physical Exam  Physical Exam Vitals and nursing note reviewed.  Constitutional:      General: She is not in acute distress.    Appearance: She is not ill-appearing, toxic-appearing or diaphoretic.  HENT:     Head: Normocephalic and atraumatic.  Eyes:     Conjunctiva/sclera: Conjunctivae normal.  Pulmonary:     Effort: Pulmonary effort is normal. No respiratory distress.  Neurological:     General: No focal deficit present.     Mental Status: She is alert.     Gait: Gait normal.    Blood pressure 120/83, pulse 60, temperature 98.7 F (37.1 C), temperature source Oral, resp. rate 18, last menstrual period 11/18/2018, SpO2 100%. There is no height or weight on file to calculate BMI.  Treatment Plan Summary: Daily contact with patient to assess and evaluate symptoms and progress in treatment and Medication management  Cocaine use d/o  SIMD  Improved, currently euthymic, sleep and appetite are good, no SI.  Encouraged maintaining abstinence Comfort PRN per below Amenable for residential rehab   CVA (05/2023) See H&P 07/10/2023 Continued home asa 81 mg at bedtime  Continued home lipitor 40 mg at bedtime Continued home plavix 75 mg at bedtime x3 weeks, then DC per neuro   Nicotine use d/o Declined NRTs   Dispo:  Amenable to residential rehab  Princess Bruins, DO Psych Resident, PGY-3 07/11/2023 3:10 PM

## 2023-07-11 NOTE — ED Notes (Signed)
Pt currently in her room. Is calm and cooperative. Denies any needs at this moment. Will continue to monitor for safety.

## 2023-07-11 NOTE — Discharge Instructions (Addendum)
[facility] Follow up on 06/08/2023.   Specialty: Family Medicine Why: Your appointment is at 10:00 am. Please arrive early and bring picture ID, insurance card and current medications. Contact information: [address] [phone]            [name]. Schedule an appointment as soon as possible for a visit in 1 month(s).   Specialty: Neurology Why: stroke clinic Contact information: [address] [place] [address] [phone]   [facility] [address], [place], [address] [phone] phone  New Patient Assessment/Therapy Walk-Ins:  Monday and Wednesday: 8 am until slots are full. Every 1st and 2nd Fridays of the month: 1 pm - 5 pm.  NO ASSESSMENT/THERAPY WALK-INS ON TUESDAYS OR THURSDAYS  New Patient Assessment/Medication Management Walk-Ins:  Monday - Friday:  8 am - 11 am.  For all walk-ins, we ask that you arrive by 7:30 am because patients will be seen in the order of arrival.  Availability is limited; therefore, you may not be seen on the same day that you walk-in.  Our goal is to serve and meet the needs of our community to the best of our ability.  SUBSTANCE USE TREATMENT for Medicaid and State Funded/IPRS  [facility] [address], [place], [address] [phone] phone NOTE: [facility] is no longer offering IOP services.  Serves those who are low-income or have no insurance.  [facility] [address], [place], [place], [address] [phone] phone [phone] fax NOTE: Does have Substance Abuse-Intensive Outpatient Program [facility]) as well as transitional housing if eligible.  [facility] [address] [place], [place], [address] [phone] phone [phone] fax  [facility] [phone] [place], [place], [address] [phone]  phone [phone] fax  HALFWAY HOUSES:  [facility] [phone]  [employer].oxfordvacancies.com  12 STEP PROGRAMS:  Alcoholics Anonymous of [place] [employer]  Narcotics Anonymous of [place] [employer]  Al-Anon of [place], [place] www.[place]alanon.org/find-meetings.html  Nar-Anon https://nar-anon.org/find-a-meetin  List of Residential placements:   [facility] in [place]: [phone]  [facility]: [phone]  [place], [place] [phone]: Female and female facility; 30-day program: (uninsured and Medicaid such as Laurena Bering, [place], [place], partners)  [facility]: [phone]; men and women's facility; 28 days; Can have Medicaid tailored plan Tour manager or Partners)  [facility]: [phone] Karoline Caldwell or Larita Fife; 28 day program; must be fully detox; tailored Medicaid or no insurance  [address] in [place], [place]; [phone]; 28 day all males program; no insurance accepted  [facility] Referral in [place]: Gabriel Rung [phone] (no insurance or Medicaid only); 90 days; outpatient services but provide housing in apartments downtown [place]  [facility] Admission: [phone]: Patient must complete phone screening for placement: [place], [place]; 6 month program; uninsured, Medicaid, and [employer].   [facility]: no insurance required; [phone]  [facility]: [phone]; Intake: Molly Maduro; Must fill out application online; Alecia Lemming Delay [phone] x [phone] Mission in [place], [place]: [phone]; Admissions Coordinators Mr. Maurine Minister or Barron Alvine; 90 day program.  [facility]: [place], [place] [phone]; Co-Ed 9 month to a year program; Online application; Men entry fee is $500 (6-52months);  [employer]: [address] [place], [place] [address]; no fee or insurance  required; minimum of 2 years; Highly structured; work based; Intake Coordinator is Thayer Ohm [phone]  [facility] in  [place], [place]: [phone]; Fax number is [phone]; website: www.[facility].org; Requires 3-6 page autobiography; 2 year program (18 months and then 45month transitional housing); Admission fee is $300; no insurance needed; work Automotive engineer in [place], [place]: [employer] Desk Staff: Danise Edge [phone]: They have a Men's Regenerations Program 6-60months. Free program; There is an initial $300 fee however, they are willing to work with patients regarding that. Application is online.  [facility]: Admissions [phone] Doran Heater ext [phone]; Any 7-90 day program is out of pocket; 12 month program is free of charge; there is a $275 entry fee; Patient is responsible for own transportation

## 2023-07-12 DIAGNOSIS — F1994 Other psychoactive substance use, unspecified with psychoactive substance-induced mood disorder: Secondary | ICD-10-CM | POA: Diagnosis not present

## 2023-07-12 DIAGNOSIS — F1414 Cocaine abuse with cocaine-induced mood disorder: Secondary | ICD-10-CM | POA: Diagnosis not present

## 2023-07-12 DIAGNOSIS — I10 Essential (primary) hypertension: Secondary | ICD-10-CM | POA: Diagnosis not present

## 2023-07-12 DIAGNOSIS — F331 Major depressive disorder, recurrent, moderate: Secondary | ICD-10-CM | POA: Diagnosis not present

## 2023-07-12 NOTE — Progress Notes (Signed)
Pt is awake, alert and oriented X3. Pt complained of back and left arm pain. No signs of acute distress noted. PRN Ibuprofen and scheduled meds administered per order. Pt denies current SI/HI/AVH, plan or intent. Staff will monitor for pt's safety.

## 2023-07-12 NOTE — ED Notes (Addendum)
Patient in the bedroom sleeping. Has no complains at the moment. Denies SI/HI &AVH. NAD. Respirations are even and unlabored. Will continue to monitor for safety.

## 2023-07-12 NOTE — Group Note (Signed)
Group Topic: Relapse and Recovery  Group Date: 07/12/2023 Start Time: 1000 End Time: 1100 Facilitators: Bertram Denver  Department: [facility]  Number of Participants: 5  Group Focus: community group Treatment Modality:  Cognitive Behavioral Therapy Interventions utilized were group exercise Purpose: express feelings  Name: Alexandra Buckley Date of Birth: [birth year]  MR: [id number]    Level of Participation: active Quality of Participation: cooperative Interactions with others: gave feedback Mood/Affect: appropriate Triggers (if applicable): na Cognition: concrete Progress: Moderate Response: na Plan: follow-up needed  Patients Problems:  Patient Active Problem List   Diagnosis Date Noted   H/O: CVA (cerebrovascular accident) 07/11/2023   Substance induced mood disorder (HCC) 07/09/2023   History of trichomoniasis 10/08/2022   Recurrent major depressive disorder, in remission (HCC) 08/31/2022   Psychophysiological insomnia 08/31/2022   Essential hypertension 08/11/2022   Chronic left shoulder pain 08/11/2022   Chronic bilateral low back pain without sciatica 08/11/2022   Mixed hyperlipidemia 08/11/2022   History of suicide attempt 08/05/2022   Tobacco use disorder 08/05/2022   Cocaine abuse with cocaine-induced mood disorder (HCC) 08/04/2022   MDD (major depressive disorder), recurrent severe, without psychosis (HCC) 03/02/2017

## 2023-07-12 NOTE — Tx Team (Addendum)
LCSW met with patient to assess current mood, affect, physical state, and inquire about needs/goals while here in [facility] and after discharge. Patient reports she presented due to needing to detox from crack cocaine. Patient reports she has been using this substance off and on for the last 20 years. Patient reports she has sought treatment in the past at [facility] once, and reports she was just discharged back in January of this year. Patent reports her goal is to return back to [facility] for further treatment as she really wants to get her life back on track. Patient denies any other substances or alcohol use. Patient reports she has been homeless in [facility] for the last 4 months. Patient reports prior to this she was living with family. Patient reports having one child in [place] and three in [place], however reports only having a good relationship with two of them. Patient denies any upcoming court dates or legal charges. Patient aware that LCSW will send referrals out for review and will follow up to provide updates as received. Patient expressed understanding and appreciation of LCSW assistance. No other needs were reported at this time by patient.   Referral has been sent to [facility] and [facility] for review.  LCSW will continue to follow and provide support to patient while on [facility] unit.   Fernande Boyden, LCSW Clinical Social Worker [place] [facility] Ph: [phone]

## 2023-07-12 NOTE — ED Provider Notes (Signed)
Behavioral Health Progress Note  Date and Time: 07/12/2023 3:54 PM Name: Alexandra Buckley MRN:  [id number]  Subjective:  Alexandra Buckley is a 52 y.o. female, with PMH of cocaine use d/o, substance induced mood d/o, tobacco use d/o, suicide attempt x2 (last time 08/2022), inpatient psych admission, CVA (05/2023, see [name] [id number]) who presented to [facility] (07/09/2023) then admitted to [facility] for treatment of cocaine use - residential rehab.    Patient is in bed during the interview.  She reports that she is just catching up on sleep and it we discussed starting trazodone which she was agreeable to.  She reports that she is eating okay.  When asked if she is experiencing other depressive symptoms including mood she became tearful and endorsed significantly worsened mood over the last several months.  She was open to a discussion about starting antidepressants.  She does report having some mood symptoms including depressed mood and lack of energy secondary to cessation of cocaine use but reports that the symptoms have also been persistent for some time and worsened recently by her stroke as well.  When asked about her stroke symptoms she did report confusion around this specifics of her symptoms.  After some discussion she did endorse some left-sided weakness which has persisted since her stroke 2 months ago.  With regard to residential treatment for cocaine use disorder, she is pending day [facility] referral per [employer]  With regard to recent stroke and recommendation for DAPT for 21 days which patient was nonadherent to, case was discussed with on-call stroke provider and was advised to restart ASA but not Plavix.  Discussed that DAPT is effective for the 21 days following a stroke when there is increased risk for stroke recurrence but following this time period of 21 days that the risk for bleeding outweighs the risk of benefit for secondary stroke prevention (due to low risk of stroke recurrence  following the 21-day period).   Review of Systems  Constitutional:  Negative for fever.  Cardiovascular:  Negative for chest pain and palpitations.  Gastrointestinal:  Negative for constipation, diarrhea, nausea and vomiting.  Neurological:  Negative for dizziness, weakness and headaches.       Some mild focal weakness of the left side consistent with recent stroke.      Diagnosis:  Final diagnoses:  Cocaine abuse with cocaine-induced mood disorder (HCC)  History of suicide attempt  Tobacco use disorder  Mixed hyperlipidemia  H/O: CVA (cerebrovascular accident)    Past Psychiatric History:  Diagnoses: cocaine use d/o, substance induced mood d/o, tobacco use d/o, suicide attempt x2 (last time 08/2022), inpatient psych admission Medication trials: doesn't remember Hospitalizations: yes Suicide attempts: x2 - 08/2022, 2018 - ingestion of cleaning products   Substance Use History: Nicotine:  reports that she has quit smoking. Her smoking use included cigarettes. She has never used smokeless tobacco. IV drug use: Denied Stimulants: Cocaine DT: Denied Detox: Yes Residential: Yes   Past Medical History: Dx:  has a past medical history of History of suicide attempt (08/05/2022) and Tobacco use disorder (08/05/2022).  Allergies: Patient has no known allergies.    Family Psychiatric History: None reported   Social History:  Housing: housing instability Income: unemployed Family: daughter, grandchildren  Total Time spent with patient: 30 minutes  Current Medications:  Current Facility-Administered Medications  Medication Dose Route Frequency Provider Last Rate Last Admin   acetaminophen (TYLENOL) tablet 650 mg  650 mg Oral Q6H PRN Princess Bruins, DO   650 mg  at 07/11/23 0948   alum & mag hydroxide-simeth (MAALOX/MYLANTA) 200-200-20 MG/5ML suspension 30 mL  30 mL Oral Q4H PRN Olin Pia M, NP       amLODipine (NORVASC) tablet 5 mg  5 mg Oral Daily Rayburn Go, Veronique M,  NP   5 mg at 07/12/23 0840   aspirin EC tablet 81 mg  81 mg Oral QHS Princess Bruins, DO   81 mg at 07/11/23 2133   atorvastatin (LIPITOR) tablet 40 mg  40 mg Oral QHS Princess Bruins, DO   40 mg at 07/11/23 2133   ibuprofen (ADVIL) tablet 200 mg  200 mg Oral Q4H PRN Princess Bruins, DO   200 mg at 07/12/23 1610   magnesium hydroxide (MILK OF MAGNESIA) suspension 30 mL  30 mL Oral Daily PRN Marlou Sa, NP       Current Outpatient Medications  Medication Sig Dispense Refill   amLODipine (NORVASC) 5 MG tablet Take 1 tablet (5 mg total) by mouth daily. (Patient not taking: Reported on 07/09/2023) 30 tablet 1   atorvastatin (LIPITOR) 10 MG tablet Take 1 tablet (10 mg total) by mouth at bedtime. (Patient not taking: Reported on 07/09/2023) 30 tablet 1   cyclobenzaprine (FLEXERIL) 10 MG tablet Take 1 tablet (10 mg total) by mouth 3 (three) times daily as needed for muscle spasms. (Patient not taking: Reported on 07/09/2023) 60 tablet 0   hydrochlorothiazide (HYDRODIURIL) 25 MG tablet Take 1 tablet (25 mg total) by mouth every morning. (Patient not taking: Reported on 07/09/2023) 30 tablet 1   hydrOXYzine (ATARAX) 25 MG tablet Take 1 tablet (25 mg total) by mouth at bedtime as needed. (Patient not taking: Reported on 07/09/2023) 30 tablet 1   meloxicam (MOBIC) 15 MG tablet Take 1 tablet (15 mg total) by mouth daily. (Patient not taking: Reported on 07/09/2023) 30 tablet 1   potassium chloride SA (KLOR-CON M) 20 MEQ tablet Take 1 tablet (20 mEq total) by mouth 2 (two) times daily. (Patient taking differently: Take 20 mEq by mouth 2 (two) times daily. Take for 5 days starting on 07/08/23.) 10 tablet 0    Labs  Lab Results:  Admission on 07/09/2023  Component Date Value Ref Range Status   Hgb A1c MFr Bld 07/11/2023 5.8 (H)  4.8 - 5.6 % Final   Comment: (NOTE) Pre diabetes:          5.7%-6.4%  Diabetes:              >6.4%  Glycemic control for   <7.0% adults with diabetes    Mean Plasma Glucose  07/11/2023 119.76  mg/dL Final   Performed at [facility], [address], [place], [place] [address]  Admission on 07/08/2023, Discharged on 07/09/2023  Component Date Value Ref Range Status   Potassium 07/09/2023 3.8  3.5 - 5.1 mmol/L Final   Performed at [facility], [address] [place], [place] [address]  Admission on 07/08/2023, Discharged on 07/08/2023  Component Date Value Ref Range Status   Sodium 07/08/2023 142  135 - 145 mmol/L Final   Potassium 07/08/2023 2.8 (L)  3.5 - 5.1 mmol/L Final   Chloride 07/08/2023 106  98 - 111 mmol/L Final   CO2 07/08/2023 24  22 - 32 mmol/L Final   Glucose, Bld 07/08/2023 128 (H)  70 - 99 mg/dL Final   Glucose reference range applies only to samples taken after fasting for at least 8 hours.   BUN 07/08/2023 12  6 -  20 mg/dL Final   Creatinine, Ser 07/08/2023 1.10 (H)  0.44 - 1.00 mg/dL Final   Calcium 16/07/9603 9.6  8.9 - 10.3 mg/dL Final   Total Protein 54/06/8118 7.5  6.5 - 8.1 g/dL Final   Albumin 14/78/2956 3.9  3.5 - 5.0 g/dL Final   AST 21/30/8657 19  15 - 41 U/L Final   ALT 07/08/2023 22  0 - 44 U/L Final   Alkaline Phosphatase 07/08/2023 99  38 - 126 U/L Final   Total Bilirubin 07/08/2023 0.5  0.3 - 1.2 mg/dL Final   GFR, Estimated 07/08/2023 60 (L)  >60 mL/min Final   Comment: (NOTE) Calculated using the CKD-EPI Creatinine Equation (2021)    Anion gap 07/08/2023 12  5 - 15 Final   Performed at [facility], [address], [place], [place] [address]   Alcohol, Ethyl (B) 07/08/2023 <10  <10 mg/dL Final   Comment: (NOTE) Lowest detectable limit for serum alcohol is 10 mg/dL.  For medical purposes only. Performed at [facility], [address], [place], [place] [address]    WBC 07/08/2023 7.4  4.0 - 10.5 K/uL Final   RBC 07/08/2023 5.22 (H)  3.87 - 5.11 MIL/uL Final   Hemoglobin 07/08/2023 15.0  12.0 - 15.0 g/dL Final   HCT 41/32/4401 48.6 (H)  36.0 - 46.0 % Final   MCV 07/08/2023 93.1  80.0 -  100.0 fL Final   MCH 07/08/2023 28.7  26.0 - 34.0 pg Final   MCHC 07/08/2023 30.9  30.0 - 36.0 g/dL Final   RDW 02/72/5366 14.1  11.5 - 15.5 % Final   Platelets 07/08/2023 270  150 - 400 K/uL Final   nRBC 07/08/2023 0.0  0.0 - 0.2 % Final   Performed at [facility], [address] [place], [place] [address]   Opiates 07/08/2023 NONE DETECTED  NONE DETECTED Final   Cocaine 07/08/2023 POSITIVE (A)  NONE DETECTED Final   Benzodiazepines 07/08/2023 NONE DETECTED  NONE DETECTED Final   Amphetamines 07/08/2023 NONE DETECTED  NONE DETECTED Final   Tetrahydrocannabinol 07/08/2023 NONE DETECTED  NONE DETECTED Final   Barbiturates 07/08/2023 NONE DETECTED  NONE DETECTED Final   Comment: (NOTE) DRUG SCREEN FOR MEDICAL PURPOSES ONLY.  IF CONFIRMATION IS NEEDED FOR ANY PURPOSE, NOTIFY LAB WITHIN 5 DAYS.  LOWEST DETECTABLE LIMITS FOR URINE DRUG SCREEN Drug Class                     Cutoff (ng/mL) Amphetamine and metabolites    1000 Barbiturate and metabolites    200 Benzodiazepine                 200 Opiates and metabolites        300 Cocaine and metabolites        300 THC                            50 Performed at [facility], [address], [place], [place] [address]     Blood Alcohol level:  Lab Results  Component Value Date   [facility] <10 07/08/2023   ETH <10 08/04/2022    Metabolic Disorder Labs: Lab Results  Component Value Date   HGBA1C 5.8 (H) 07/11/2023   MPG 119.76 07/11/2023   MPG 111.15 08/04/2022   Lab Results  Component Value Date   PROLACTIN 7.6 08/04/2022   Lab Results  Component Value Date  CHOL 182 08/04/2022   TRIG 56 08/04/2022   HDL 55 08/04/2022   CHOLHDL 3.3 08/04/2022   VLDL 11 08/04/2022   LDLCALC 116 (H) 08/04/2022    Therapeutic Lab Levels: No results found for: "LITHIUM" No results found for: "VALPROATE" No results found for: "CBMZ"  Physical Findings   AIMS    Flowsheet Row [facility]  AIMS Total Score 0      AUDIT    Flowsheet Row [facility]  Alcohol Use Disorder Identification Test Final Score (AUDIT) 2      PHQ2-9    Flowsheet Row ED from 07/09/2023 in [facility] ED from 07/08/2023 in [facility] ED from 08/04/2022 in [facility]  PHQ-2 Total Score 2 2 4   PHQ-9 Total Score 8 7 10       Flowsheet Row ED from 07/09/2023 in [facility] Most recent reading at 07/10/2023 12:48 AM ED from 07/08/2023 in [facility] Most recent reading at 07/09/2023  2:09 AM ED from 07/08/2023 in [facility] Department at [facility] Most recent reading at 07/08/2023  7:09 PM  C-SSRS RISK CATEGORY No Risk High Risk No Risk        Musculoskeletal  Strength & Muscle Tone: within normal limited Gait & Station: normal  Patient leans: NA   Psychiatric Specialty Exam  Presentation  General Appearance:  Appropriate for Environment   Eye Contact: Fair   Speech: Slow   Speech Volume: Normal   Handedness: Right    Mood and Affect  Mood: Depressed   Affect: Congruent; Depressed; Tearful    Thought Process  Thought Processes: Coherent   Descriptions of Associations:Intact   Orientation:Full (Time, Place and Person)   Thought Content:Logical   Diagnosis of Schizophrenia or Schizoaffective disorder in past: No   Duration of Psychotic Symptoms:  NA   Hallucinations:Hallucinations: None   Ideas of Reference:None   Suicidal Thoughts:Suicidal Thoughts: No   Homicidal Thoughts:Homicidal Thoughts: No    Sensorium  Memory: Recent Fair; Recent Good; Remote Good; Immediate Good  Judgment: Fair  Insight: Fair   Art therapist   Concentration: Good  Attention Span: Good  Recall: Good  Fund of Knowledge: Good  Language: Good   Psychomotor Activity  Psychomotor Activity: Psychomotor Activity: Normal   Assets  Assets: Desire for Improvement; Social Support   Sleep  Sleep: Sleep: Fair (Patient was still in bed at almost noon when interview occurred.) Number of Hours of Sleep: 11   Physical Exam  Physical Exam Vitals and nursing note reviewed.  Constitutional:      General: She is not in acute distress.    Appearance: She is not ill-appearing, toxic-appearing or diaphoretic.  HENT:     Head: Normocephalic and atraumatic.  Eyes:     Conjunctiva/sclera: Conjunctivae normal.  Pulmonary:     Effort: Pulmonary effort is normal. No respiratory distress.  Neurological:     General: No focal deficit present.     Mental Status: She is alert.     Gait: Gait normal.    Blood pressure (!) 135/100, pulse 72, temperature 98.7 F (37.1 C), temperature source Oral, resp. rate 18, last menstrual period 11/18/2018, SpO2 100%. There is no height or weight on file to calculate BMI.  Treatment Plan Summary: Daily contact with patient to assess and  evaluate symptoms and progress in treatment and Medication management  Cocaine use d/o  SIMD v MDD Reports some depressive symptoms that may be secondary to cocaine withdrawal.  Discussed starting antidepressant medication and will defer decision to start tomorrow or later this admission. As needed medications for symptomatic treatment Pending day [facility] residential treatment referral Started trazodone 50 mg once at night for insomnia   CVA (05/2023) Discussed case with stroke neurology who recommended to just continue ASA given beyond the 21 days during which the benefit to risk ratio of Plavix favors treatment for typical ischemic strokes. Continued home asa 81 mg at bedtime for secondary stroke prevention Continued home lipitor 40 mg at bedtime for  secondary prevention and elevated LDL >70 Stopping Plavix, per stroke neurology   Nicotine use d/o Declined NRTs   Dispo: Pending day [facility] residential treatment referral  Meryl Dare, MD PGY-1 Psychiatry Resident 07/12/2023, 3:54 PM

## 2023-07-12 NOTE — ED Notes (Signed)
Patient is in the bedroom sleeping.NAD.  Respirations are even and unlabored.  Will continue to monitor for safety.

## 2023-07-12 NOTE — ED Notes (Signed)
Patient is sleeping. Respirations equal and unlabored, skin warm and dry. No change in assessment or acuity. Routine safety checks conducted according to facility protocol. Will continue to monitor for safety.   

## 2023-07-12 NOTE — Progress Notes (Signed)
 Pt slept most of the shift but woke up for meals. No distress noted or concerns voiced. Staff will monitor for pt's safety.

## 2023-07-12 NOTE — Group Note (Signed)
Group Topic: Wellness  Group Date: 07/12/2023 Start Time: 2000 End Time: 2030 Facilitators: Guss Bunde  Department: [facility]  Number of Participants: 5  Group Focus: check in Treatment Modality:  Leisure Development Interventions utilized were support Purpose: reinforce self-care  Name: Alexandra Buckley Date of Birth: 01-05-1971  MR: [id number]    Level of Participation: active Quality of Participation: cooperative Interactions with others: gave feedback Mood/Affect: appropriate Triggers (if applicable):  Cognition: goal directed Progress:  Response: Pt wants to start saying NO to people without feeling guilty Plan: patient will be encouraged to follow up with wellness goals  Patients Problems:  Patient Active Problem List   Diagnosis Date Noted   H/O: CVA (cerebrovascular accident) 07/11/2023   Substance induced mood disorder (HCC) 07/09/2023   History of trichomoniasis 10/08/2022   Recurrent major depressive disorder, in remission (HCC) 08/31/2022   Psychophysiological insomnia 08/31/2022   Essential hypertension 08/11/2022   Chronic left shoulder pain 08/11/2022   Chronic bilateral low back pain without sciatica 08/11/2022   Mixed hyperlipidemia 08/11/2022   History of suicide attempt 08/05/2022   Tobacco use disorder 08/05/2022   Cocaine abuse with cocaine-induced mood disorder (HCC) 08/04/2022   MDD (major depressive disorder), recurrent severe, without psychosis (HCC) 03/02/2017

## 2023-07-13 DIAGNOSIS — I10 Essential (primary) hypertension: Secondary | ICD-10-CM | POA: Diagnosis not present

## 2023-07-13 DIAGNOSIS — F331 Major depressive disorder, recurrent, moderate: Secondary | ICD-10-CM | POA: Diagnosis not present

## 2023-07-13 DIAGNOSIS — F1994 Other psychoactive substance use, unspecified with psychoactive substance-induced mood disorder: Secondary | ICD-10-CM | POA: Diagnosis not present

## 2023-07-13 DIAGNOSIS — F1414 Cocaine abuse with cocaine-induced mood disorder: Secondary | ICD-10-CM | POA: Diagnosis not present

## 2023-07-13 MED ORDER — PROPRANOLOL HCL 10 MG PO TABS
10.0000 mg | ORAL_TABLET | Freq: Once | ORAL | Status: AC
  Administered 2023-07-13: 10 mg via ORAL
  Filled 2023-07-13: qty 1

## 2023-07-13 MED ORDER — AMLODIPINE BESYLATE 10 MG PO TABS
10.0000 mg | ORAL_TABLET | Freq: Every day | ORAL | Status: DC
  Filled 2023-07-13: qty 14

## 2023-07-13 MED ORDER — SERTRALINE HCL 50 MG PO TABS: 50.0000 mg | ORAL_TABLET | Freq: Every day | ORAL | 0 refills | Status: DC

## 2023-07-13 MED ORDER — PRAZOSIN HCL 1 MG PO CAPS
1.0000 mg | ORAL_CAPSULE | Freq: Every day | ORAL | Status: DC
  Administered 2023-07-13: 1 mg via ORAL
  Filled 2023-07-13: qty 14
  Filled 2023-07-13: qty 1

## 2023-07-13 MED ORDER — PRAZOSIN HCL 1 MG PO CAPS: 1.0000 mg | ORAL_CAPSULE | Freq: Every day | ORAL | 0 refills | Status: DC

## 2023-07-13 MED ORDER — ASPIRIN 81 MG PO TBEC: 81.0000 mg | DELAYED_RELEASE_TABLET | Freq: Every day | ORAL | Status: DC

## 2023-07-13 MED ORDER — ATORVASTATIN CALCIUM 40 MG PO TABS: 40.0000 mg | ORAL_TABLET | Freq: Every day | ORAL | 0 refills | Status: DC

## 2023-07-13 MED ORDER — TRAZODONE HCL 50 MG PO TABS: 50.0000 mg | ORAL_TABLET | Freq: Every evening | ORAL | 0 refills | Status: DC | PRN

## 2023-07-13 MED ORDER — IBUPROFEN 200 MG PO TABS: 200.0000 mg | ORAL_TABLET | ORAL | 0 refills | Status: DC | PRN

## 2023-07-13 MED ORDER — SERTRALINE HCL 50 MG PO TABS
50.0000 mg | ORAL_TABLET | Freq: Every day | ORAL | Status: DC
  Administered 2023-07-13: 50 mg via ORAL
  Filled 2023-07-13: qty 1
  Filled 2023-07-13: qty 14

## 2023-07-13 MED ORDER — TRAZODONE HCL 50 MG PO TABS
50.0000 mg | ORAL_TABLET | Freq: Every evening | ORAL | Status: DC | PRN
  Administered 2023-07-13: 50 mg via ORAL
  Filled 2023-07-13: qty 1
  Filled 2023-07-13: qty 14

## 2023-07-13 MED ORDER — AMLODIPINE BESYLATE 5 MG PO TABS: 5.0000 mg | ORAL_TABLET | Freq: Every day | ORAL | 0 refills | Status: DC

## 2023-07-13 NOTE — ED Notes (Signed)
Patient observed/assessed in room in bed appearing in no immediate distress resting peacefully. Q15 minute checks continued by MHT and nursing staff. Will continue to monitor and support. 

## 2023-07-13 NOTE — Progress Notes (Signed)
Pt's BP was elevated. Dr. Viviano Simas was notified. OTO of Propranolol 10mg  ordered and was administered. Will update next shift to follow up.

## 2023-07-13 NOTE — Discharge Planning (Signed)
Referral was received and per Marcelino Duster, patient has been accepted and can transfer to the facility on tomorrow 07/14/2023 by 9:00am. Update has been provided to the patient and MD made aware. Patient will need a 14 day supply of medication and one month refill. No nicotine gum allowed, however 14-30 day nicotine patches to be provided if needed. No other needs to report at this time.    LCSW will continue to follow up and provide updates as received.    Fernande Boyden, LCSW Clinical Social Worker [facility] Ph: [phone]

## 2023-07-13 NOTE — ED Notes (Signed)
Patient is in the bedroom sleeping.NAD.  Respirations are even and unlabored.  Will continue to monitor for safety.

## 2023-07-13 NOTE — ED Provider Notes (Signed)
FBC/OBS ASAP Discharge Summary  Date and Time: 07/14/2023 9:25 AM  Name: Alexandra Buckley  MRN:  [id number]   Discharge Diagnoses:  Final diagnoses:  Cocaine abuse with cocaine-induced mood disorder (HCC)  History of suicide attempt  Tobacco use disorder  Mixed hyperlipidemia  H/O: CVA (cerebrovascular accident)  Moderate episode of recurrent major depressive disorder [facility])   Stay Summary:  Alexandra Buckley is a 52 y.o. female, with PMH of cocaine use d/o, substance induced mood d/o, tobacco use d/o, suicide attempt x2 (last time 08/2022), inpatient psych admission, and CVA who presented to [facility] (07/09/2023) then admitted to [facility] for treatment of cocaine use - residential rehab.   During the patient's hospitalization at the [facility], patient had extensive initial psychiatric evaluation, with daily follow-up assessments focused on detoxification management.  Psychiatric diagnoses provided upon initial assessment:  Cocaine Use disorder SIMD vs MDD PTSD Nicotine use d/o  Patient's psychiatric medications were adjusted during hospitalization:  Cocaine use d/o  SIMD v MDD Reports some depressive symptoms that may be secondary to cocaine withdrawal.  Discussed starting antidepressant medication and will defer decision to start tomorrow or later this admission. As needed medications for symptomatic treatment Pending day [facility] residential treatment referral Started trazodone 50 mg at bedtime PRN for insomnia Start zoloft 50 mg daily for depression, PTSD, and anxiety   PTSD Started Prazosin 1 mg nightly for nightmares   Nicotine use d/o Declined NRTs   Patient's care was discussed during the interdisciplinary team meeting every day during the hospitalization.  The patient denies having side effects to prescribed psychiatric medication.  Gradually, patient started adjusting to milieu. The patient was evaluated each day by a clinical provider to ascertain response to treatment. Improvement  was noted by the patient's report of decreasing symptoms, improved sleep and appetite, affect, medication tolerance, behavior, and participation in unit programming.  Patient was asked each day to complete a self inventory noting mood, mental status, pain, new symptoms, anxiety and concerns.    Symptoms were reported as significantly decreased or resolved completely by discharge.   On day of discharge, the patient reports that their mood is stable. The patient denied having suicidal thoughts for more than 48 hours prior to discharge.  Patient denies having homicidal thoughts.  Patient denies having auditory hallucinations.  Patient denies any visual hallucinations or other symptoms of psychosis. The patient was motivated to continue taking medication with a goal of continued improvement in mental health.   The patient reported that their withdrawal symptoms and cravings responded well to the detox regimen, with overall benefit from the detox program. Supportive psychotherapy was provided, and the patient participated in regular group therapy sessions focused on managing cravings and withdrawal. Coping skills, problem-solving, and relaxation techniques were also part of the program's therapeutic interventions.  Labs were reviewed with the patient, and abnormal results were discussed with the patient.  The patient is able to verbalize their individual safety plan to this provider.  # It is recommended to the patient to continue psychiatric medications as prescribed, after discharge from the hospital.    # It is recommended to the patient to follow up with your outpatient psychiatric provider and PCP.  # It was discussed with the patient, the impact of alcohol, drugs, tobacco have been there overall psychiatric and medical wellbeing, and total abstinence from substance use was recommended the patient.ed.  # Prescriptions provided or sent directly to preferred pharmacy at discharge. Patient agreeable  to plan. Given opportunity to ask  questions. Appears to feel comfortable with discharge.    # In the event of worsening symptoms, the patient is instructed to call the crisis hotline, 911 and or go to the nearest ED for appropriate evaluation and treatment of symptoms. To follow-up with primary care provider for other medical issues, concerns and or health care needs  # Patient was discharged to [facility] with a plan to follow up as noted below.      Total Time spent with patient: 20 minutes  Past Psychiatric History:  Diagnoses: cocaine use d/o, substance induced mood d/o, tobacco use d/o, suicide attempt x2 (last time 08/2022), inpatient psych admission Medication trials: doesn't remember Hospitalizations: yes Suicide attempts: x2 - 08/2022, 2018 - ingestion of cleaning products   Substance Use History: Nicotine:  reports that she has quit smoking. Her smoking use included cigarettes. She has never used smokeless tobacco. IV drug use: Denied Stimulants: Cocaine DT: Denied Detox: Yes Residential: Yes   Past Medical History: Dx:  has a past medical history of History of suicide attempt (08/05/2022) and Tobacco use disorder (08/05/2022).  Allergies: Patient has no known allergies.    Family Psychiatric History:      Social History:  Housing: housing instability Income: unemployed Family: daughter, grandchildren  Current Medications:  Current Facility-Administered Medications  Medication Dose Route Frequency Provider Last Rate Last Admin   acetaminophen (TYLENOL) tablet 650 mg  650 mg Oral Q6H PRN Princess Bruins, DO   650 mg at 07/11/23 0948   alum & mag hydroxide-simeth (MAALOX/MYLANTA) 200-200-20 MG/5ML suspension 30 mL  30 mL Oral Q4H PRN Rayburn Go, Veronique M, NP       amLODipine (NORVASC) tablet 10 mg  10 mg Oral Daily Mariel Craft, MD       aspirin EC tablet 81 mg  81 mg Oral QHS Princess Bruins, DO   81 mg at 07/13/23 2107   atorvastatin  (LIPITOR) tablet 40 mg  40 mg Oral QHS Princess Bruins, DO   40 mg at 07/13/23 2107   ibuprofen (ADVIL) tablet 200 mg  200 mg Oral Q4H PRN Princess Bruins, DO   200 mg at 07/13/23 1851   magnesium hydroxide (MILK OF MAGNESIA) suspension 30 mL  30 mL Oral Daily PRN Marlou Sa, NP       prazosin (MINIPRESS) capsule 1 mg  1 mg Oral QHS Carrion-Carrero, Emree Locicero, MD   1 mg at 07/13/23 2107   sertraline (ZOLOFT) tablet 50 mg  50 mg Oral Daily Carrion-Carrero, Percell Lamboy, MD   50 mg at 07/13/23 1344   traZODone (DESYREL) tablet 50 mg  50 mg Oral QHS PRN Lorri Frederick, MD   50 mg at 07/13/23 2107   Current Outpatient Medications  Medication Sig Dispense Refill   amLODipine (NORVASC) 10 MG tablet Take 1 tablet (10 mg total) by mouth daily. 30 tablet 0   aspirin EC 81 MG tablet Take 1 tablet (81 mg total) by mouth at bedtime. Swallow whole.     atorvastatin (LIPITOR) 40 MG tablet Take 1 tablet (40 mg total) by mouth at bedtime. 30 tablet 0   ibuprofen (ADVIL) 200 MG tablet Take 1 tablet (200 mg total) by mouth every 4 (four) hours as needed for fever, headache, mild pain, moderate pain or cramping. 30 tablet 0   meloxicam (MOBIC) 15 MG tablet Take 1 tablet (15 mg total) by mouth daily. (Patient not taking: Reported on 07/09/2023) 30 tablet 1   prazosin (MINIPRESS) 1 MG capsule Take 1 capsule (  1 mg total) by mouth at bedtime. 30 capsule 0   sertraline (ZOLOFT) 50 MG tablet Take 1 tablet (50 mg total) by mouth daily. 30 tablet 0   traZODone (DESYREL) 50 MG tablet Take 1 tablet (50 mg total) by mouth at bedtime as needed for sleep. 30 tablet 0    PTA Medications:  PTA Medications  Medication Sig   meloxicam (MOBIC) 15 MG tablet Take 1 tablet (15 mg total) by mouth daily. (Patient not taking: Reported on 07/09/2023)   aspirin EC 81 MG tablet Take 1 tablet (81 mg total) by mouth at bedtime. Swallow whole.   atorvastatin (LIPITOR) 40 MG tablet Take 1 tablet (40 mg total) by mouth at bedtime.    prazosin (MINIPRESS) 1 MG capsule Take 1 capsule (1 mg total) by mouth at bedtime.   sertraline (ZOLOFT) 50 MG tablet Take 1 tablet (50 mg total) by mouth daily.   traZODone (DESYREL) 50 MG tablet Take 1 tablet (50 mg total) by mouth at bedtime as needed for sleep.   ibuprofen (ADVIL) 200 MG tablet Take 1 tablet (200 mg total) by mouth every 4 (four) hours as needed for fever, headache, mild pain, moderate pain or cramping.   amLODipine (NORVASC) 10 MG tablet Take 1 tablet (10 mg total) by mouth daily.   Facility Ordered Medications  Medication   acetaminophen (TYLENOL) tablet 650 mg   alum & mag hydroxide-simeth (MAALOX/MYLANTA) 200-200-20 MG/5ML suspension 30 mL   magnesium hydroxide (MILK OF MAGNESIA) suspension 30 mL   ibuprofen (ADVIL) tablet 200 mg   aspirin EC tablet 81 mg   atorvastatin (LIPITOR) tablet 40 mg   [COMPLETED] ibuprofen (ADVIL) tablet 400 mg   traZODone (DESYREL) tablet 50 mg   sertraline (ZOLOFT) tablet 50 mg   prazosin (MINIPRESS) capsule 1 mg   [COMPLETED] propranolol (INDERAL) tablet 10 mg   amLODipine (NORVASC) tablet 10 mg       07/12/2023    3:13 PM 07/09/2023   10:38 AM 08/06/2022    2:06 PM  Depression screen PHQ 2/9  Decreased Interest 1 1 3   Down, Depressed, Hopeless 1 1 1   PHQ - 2 Score 2 2 4   Altered sleeping 2 1 0  Tired, decreased energy 1 1 0  Change in appetite 0 0 1  Feeling bad or failure about yourself  1 1 1   Trouble concentrating 1 1   Moving slowly or fidgety/restless 1 1 2   Suicidal thoughts 0 0 2  PHQ-9 Score 8 7 10   Difficult doing work/chores Very difficult Very difficult Somewhat difficult    Flowsheet Row ED from 07/09/2023 in [facility] Most recent reading at 07/10/2023 12:48 AM ED from 07/08/2023 in [facility] Most recent reading at 07/09/2023  2:09 AM ED from 07/08/2023 in [facility] Emergency Department at [facility] Most recent reading at 07/08/2023  7:09 PM   C-SSRS RISK CATEGORY No Risk High Risk No Risk       Musculoskeletal  Strength & Muscle Tone: within normal limits Gait & Station: normal Patient leans: N/A  Psychiatric Specialty Exam  Presentation  General Appearance:  Appropriate for Environment  Eye Contact: Good  Speech: Clear and Coherent; Normal Rate  Speech Volume: Normal  Handedness: -- (not assessed)   Mood and Affect  Mood: -- ("Nervous")  Affect: Congruent; Full Range   Thought Process  Thought Processes: Linear  Descriptions of Associations:Intact  Orientation:-- (grossly intact)  Thought Content:Logical  Diagnosis of  Schizophrenia or Schizoaffective disorder in past: No    Hallucinations:Hallucinations: None  Ideas of Reference:None  Suicidal Thoughts:Suicidal Thoughts: No  Homicidal Thoughts:Homicidal Thoughts: No   Sensorium  Memory: Immediate Good; Recent Good; Remote Good  Judgment: Fair  Insight: Fair   Chartered certified accountant: Fair  Attention Span: Fair  Recall: Fair  Fund of Knowledge: Fair  Language: Fair   Psychomotor Activity  Psychomotor Activity: Psychomotor Activity: Normal   Assets  Assets: Desire for Improvement; Resilience; Communication Skills   Sleep  Sleep: Sleep: Fair   Physical Exam  Physical Exam ROS Blood pressure 137/71, pulse 70, temperature 98.3 F (36.8 C), temperature source Oral, resp. rate 18, last menstrual period 11/18/2018, SpO2 99%. There is no height or weight on file to calculate BMI.  Demographic Factors:  Low socioeconomic status  Loss Factors: Loss of significant relationship  Historical Factors: NA  Risk Reduction Factors:   Positive social support  Continued Clinical Symptoms:  Alcohol/Substance Abuse/Dependencies  Cognitive Features That Contribute To Risk:  None    Suicide Risk:  Mild:  Suicidal ideation of limited frequency, intensity, duration, and specificity.  There are  no identifiable plans, no associated intent, mild dysphoria and related symptoms, good self-control (both objective and subjective assessment), few other risk factors, and identifiable protective factors, including available and accessible social support.  Plan Of Care/Follow-up recommendations:  Activity: as tolerated  Diet: heart healthy  Other: -Follow-up with your outpatient psychiatric provider -instructions on appointment date, time, and address (location) are provided to you in discharge paperwork.  -Take your psychiatric medications as prescribed at discharge - instructions are provided to you in the discharge paperwork  -Follow-up with outpatient primary care doctor and other specialists -for management of preventative medicine and chronic medical disease, including:  Hypertension Patient's home amlodipine 5 mg was initially restarted, which patient admitted to being noncompliant prior to admission, and later titrated to amlodipine 10 mg daily Patient required 1X dose of Inderal 10 mg for SBP >180 On day of discharge patient's vital signs appear stable, SBP ranging in 130s, DBP ranging in 70s  Prediabetes Nonfasting blood glucose on CMP 128, A1c 5.8%  CVA (05/2023) Discussed case with stroke neurology who recommended to just continue ASA given beyond the 21 days during which the benefit to risk ratio of Plavix favors treatment for typical ischemic strokes. Continued home asa 81 mg at bedtime for secondary stroke prevention Continued home lipitor 40 mg at bedtime for secondary prevention and elevated LDL >70 Stopping Plavix, per stroke neurology  -Testing: Follow-up with outpatient provider for abnormal lab results:   Pending CMP at discharge  -Recommend abstinence from alcohol, tobacco, and other illicit drug use at discharge.   -If your psychiatric symptoms recur, worsen, or if you have side effects to your psychiatric medications, call your outpatient psychiatric provider,  [phone], [phone] or go to the nearest emergency department.  -If suicidal thoughts recur, call your outpatient psychiatric provider, 911, [phone] or go to the nearest emergency department.   Disposition: [facility]  Lorri Frederick, MD 07/14/2023, 9:25 AM

## 2023-07-13 NOTE — Group Note (Signed)
Group Topic: Communication  Group Date: 07/13/2023 Start Time: 1715 End Time: 1812 Facilitators: Vonzell Schlatter B  Department: [facility]  Number of Participants: 3  Group Focus: communication and daily focus Treatment Modality:  Psychoeducation Interventions utilized were mental fitness and support Purpose: relapse prevention strategies and trigger / craving management  Name: Tenessa Marsee Date of Birth: 07-08-1971  MR: [id number]    Level of Participation: active Quality of Participation: attentive and cooperative Interactions with others: gave feedback Mood/Affect: positive Triggers (if applicable): n/a Cognition: coherent/clear Progress: Moderate Response: positive Plan: follow-up needed  Patients Problems:  Patient Active Problem List   Diagnosis Date Noted   H/O: CVA (cerebrovascular accident) 07/11/2023   Substance induced mood disorder (HCC) 07/09/2023   History of trichomoniasis 10/08/2022   Recurrent major depressive disorder, in remission (HCC) 08/31/2022   Psychophysiological insomnia 08/31/2022   Essential hypertension 08/11/2022   Chronic left shoulder pain 08/11/2022   Chronic bilateral low back pain without sciatica 08/11/2022   Mixed hyperlipidemia 08/11/2022   History of suicide attempt 08/05/2022   Tobacco use disorder 08/05/2022   Cocaine abuse with cocaine-induced mood disorder (HCC) 08/04/2022   MDD (major depressive disorder), recurrent severe, without psychosis (HCC) 03/02/2017
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
## 2023-07-13 NOTE — Progress Notes (Signed)
Pt stayed in her room most of the shift. Received another dose of Ibuprofen for back pain. No signs of acute distress noted. Staff will monitor for pt's safety.

## 2023-07-13 NOTE — ED Notes (Signed)
Pt is in the bed resting. Respirations are even and unlabored. No acute distress noted. Will continue to monitor for safety

## 2023-07-13 NOTE — Progress Notes (Addendum)
Pt is awake, alert and oriented X3. Pt complained of back and left arm pain on the scale of 8/10. No signs of acute distress noted. PRN Ibuprofen and scheduled med administered per order. Pt denies current SI/HI/AVH, plan or intent. Staff will monitor for pt's safety.

## 2023-07-14 DIAGNOSIS — F1994 Other psychoactive substance use, unspecified with psychoactive substance-induced mood disorder: Secondary | ICD-10-CM | POA: Diagnosis not present

## 2023-07-14 DIAGNOSIS — F331 Major depressive disorder, recurrent, moderate: Secondary | ICD-10-CM | POA: Diagnosis not present

## 2023-07-14 DIAGNOSIS — F1414 Cocaine abuse with cocaine-induced mood disorder: Secondary | ICD-10-CM | POA: Diagnosis not present

## 2023-07-14 DIAGNOSIS — I10 Essential (primary) hypertension: Secondary | ICD-10-CM | POA: Diagnosis not present

## 2023-07-14 LAB — COMPREHENSIVE METABOLIC PANEL
ALT: 22 U/L (ref 0–44)
AST: 16 U/L (ref 15–41)
Albumin: 3.4 g/dL — ABNORMAL LOW (ref 3.5–5.0)
Alkaline Phosphatase: 87 U/L (ref 38–126)
Anion gap: 7 (ref 5–15)
BUN: 16 mg/dL (ref 6–20)
CO2: 27 mmol/L (ref 22–32)
Calcium: 9 mg/dL (ref 8.9–10.3)
Chloride: 105 mmol/L (ref 98–111)
Creatinine, Ser: 0.94 mg/dL (ref 0.44–1.00)
GFR, Estimated: >60 mL/min (ref >60)
Glucose, Bld: 70 mg/dL (ref 70–99)
Potassium: 4.3 mmol/L (ref 3.5–5.1)
Sodium: 139 mmol/L (ref 135–145)
Total Bilirubin: 0.3 mg/dL (ref 0.3–1.2)
Total Protein: 6.2 g/dL — ABNORMAL LOW (ref 6.5–8.1)

## 2023-07-14 MED ORDER — AMLODIPINE BESYLATE 10 MG PO TABS: 10.0000 mg | ORAL_TABLET | Freq: Every day | ORAL | 0 refills | Status: DC

## 2023-07-14 NOTE — Group Note (Signed)
Group Topic: Change and Accountability  Group Date: 07/14/2023 Start Time: 1930 End Time: 2000 Facilitators: Hilma Favors, RN  Department: [facility]  Number of Participants: 2  Group Focus: personal responsibility Treatment Modality:  Behavior Modification Therapy Interventions utilized were support Purpose: relapse prevention strategies  Name: [name] Date of Birth: 1971-02-19  MR: [id number]    Level of Participation: 2 Quality of Participation: cooperative Interactions with others: gave feedback Mood/Affect: appropriate Triggers (if applicable):  Cognition: coherent/clear Progress: minimal Response:  Plan: patient will be encouraged to attend groups  Patients Problems:  Patient Active Problem List   Diagnosis Date Noted   H/O: CVA (cerebrovascular accident) 07/11/2023   Substance induced mood disorder (HCC) 07/09/2023   History of trichomoniasis 10/08/2022   Recurrent major depressive disorder, in remission (HCC) 08/31/2022   Psychophysiological insomnia 08/31/2022   Essential hypertension 08/11/2022   Chronic left shoulder pain 08/11/2022   Chronic bilateral low back pain without sciatica 08/11/2022   Mixed hyperlipidemia 08/11/2022   History of suicide attempt 08/05/2022   Tobacco use disorder 08/05/2022   Cocaine abuse with cocaine-induced mood disorder (HCC) 08/04/2022   MDD (major depressive disorder), recurrent severe, without psychosis (HCC) 03/02/2017

## 2023-07-14 NOTE — Group Note (Deleted)
Group Topic: Change and Accountability  Group Date: 07/14/2023 Start Time: 1930 End Time: 2000 Facilitators: Hilma Favors, RN  Department: [facility]  Number of Participants: 2  Group Focus: personal responsibility Treatment Modality:  Behavior Modification Therapy Interventions utilized were support Purpose: relapse prevention strategies  Name: Alexandra Buckley Date of Birth: [birth year]  MR: [id number]    Level of Participation: did not attend Quality of Participation: N/A Interactions with others: N/A Mood/Affect: N/A Triggers (if applicable):  Cognition: N/A Progress: N/A Response:  Plan: patient will be encouraged to attend groups  Patients Problems:  Patient Active Problem List   Diagnosis Date Noted   H/O: CVA (cerebrovascular accident) 07/11/2023   Substance induced mood disorder (HCC) 07/09/2023   History of trichomoniasis 10/08/2022   Recurrent major depressive disorder, in remission (HCC) 08/31/2022   Psychophysiological insomnia 08/31/2022   Essential hypertension 08/11/2022   Chronic left shoulder pain 08/11/2022   Chronic bilateral low back pain without sciatica 08/11/2022   Mixed hyperlipidemia 08/11/2022   History of suicide attempt 08/05/2022   Tobacco use disorder 08/05/2022   Cocaine abuse with cocaine-induced mood disorder (HCC) 08/04/2022   MDD (major depressive disorder), recurrent severe, without psychosis (HCC) 03/02/2017

## 2023-07-14 NOTE — ED Notes (Signed)
Safe Transport called to transport patient to [facility].

## 2023-07-14 NOTE — ED Notes (Signed)
Patient A&Ox4. Denies intent to harm self/others when asked. Denies SI or A/VH. Patient denies any physical complaints. No acute distress observed. Routine safety checks conducted according to facility protocol. Patient agreed to notify staff if thoughts of harm toward self or others arise. Patient aware that she is being discharged to [facility]. Advised she will need to arrive by 9 am this morning. Pt voiced that she doesn't like change. Encouragement and motivation provided. Will continue to monitor for safety.

## 2023-07-14 NOTE — ED Notes (Signed)
Patient is sleeping. Respirations equal and unlabored, skin warm and dry. No change in assessment or acuity. Routine safety checks conducted according to facility protocol. Will continue to monitor for safety.   

## 2023-07-14 NOTE — ED Notes (Signed)
Patient A&O X 4, ambulatory. Pt discharged in no acute distress. Pt denies SI/HI/AVH upon discharge. AVS given. Patient verbalized understanding of discharge instructions and medications. Patient belongings returned form locker #20 . Pt escorted to he back [facility] by staff for transport by [employer] to [facility]. Safety maintained.

## 2023-08-09 ENCOUNTER — Other Ambulatory Visit: Payer: Self-pay | Admitting: Physician Assistant

## 2023-08-09 ENCOUNTER — Encounter: Payer: Self-pay | Admitting: Physician Assistant

## 2023-08-09 ENCOUNTER — Ambulatory Visit: Payer: Self-pay | Admitting: Physician Assistant

## 2023-08-09 VITALS — BP 135/76 | HR 79 | Ht 62.0 in | Wt 168.0 lb

## 2023-08-09 DIAGNOSIS — F332 Major depressive disorder, recurrent severe without psychotic features: Secondary | ICD-10-CM

## 2023-08-09 DIAGNOSIS — Z8673 Personal history of transient ischemic attack (TIA), and cerebral infarction without residual deficits: Secondary | ICD-10-CM

## 2023-08-09 DIAGNOSIS — F1414 Cocaine abuse with cocaine-induced mood disorder: Secondary | ICD-10-CM

## 2023-08-09 DIAGNOSIS — F1410 Cocaine abuse, uncomplicated: Secondary | ICD-10-CM

## 2023-08-09 DIAGNOSIS — Z8619 Personal history of other infectious and parasitic diseases: Secondary | ICD-10-CM

## 2023-08-09 DIAGNOSIS — B9689 Other specified bacterial agents as the cause of diseases classified elsewhere: Secondary | ICD-10-CM

## 2023-08-09 DIAGNOSIS — I10 Essential (primary) hypertension: Secondary | ICD-10-CM

## 2023-08-09 DIAGNOSIS — F17200 Nicotine dependence, unspecified, uncomplicated: Secondary | ICD-10-CM

## 2023-08-09 DIAGNOSIS — G8929 Other chronic pain: Secondary | ICD-10-CM

## 2023-08-09 DIAGNOSIS — M5489 Other dorsalgia: Secondary | ICD-10-CM

## 2023-08-09 DIAGNOSIS — E782 Mixed hyperlipidemia: Secondary | ICD-10-CM

## 2023-08-09 DIAGNOSIS — R7303 Prediabetes: Secondary | ICD-10-CM

## 2023-08-09 DIAGNOSIS — R350 Frequency of micturition: Secondary | ICD-10-CM

## 2023-08-09 DIAGNOSIS — N760 Acute vaginitis: Secondary | ICD-10-CM

## 2023-08-09 LAB — POCT URINALYSIS DIP (CLINITEK)
Bilirubin, UA: NEGATIVE
Blood, UA: NEGATIVE
Glucose, UA: NEGATIVE mg/dL
Ketones, POC UA: NEGATIVE mg/dL
Leukocytes, UA: NEGATIVE
Nitrite, UA: NEGATIVE
POC PROTEIN,UA: NEGATIVE
Spec Grav, UA: 1.025 (ref 1.010–1.025)
Urobilinogen, UA: 0.2 U/dL
pH, UA: 7 (ref 5.0–8.0)

## 2023-08-09 MED ORDER — MELOXICAM 15 MG PO TABS
15.0000 mg | ORAL_TABLET | Freq: Every day | ORAL | 1 refills | Status: DC
Start: 2023-08-09 — End: 2023-11-29

## 2023-08-09 MED ORDER — CYCLOBENZAPRINE HCL 10 MG PO TABS
10.0000 mg | ORAL_TABLET | Freq: Three times a day (TID) | ORAL | 0 refills | Status: DC | PRN
Start: 2023-08-09 — End: 2023-11-21

## 2023-08-09 MED ORDER — ATORVASTATIN CALCIUM 40 MG PO TABS
40.0000 mg | ORAL_TABLET | Freq: Every day | ORAL | 1 refills | Status: DC
Start: 2023-08-09 — End: 2023-11-28

## 2023-08-09 MED ORDER — ASPIRIN 81 MG PO TBEC: 81.0000 mg | DELAYED_RELEASE_TABLET | Freq: Every day | ORAL | Status: DC

## 2023-08-09 MED ORDER — METRONIDAZOLE 500 MG PO TABS: 500.0000 mg | ORAL_TABLET | Freq: Two times a day (BID) | ORAL | 0 refills | Status: AC

## 2023-08-09 MED ORDER — AMLODIPINE BESYLATE 5 MG PO TABS: 5.0000 mg | ORAL_TABLET | Freq: Every day | ORAL | 1 refills | Status: DC
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
## 2023-08-09 NOTE — Patient Instructions (Addendum)
You are going to reduce amlodipine 5 mg once daily.  I encourage you to please check your blood pressure on a daily basis later in the afternoon, keep a written log and have available for all office visits.  To help with your body aches and muscle spasms, you are going to restart meloxicam 15 mg once daily, and can use Flexeril 10 mg every 8 hours as needed.  You will not take ibuprofen while taking meloxicam.  To help with your vaginal discharge, you are going to take metronidazole twice daily for 7 days.  To help with your urinary frequency, I encourage you to practice bladder training, and Kegel exercises.  We will call you with today's lab results, you will follow-up with the mobile unit in 2 weeks.  Roney Jaffe, PA-C Physician Assistant [facility] [email or web address]   Overactive Bladder, Adult  Overactive bladder is a condition in which a person has a sudden and frequent need to urinate. A person might also leak urine if he or she cannot get to the bathroom fast enough (urinary incontinence). Sometimes, symptoms can interfere with work or social activities. What are the causes? Overactive bladder is associated with poor nerve signals between your bladder and your brain. Your bladder may get the signal to empty before it is full. You may also have very sensitive muscles that make your bladder squeeze too soon. This condition may also be caused by other factors, such as: Medical conditions: Urinary tract infection. Infection of nearby tissues. Prostate enlargement. Bladder stones, inflammation, or tumors. Diabetes. Muscle or nerve weakness, especially from these conditions: A spinal cord injury. Stroke. Multiple sclerosis. Parkinson's disease. Other causes: Surgery on the uterus or urethra. Drinking too much caffeine or alcohol. Certain medicines, especially those that eliminate extra fluid in the body  (diuretics). Constipation. What increases the risk? You may be at greater risk for overactive bladder if you: Are an older adult. Smoke. Are going through menopause. Have prostate problems. Have a neurological disease, such as stroke, dementia, Parkinson's disease, or multiple sclerosis (MS). Eat or drink alcohol, spicy food, caffeine, and other things that irritate the bladder. Are overweight or obese. What are the signs or symptoms? Symptoms of this condition include a sudden, strong urge to urinate. Other symptoms include: Leaking urine. Urinating 8 or more times a day. Waking up to urinate 2 or more times overnight. How is this diagnosed? This condition may be diagnosed based on: Your symptoms and medical history. A physical exam. Blood or urine tests to check for possible causes, such as infection. You may also need to see a health care provider who specializes in urinary tract problems. This is called a urologist. How is this treated? Treatment for overactive bladder depends on the cause of your condition and whether it is mild or severe. Treatment may include: Bladder training, such as: Learning to control the urge to urinate by following a schedule to urinate at regular intervals. Doing Kegel exercises to strengthen the pelvic floor muscles that support your bladder. Special devices, such as: Biofeedback. This uses sensors to help you become aware of your body's signals. Electrical stimulation. This uses electrodes placed inside the body (implanted) or outside the body. These electrodes send gentle pulses of electricity to strengthen the nerves or muscles that control the bladder. Women may use a plastic device, called a pessary, that fits into the vagina and supports the bladder. Medicines, such as: Antibiotics to treat bladder infection. Antispasmodics to stop the bladder from  releasing urine at the wrong time. Tricyclic antidepressants to relax bladder  muscles. Injections of botulinum toxin type A directly into the bladder tissue to relax bladder muscles. Surgery, such as: A device may be implanted to help manage the nerve signals that control urination. An electrode may be implanted to stimulate electrical signals in the bladder. A procedure may be done to change the shape of the bladder. This is done only in very severe cases. Follow these instructions at home: Eating and drinking  Make diet or lifestyle changes recommended by your health care provider. These may include: Drinking fluids throughout the day and not only with meals. Cutting down on caffeine or alcohol. Eating a healthy and balanced diet to prevent constipation. This may include: Choosing foods that are high in fiber, such as beans, whole grains, and fresh fruits and vegetables. Limiting foods that are high in fat and processed sugars, such as fried and sweet foods. Lifestyle  Lose weight if needed. Do not use any products that contain nicotine or tobacco. These include cigarettes, chewing tobacco, and vaping devices, such as e-cigarettes. If you need help quitting, ask your health care provider. General instructions Take over-the-counter and prescription medicines only as told by your health care provider. If you were prescribed an antibiotic medicine, take it as told by your health care provider. Do not stop taking the antibiotic even if you start to feel better. Use any implants or pessary as told by your health care provider. If needed, wear pads to absorb urine leakage. Keep a log to track how much and when you drink, and when you need to urinate. This will help your health care provider monitor your condition. Keep all follow-up visits. This is important. Contact a health care provider if: You have a fever or chills. Your symptoms do not get better with treatment. Your pain and discomfort get worse. You have more frequent urges to urinate. Get help right away  if: You are not able to control your bladder. Summary Overactive bladder refers to a condition in which a person has a sudden and frequent need to urinate. Several conditions may lead to an overactive bladder. Treatment for overactive bladder depends on the cause and severity of your condition. Making lifestyle changes, doing Kegel exercises, keeping a log, and taking medicines can help with this condition. This information is not intended to replace advice given to you by your health care provider. Make sure you discuss any questions you have with your health care provider. Document Revised: 06/10/2020 Document Reviewed: 06/10/2020 Elsevier Patient Education  2024 [employer].

## 2023-08-10 ENCOUNTER — Encounter: Payer: Self-pay | Admitting: Physician Assistant

## 2023-08-10 DIAGNOSIS — R7303 Prediabetes: Secondary | ICD-10-CM | POA: Insufficient documentation

## 2023-08-11 LAB — URINE CULTURE

## 2023-08-12 LAB — SPECIMEN STATUS REPORT

## 2023-08-17 ENCOUNTER — Other Ambulatory Visit: Payer: Self-pay | Admitting: Neurology

## 2023-08-17 ENCOUNTER — Telehealth: Payer: Self-pay | Admitting: Neurology

## 2023-08-17 DIAGNOSIS — I639 Cerebral infarction, unspecified: Secondary | ICD-10-CM

## 2023-08-17 LAB — NUSWAB VAGINITIS PLUS (VG+)
Atopobium vaginae: HIGH {score} — AB
BVAB 2: HIGH {score} — AB
Candida albicans, NAA: NEGATIVE
Candida glabrata, NAA: NEGATIVE
Megasphaera 1: HIGH {score} — AB
Trich vag by NAA: NEGATIVE

## 2023-08-17 LAB — SPECIMEN STATUS REPORT

## 2023-09-15 ENCOUNTER — Telehealth: Payer: Self-pay | Admitting: Neurology

## 2023-09-15 NOTE — Telephone Encounter (Signed)
MYC confirmaiton

## 2023-09-17 ENCOUNTER — Ambulatory Visit: Payer: Self-pay | Admitting: Neurology

## 2023-09-17 ENCOUNTER — Encounter: Payer: Self-pay | Admitting: Neurology

## 2023-11-21 ENCOUNTER — Other Ambulatory Visit: Payer: Self-pay

## 2023-11-21 ENCOUNTER — Ambulatory Visit (HOSPITAL_COMMUNITY)
Admission: EM | Admit: 2023-11-21 | Discharge: 2023-11-21 | Disposition: A | Discharge status: Other Acute Inpatient Hospital | Payer: MEDICAID | Attending: Psychiatry | Admitting: Psychiatry

## 2023-11-21 ENCOUNTER — Encounter (HOSPITAL_COMMUNITY): Payer: Self-pay | Admitting: Psychiatry

## 2023-11-21 ENCOUNTER — Inpatient Hospital Stay (HOSPITAL_COMMUNITY)
Admission: AD | Admit: 2023-11-21 | Discharge: 2023-11-29 | DRG: 885 | Disposition: A | Discharge status: Home / Self Care | Payer: MEDICAID | Source: Intra-hospital | Attending: Psychiatry | Admitting: Psychiatry

## 2023-11-21 DIAGNOSIS — Z79899 Other long term (current) drug therapy: Secondary | ICD-10-CM

## 2023-11-21 DIAGNOSIS — I10 Essential (primary) hypertension: Secondary | ICD-10-CM | POA: Diagnosis present

## 2023-11-21 DIAGNOSIS — Z813 Family history of other psychoactive substance abuse and dependence: Secondary | ICD-10-CM

## 2023-11-21 DIAGNOSIS — Z5982 Transportation insecurity: Secondary | ICD-10-CM | POA: Diagnosis not present

## 2023-11-21 DIAGNOSIS — Z5941 Food insecurity: Secondary | ICD-10-CM | POA: Diagnosis not present

## 2023-11-21 DIAGNOSIS — Z5900 Homelessness unspecified: Secondary | ICD-10-CM | POA: Diagnosis not present

## 2023-11-21 DIAGNOSIS — F1413 Cocaine abuse, unspecified with withdrawal: Secondary | ICD-10-CM | POA: Diagnosis present

## 2023-11-21 DIAGNOSIS — Z5948 Other specified lack of adequate food: Secondary | ICD-10-CM

## 2023-11-21 DIAGNOSIS — R45851 Suicidal ideations: Secondary | ICD-10-CM | POA: Diagnosis present

## 2023-11-21 DIAGNOSIS — Z9151 Personal history of suicidal behavior: Secondary | ICD-10-CM | POA: Diagnosis not present

## 2023-11-21 DIAGNOSIS — Z7982 Long term (current) use of aspirin: Secondary | ICD-10-CM | POA: Diagnosis not present

## 2023-11-21 DIAGNOSIS — F332 Major depressive disorder, recurrent severe without psychotic features: Principal | ICD-10-CM | POA: Diagnosis present

## 2023-11-21 DIAGNOSIS — E782 Mixed hyperlipidemia: Secondary | ICD-10-CM

## 2023-11-21 DIAGNOSIS — F1414 Cocaine abuse with cocaine-induced mood disorder: Secondary | ICD-10-CM | POA: Insufficient documentation

## 2023-11-21 DIAGNOSIS — Z560 Unemployment, unspecified: Secondary | ICD-10-CM

## 2023-11-21 DIAGNOSIS — E785 Hyperlipidemia, unspecified: Secondary | ICD-10-CM | POA: Diagnosis present

## 2023-11-21 DIAGNOSIS — Z635 Disruption of family by separation and divorce: Secondary | ICD-10-CM

## 2023-11-21 HISTORY — DX: Essential (primary) hypertension: I10

## 2023-11-21 HISTORY — DX: Cerebral infarction, unspecified: I63.9

## 2023-11-21 HISTORY — DX: Other psychoactive substance abuse, uncomplicated: F19.10

## 2023-11-21 HISTORY — DX: Homelessness unspecified: Z59.00

## 2023-11-21 LAB — URINALYSIS, COMPLETE (UACMP) WITH MICROSCOPIC
Bilirubin Urine: NEGATIVE
Glucose, UA: NEGATIVE mg/dL
Hgb urine dipstick: NEGATIVE
Ketones, ur: NEGATIVE mg/dL
Nitrite: NEGATIVE
Protein, ur: 30 mg/dL — AB
Specific Gravity, Urine: 1.021 (ref 1.005–1.030)
pH: 6 (ref 5.0–8.0)

## 2023-11-21 LAB — COMPREHENSIVE METABOLIC PANEL
ALT: 19 U/L (ref 0–44)
AST: 17 U/L (ref 15–41)
Albumin: 3.9 g/dL (ref 3.5–5.0)
Alkaline Phosphatase: 80 U/L (ref 38–126)
Anion gap: 13 (ref 5–15)
BUN: 13 mg/dL (ref 6–20)
CO2: 29 mmol/L (ref 22–32)
Calcium: 9.3 mg/dL (ref 8.9–10.3)
Chloride: 104 mmol/L (ref 98–111)
Creatinine, Ser: 0.8 mg/dL (ref 0.44–1.00)
GFR, Estimated: >60 mL/min (ref >60)
Glucose, Bld: 96 mg/dL (ref 70–99)
Potassium: 3.7 mmol/L (ref 3.5–5.1)
Sodium: 146 mmol/L — ABNORMAL HIGH (ref 135–145)
Total Bilirubin: 0.4 mg/dL (ref 0.0–1.2)
Total Protein: 6.6 g/dL (ref 6.5–8.1)

## 2023-11-21 LAB — CBC WITH DIFFERENTIAL/PLATELET
Abs Immature Granulocytes: 0.02 10*3/uL (ref 0.00–0.07)
Basophils Absolute: 0 10*3/uL (ref 0.0–0.1)
Basophils Relative: 1 %
Eosinophils Absolute: 0.1 10*3/uL (ref 0.0–0.5)
Eosinophils Relative: 1 %
HCT: 44.1 % (ref 36.0–46.0)
Hemoglobin: 14 g/dL (ref 12.0–15.0)
Immature Granulocytes: 0 %
Lymphocytes Relative: 31 %
Lymphs Abs: 2 10*3/uL (ref 0.7–4.0)
MCH: 28.5 pg (ref 26.0–34.0)
MCHC: 31.7 g/dL (ref 30.0–36.0)
MCV: 89.8 fL (ref 80.0–100.0)
Monocytes Absolute: 0.4 10*3/uL (ref 0.1–1.0)
Monocytes Relative: 6 %
Neutro Abs: 4 10*3/uL (ref 1.7–7.7)
Neutrophils Relative %: 61 %
Platelets: 259 10*3/uL (ref 150–400)
RBC: 4.91 MIL/uL (ref 3.87–5.11)
RDW: 14 % (ref 11.5–15.5)
WBC: 6.5 10*3/uL (ref 4.0–10.5)
nRBC: 0 % (ref 0.0–0.2)

## 2023-11-21 LAB — POCT URINE DRUG SCREEN - MANUAL ENTRY (I-SCREEN)
POC Amphetamine UR: NOT DETECTED
POC Buprenorphine (BUP): NOT DETECTED
POC Cocaine UR: POSITIVE — AB
POC Marijuana UR: NOT DETECTED
POC Methadone UR: NOT DETECTED
POC Methamphetamine UR: NOT DETECTED
POC Morphine: NOT DETECTED
POC Oxazepam (BZO): NOT DETECTED
POC Oxycodone UR: NOT DETECTED
POC Secobarbital (BAR): NOT DETECTED

## 2023-11-21 LAB — LIPID PANEL
Cholesterol: 205 mg/dL — ABNORMAL HIGH (ref 0–200)
HDL: 63 mg/dL (ref >40)
LDL Cholesterol: 122 mg/dL — ABNORMAL HIGH (ref 0–99)
Total CHOL/HDL Ratio: 3.3 {ratio}
Triglycerides: 99 mg/dL (ref <150)
VLDL: 20 mg/dL (ref 0–40)

## 2023-11-21 LAB — POCT PREGNANCY, URINE: Preg Test, Ur: NEGATIVE

## 2023-11-21 LAB — VITAMIN B12: Vitamin B-12: 306 pg/mL (ref 180–914)

## 2023-11-21 LAB — MAGNESIUM: Magnesium: 2.2 mg/dL (ref 1.7–2.4)

## 2023-11-21 LAB — TSH: TSH: 0.723 u[IU]/mL (ref 0.350–4.500)

## 2023-11-21 LAB — T4, FREE: Free T4: 0.88 ng/dL (ref 0.61–1.12)

## 2023-11-21 LAB — ETHANOL: Alcohol, Ethyl (B): <10 mg/dL (ref <10)

## 2023-11-21 MED ORDER — DIPHENHYDRAMINE HCL 50 MG PO CAPS: 50.0000 mg | ORAL_CAPSULE | Freq: Three times a day (TID) | ORAL | Status: DC | PRN

## 2023-11-21 MED ORDER — HYDROXYZINE HCL 25 MG PO TABS
25.0000 mg | ORAL_TABLET | Freq: Three times a day (TID) | ORAL | Status: DC | PRN
  Administered 2023-11-22 – 2023-11-24 (×4): 25 mg via ORAL
  Filled 2023-11-21 (×4): qty 1

## 2023-11-21 MED ORDER — ACETAMINOPHEN 325 MG PO TABS: 650.0000 mg | ORAL_TABLET | Freq: Four times a day (QID) | ORAL | Status: DC | PRN

## 2023-11-21 MED ORDER — SERTRALINE HCL 50 MG PO TABS
50.0000 mg | ORAL_TABLET | Freq: Every day | ORAL | Status: DC
Start: 2023-11-21 — End: 2023-11-21
  Administered 2023-11-21: 50 mg via ORAL
  Filled 2023-11-21: qty 1

## 2023-11-21 MED ORDER — ALUM & MAG HYDROXIDE-SIMETH 200-200-20 MG/5ML PO SUSP: 30.0000 mL | ORAL | Status: DC | PRN

## 2023-11-21 MED ORDER — HALOPERIDOL 5 MG PO TABS: 5.0000 mg | ORAL_TABLET | Freq: Three times a day (TID) | ORAL | Status: DC | PRN

## 2023-11-21 MED ORDER — HALOPERIDOL LACTATE 5 MG/ML IJ SOLN: 5.0000 mg | Freq: Three times a day (TID) | INTRAMUSCULAR | Status: DC | PRN

## 2023-11-21 MED ORDER — DIPHENHYDRAMINE HCL 50 MG/ML IJ SOLN: 50.0000 mg | Freq: Three times a day (TID) | INTRAMUSCULAR | Status: DC | PRN

## 2023-11-21 MED ORDER — LORAZEPAM 2 MG/ML IJ SOLN: 2.0000 mg | Freq: Three times a day (TID) | INTRAMUSCULAR | Status: DC | PRN

## 2023-11-21 MED ORDER — LORAZEPAM 1 MG PO TABS
1.0000 mg | ORAL_TABLET | ORAL | Status: DC | PRN
  Administered 2023-11-22 – 2023-11-23 (×2): 1 mg via ORAL
  Filled 2023-11-21 (×2): qty 1

## 2023-11-21 MED ORDER — HALOPERIDOL LACTATE 5 MG/ML IJ SOLN: 10.0000 mg | Freq: Three times a day (TID) | INTRAMUSCULAR | Status: DC | PRN

## 2023-11-21 MED ORDER — TRAZODONE HCL 50 MG PO TABS: 50.0000 mg | ORAL_TABLET | Freq: Every evening | ORAL | Status: DC | PRN

## 2023-11-21 MED ORDER — ACETAMINOPHEN 325 MG PO TABS
650.0000 mg | ORAL_TABLET | Freq: Four times a day (QID) | ORAL | Status: DC | PRN
  Administered 2023-11-23 – 2023-11-26 (×3): 650 mg via ORAL
  Filled 2023-11-21 (×3): qty 2

## 2023-11-21 MED ORDER — MAGNESIUM HYDROXIDE 400 MG/5ML PO SUSP: 30.0000 mL | Freq: Every day | ORAL | Status: DC | PRN

## 2023-11-21 MED ORDER — HYDROXYZINE HCL 25 MG PO TABS: 25.0000 mg | ORAL_TABLET | Freq: Three times a day (TID) | ORAL | Status: DC | PRN

## 2023-11-21 NOTE — ED Notes (Addendum)
 Patient admitted to obs. Patient cooperative with admission process and cooperative in unit. Patient endorses SI and auditory hallucinations. Patient states she hears voices that she talks back to but does not go into detail. Patient denies visual hallucinations and HI. Patient able to contract for safety while on unit. Patients clothes put to wash and patient provided with meal. Patient aware of pending transfer to [facility] and aware that she will be having a roommate. Patient verbalized all understanding. No s/s of current distress.

## 2023-11-21 NOTE — Tx Team (Signed)
 Initial Treatment Plan 11/21/2023 6:32 PM Alexandra Buckley JYN:[id number]    PATIENT STRESSORS: Financial difficulties   Medication change or noncompliance   Substance abuse     PATIENT STRENGTHS: Printmaker for treatment/growth  Supportive family/friends    PATIENT IDENTIFIED PROBLEMS: Substance abuse   Suicidal thoughts                    DISCHARGE CRITERIA:  Improved stabilization in mood, thinking, and/or behavior Need for constant or close observation no longer present  PRELIMINARY DISCHARGE PLAN: Return to previous living arrangement  PATIENT/FAMILY INVOLVEMENT: This treatment plan has been presented to and reviewed with the patient, Alexandra Buckley.  The patient has been given the opportunity to ask questions and make suggestions.  Edwyna Perfect, RN 11/21/2023, 6:32 PM
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
## 2023-11-21 NOTE — Discharge Instructions (Addendum)
 Transfer to [facility] for inpatient psychiatric admission, Dr. Florene Route is the excepting MD
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
## 2023-11-21 NOTE — Progress Notes (Signed)
 BHH/BMU LCSW Progress Note   11/21/2023    1:59 PM  Alexandra Buckley   [id number]   Type of Contact and Topic:  Psychiatric Bed Placement   Pt accepted to [facility] 405-1    Patient meets inpatient criteria per Vernard Gambles, NP   The attending provider will be Dr. Sherron Flemings   Call report to [phone]    Fortunato Curling, LPN @ [facility] notified.     Pt scheduled  to arrive at [facility] TODAY.    Damita Dunnings, MSW, LCSW-A  2:00 PM 11/21/2023

## 2023-11-21 NOTE — Progress Notes (Signed)
   11/21/23 1038  [facility] Triage Screening (Walk-ins at [facility] only)  How Did You Hear About [place]? Self  What Is the Reason for Your Visit/Call Today? Alexandra Buckley is a 53 year old female presenting to [facility] unaccompanied. Pt reports that her world is falling apart. Pt states, "I question why I am here sometimes". However, pt denies feeling sucidal at this time. Pt mentions that she has used cocaine (over a gram) in the past 24 hrs. Pt reports she has been using off and on since 1998. Pt reports daily use of cocaine. Pt reports she is looking to be admitted today for her ongoing cocaine addiction. Pt denies alcohol use, Si, Hi and Avh.  How Long Has This Been Causing You Problems? > than 6 months  Have You Recently Had Any Thoughts About Hurting Yourself? No  Are You Planning to Commit Suicide/Harm Yourself At This time? No  Have you Recently Had Thoughts About Hurting Someone Karolee Ohs? No  Are You Planning To Harm Someone At This Time? No  Physical Abuse Denies  Verbal Abuse Denies  Sexual Abuse Denies  Exploitation of patient/patient's resources Denies  Self-Neglect Denies  Possible abuse reported to: Other (Comment)  Are you currently experiencing any auditory, visual or other hallucinations? No  Have You Used Any Alcohol or Drugs in the Past 24 Hours? Yes  What Did You Use and How Much? over a gram of cocaine  Do you have any current medical co-morbidities that require immediate attention? No  Clinician description of patient physical appearance/behavior: tearful, cooperative  What Do You Feel Would Help You the Most Today? Housing Assistance;Alcohol or Drug Use Treatment  If access to [facility]gent [facility] was not available, would you have sought care in the Emergency Department? No  Determination of Need Urgent (48 hours)  Options For Referral Facility-Based Crisis  Determination of Need filed? Yes

## 2023-11-22 ENCOUNTER — Encounter (HOSPITAL_COMMUNITY): Payer: Self-pay

## 2023-11-22 LAB — HEMOGLOBIN A1C
Hgb A1c MFr Bld: 5.8 % — ABNORMAL HIGH (ref 4.8–5.6)
Mean Plasma Glucose: 120 mg/dL

## 2023-11-22 MED ORDER — SERTRALINE HCL 50 MG PO TABS
50.0000 mg | ORAL_TABLET | Freq: Every day | ORAL | Status: DC
  Administered 2023-11-23 – 2023-11-24 (×2): 50 mg via ORAL
  Filled 2023-11-22 (×3): qty 1

## 2023-11-22 MED ORDER — ASPIRIN 81 MG PO TBEC
81.0000 mg | DELAYED_RELEASE_TABLET | Freq: Every day | ORAL | Status: DC
  Administered 2023-11-22 – 2023-11-28 (×7): 81 mg via ORAL
  Filled 2023-11-22: qty 1
  Filled 2023-11-22: qty 14
  Filled 2023-11-22: qty 1
  Filled 2023-11-22: qty 14
  Filled 2023-11-22 (×3): qty 1
  Filled 2023-11-22 (×2): qty 14
  Filled 2023-11-22 (×2): qty 1

## 2023-11-22 MED ORDER — DICYCLOMINE HCL 20 MG PO TABS: 20.0000 mg | ORAL_TABLET | Freq: Four times a day (QID) | ORAL | Status: AC | PRN

## 2023-11-22 MED ORDER — NAPROXEN 500 MG PO TABS
500.0000 mg | ORAL_TABLET | Freq: Two times a day (BID) | ORAL | Status: DC | PRN
  Administered 2023-11-22: 500 mg via ORAL
  Filled 2023-11-22: qty 1

## 2023-11-22 MED ORDER — HALOPERIDOL 5 MG PO TABS: 5.0000 mg | ORAL_TABLET | Freq: Three times a day (TID) | ORAL | Status: DC | PRN

## 2023-11-22 MED ORDER — DIPHENHYDRAMINE HCL 50 MG/ML IJ SOLN: 50.0000 mg | Freq: Three times a day (TID) | INTRAMUSCULAR | Status: DC | PRN

## 2023-11-22 MED ORDER — LOPERAMIDE HCL 2 MG PO CAPS: 2.0000 mg | ORAL_CAPSULE | ORAL | Status: AC | PRN

## 2023-11-22 MED ORDER — METHOCARBAMOL 500 MG PO TABS
500.0000 mg | ORAL_TABLET | Freq: Three times a day (TID) | ORAL | Status: AC | PRN
  Administered 2023-11-23 – 2023-11-25 (×4): 500 mg via ORAL
  Filled 2023-11-22 (×4): qty 1

## 2023-11-22 MED ORDER — ATORVASTATIN CALCIUM 40 MG PO TABS
40.0000 mg | ORAL_TABLET | Freq: Every day | ORAL | Status: DC
  Administered 2023-11-22 – 2023-11-28 (×7): 40 mg via ORAL
  Filled 2023-11-22: qty 1
  Filled 2023-11-22: qty 14
  Filled 2023-11-22 (×6): qty 1
  Filled 2023-11-22 (×3): qty 14

## 2023-11-22 MED ORDER — LORAZEPAM 2 MG/ML IJ SOLN: 2.0000 mg | Freq: Three times a day (TID) | INTRAMUSCULAR | Status: DC | PRN

## 2023-11-22 MED ORDER — CLONIDINE HCL 0.1 MG PO TABS
0.1000 mg | ORAL_TABLET | Freq: Three times a day (TID) | ORAL | Status: DC | PRN
  Administered 2023-11-23: 0.1 mg via ORAL
  Filled 2023-11-22: qty 1

## 2023-11-22 MED ORDER — ONDANSETRON 4 MG PO TBDP: 4.0000 mg | ORAL_TABLET | Freq: Four times a day (QID) | ORAL | Status: AC | PRN

## 2023-11-22 MED ORDER — AMLODIPINE BESYLATE 5 MG PO TABS
5.0000 mg | ORAL_TABLET | Freq: Every day | ORAL | Status: DC
  Administered 2023-11-22 – 2023-11-23 (×2): 5 mg via ORAL
  Filled 2023-11-22 (×4): qty 1

## 2023-11-22 MED ORDER — HALOPERIDOL LACTATE 5 MG/ML IJ SOLN: 5.0000 mg | Freq: Three times a day (TID) | INTRAMUSCULAR | Status: DC | PRN

## 2023-11-22 MED ORDER — DIPHENHYDRAMINE HCL 25 MG PO CAPS: 50.0000 mg | ORAL_CAPSULE | Freq: Three times a day (TID) | ORAL | Status: DC | PRN

## 2023-11-22 MED ORDER — SERTRALINE HCL 25 MG PO TABS
25.0000 mg | ORAL_TABLET | Freq: Once | ORAL | Status: AC
  Administered 2023-11-22: 25 mg via ORAL
  Filled 2023-11-22 (×2): qty 1

## 2023-11-22 NOTE — Plan of Care (Signed)
  Problem: Coping: Goal: Ability to verbalize frustrations and anger appropriately will improve Outcome: Progressing   Problem: Education: Goal: Knowledge of [facility] Education information/materials will improve Outcome: Not Met: Pt resting in bed; does not appear to retain information Goal: Verbalization of understanding the information provided will improve Outcome: Not Met: Pt resting in bed; does not appear to retain information   Problem: Activity: Goal: Interest or engagement in activities will improve Outcome: Not Met: Pt resting in bed; does not appear to retain information

## 2023-11-22 NOTE — [facility] Suicide Risk Assessment (Signed)
 Suicide Risk Assessment  Admission Assessment    [facility] Admission Suicide Risk Assessment   Nursing information obtained from:  Patient Demographic factors:  Low socioeconomic status, Unemployed Current Mental Status:  Suicidal ideation indicated by patient Loss Factors:  Decline in physical health, Financial problems / change in socioeconomic status Historical Factors:  Family history of mental illness or substance abuse Risk Reduction Factors:  Positive social support  Total Time spent with patient: 45 minutes Principal Problem: MDD (major depressive disorder), recurrent severe, without psychosis (HCC) Diagnosis:  Principal Problem:   MDD (major depressive disorder), recurrent severe, without psychosis (HCC) Active Problems:   Essential hypertension  Subjective Data:   Alexandra Buckley is a 53 yr old female who presented on 2/16 to [facility] with SI worsened by her substance abuse, she was admitted to [facility] on 2/17.  PPHx is significant for MDD and Cocaine Use Disorder, and 2 Suicide Attempts (last- Ingestion 08/2022) and 2 Prior Psychiatric Hospitalizations, and no history of Self Injurious Behavior.   She was very tired throughout the interview falling asleep multiple times and needing to be reawoken.   She reports that she was admitted to the hospital because she felt like giving up on life.  She reports she has been feeling this for several months.  When asked she reports she has been having suicidal thoughts for several months.  She reports that she has plans of walking into the ocean to drown herself because she is afraid of the water.   She reports past psychiatric history significant for depression and cocaine abuse.  She reports a history of 2 suicide attempts both via ingestion.  She reports no history of self-injurious behavior.  She reports 2 prior psychiatric hospitalizations.  She reports a past medical history significant for stroke (05/2023), hypertension, and hyperlipidemia.  She  reports a past surgical history significant for multiple surgeries to both of her arms and her right leg.  She reports no history of seizures.  She reports no history of head trauma.  She reports NKDA.     She reports she is currently unhoused.  She reports she is not currently working.  She reports no alcohol use.  She reports smoking 1/3 pack/day of cigarettes.  She reports smoking crack cocaine daily and estimates about a gram per day.  She reports no access to firearms.  She reports no current legal issues.   When asked she reports that Zoloft was helpful for her in the past.  Discussed to restarting it and she was agreeable with this.  She reports not feeling well due to her blood pressure being elevated.  She reports not being on her blood pressure medicine for a while but does not remember the name of it.  Discussed with her that we would look up what she was on in the chart and restart it and she was agreeable with this.  She reports she still has SI but reports no HI or AVH.  She reports no other concerns present.  Continued Clinical Symptoms:  Alcohol Use Disorder Identification Test Final Score (AUDIT): 0 The "Alcohol Use Disorders Identification Test", Guidelines for Use in Primary Care, Second Edition.  World Science writer [facility]). Score between 0-7:  no or low risk or alcohol related problems. Score between 8-15:  moderate risk of alcohol related problems. Score between 16-19:  high risk of alcohol related problems. Score 20 or above:  warrants further diagnostic evaluation for alcohol dependence and treatment.   CLINICAL FACTORS:  Depression:   Hopelessness Severe Alcohol/Substance Abuse/Dependencies More than one psychiatric diagnosis Unstable or Poor Therapeutic Relationship Previous Psychiatric Diagnoses and Treatments Medical Diagnoses and Treatments/Surgeries   Musculoskeletal: Strength & Muscle Tone: within normal limits Gait & Station:  laying in bed Patient  leans: N/A  Psychiatric Specialty Exam:  Presentation  General Appearance:  Disheveled  Eye Contact: Poor  Speech: Clear and Coherent; Slow  Speech Volume: Normal  Handedness: Right   Mood and Affect  Mood: Anxious; Depressed; Hopeless  Affect: Depressed; Constricted; Congruent   Thought Process  Thought Processes: Coherent  Descriptions of Associations:Intact  Orientation:Full (Time, Place and Person)  Thought Content:Logical  History of Schizophrenia/Schizoaffective disorder:No  Duration of Psychotic Symptoms:No data recorded Hallucinations:Hallucinations: None  Ideas of Reference:None  Suicidal Thoughts:Suicidal Thoughts: Yes, Active SI Active Intent and/or Plan: Without Means to Carry Out  Homicidal Thoughts:Homicidal Thoughts: No   Sensorium  Memory: Immediate Fair; Recent Fair  Judgment: Fair  Insight: Fair   Executive Functions  Concentration: Poor  Attention Span: Poor  Recall: [employer] of Knowledge: Fair  Language: Fair   Psychomotor Activity  Psychomotor Activity: Psychomotor Activity: Normal   Assets  Assets: Manufacturing systems engineer; Desire for Improvement; Resilience   Sleep  Sleep: Sleep: Fair Number of Hours of Sleep: 2    Physical Exam: Physical Exam Vitals and nursing note reviewed.  Constitutional:      General: She is not in acute distress.    Appearance: Normal appearance. She is normal weight. She is not ill-appearing or toxic-appearing.  HENT:     Head: Normocephalic and atraumatic.  Pulmonary:     Effort: Pulmonary effort is normal.  Neurological:     Mental Status: She is alert.    Review of Systems  Psychiatric/Behavioral:  Positive for depression and suicidal ideas (Active wthout means). Negative for hallucinations. The patient is not nervous/anxious.    Blood pressure (!) 158/58, pulse 67, temperature 98.6 F (37 C), temperature source Oral, resp. rate 20, height 5\' 2"  (1.575  m), weight 67 kg, last menstrual period 11/18/2018, SpO2 98%. Body mass index is 27 kg/m.   COGNITIVE FEATURES THAT CONTRIBUTE TO RISK:  Closed-mindedness and Loss of executive function    SUICIDE RISK:   Severe:  Frequent, intense, and enduring suicidal ideation, specific plan, no subjective intent, but some objective markers of intent (i.e., choice of lethal method), the method is accessible, some limited preparatory behavior, evidence of impaired self-control, severe dysphoria/symptomatology, multiple risk factors present, and few if any protective factors, particularly a lack of social support.  PLAN OF CARE:   Blair Mesina is a 53 yr old female who presented on 2/16 to [facility] with SI worsened by her substance abuse, she was admitted to [facility] on 2/17.  PPHx is significant for MDD and Cocaine Use Disorder, and 2 Suicide Attempts (last- Ingestion 08/2022) and 2 Prior Psychiatric Hospitalizations, and no history of Self Injurious Behavior.     Tatanisha has a long documented history of MDD and is not showing any signs of psychosis or mania.  Since she reports benefit from Zoloft in the past we will restart it.  She is having elevated BP so will restart her Amlodipine and start PRN Clonidine.  She would benefit from further substance abuse treatment as most of her symptoms do stem from this so will recommend residential rehab and appreciate [employer] assistance with this.  We will not make any other changes to her medications.  We will continue to monitor.  MDD, Recurrent, Severe, w/out Psychosis: -Start Zoloft 25 mg daily and increase to 50 mg tomorrow for depression -Start Agitation Protocol: Haldol/Benadryl/Ativan     Withdrawal: -Start COWS -Start  Imodium 2-4 mg PRN diarrhea -Start  Robaxin 500 mg q8 PRN muscle spasms -Start  Naproxen 500 mg BID PRN pain -Start  Zofran-ODT 4 mg q6 PRN nausea -Continue Bentyl 20 mg q6 PRN spasms     HTN: -Restart Amlodipine 5 mg daily -Start  Clonidine 0.1 mg TID PRN Administer one dose if SBP is over 180 -or- if DBP is over 100      -Restart Lipitor 40 mg QHS -Restart Aspirin EC 81 mg daily -Continue PRN's: Tylenol, Maalox, Atarax, Milk of Magnesia  I certify that inpatient services furnished can reasonably be expected to improve the patient's condition.   Lauro Franklin, MD 11/22/2023, 12:19 PM

## 2023-11-22 NOTE — [facility] Group Notes (Signed)
 Adult Psychoeducational Group Note  Date:  11/22/2023 Time:  4:56 AM  Group Topic/Focus:  Wrap-Up Group:   The focus of this group is to help patients review their daily goal of treatment and discuss progress on daily workbooks.  Participation Level:  Did Not Attend  Participation Quality:   n/a  Affect:   n/a  Cognitive:   n/a  Insight: None  Engagement in Group:   n/a  Modes of Intervention:   n/a  Additional Comments:  Anetta did not attend wrap up group  Charna Busman Long 11/22/2023, 4:56 AM

## 2023-11-22 NOTE — Progress Notes (Signed)
   11/22/23 0900  Psych Admission Type (Psych Patients Only)  Admission Status Voluntary  Psychosocial Assessment  Patient Complaints Anxiety;Depression  Eye Contact Brief  Facial Expression Sullen  Affect Depressed  Speech Slow  Interaction Avoidant  Motor Activity Slow  Appearance/Hygiene Disheveled  Behavior Characteristics Appropriate to situation  Mood Depressed  Thought Process  Coherency WDL  Content WDL  Delusions None reported or observed  Perception WDL  Hallucination None reported or observed  Judgment Poor  Confusion Mild  Danger to Self  Current suicidal ideation? Denies  Self-Injurious Behavior No self-injurious ideation or behavior indicators observed or expressed   Agreement Not to Harm Self No  Danger to Others  Danger to Others None reported or observed

## 2023-11-22 NOTE — Plan of Care (Signed)
   Problem: Activity: Goal: Interest or engagement in activities will improve Outcome: Progressing   Problem: Coping: Goal: Ability to verbalize frustrations and anger appropriately will improve Outcome: Progressing   Problem: Safety: Goal: Periods of time without injury will increase Outcome: Progressing

## 2023-11-22 NOTE — BH IP Treatment Plan (Signed)
 Interdisciplinary Treatment and Diagnostic Plan Update  11/22/2023 Time of Session: 11:59 Alexandra Buckley MRN: [id number]  Principal Diagnosis: MDD (major depressive disorder), recurrent severe, without psychosis (HCC)  Secondary Diagnoses: Principal Problem:   MDD (major depressive disorder), recurrent severe, without psychosis (HCC) Active Problems:   Essential hypertension   Current Medications:  Current Facility-Administered Medications  Medication Dose Route Frequency Provider Last Rate Last Admin   acetaminophen (TYLENOL) tablet 650 mg  650 mg Oral Q6H PRN Ardis Hughs, NP       alum & mag hydroxide-simeth (MAALOX/MYLANTA) 200-200-20 MG/5ML suspension 30 mL  30 mL Oral Q4H PRN Ardis Hughs, NP       amLODipine (NORVASC) tablet 5 mg  5 mg Oral Daily Lauro Franklin, MD   5 mg at 11/22/23 1137   aspirin EC tablet 81 mg  81 mg Oral QHS Lauro Franklin, MD       atorvastatin (LIPITOR) tablet 40 mg  40 mg Oral QHS Lauro Franklin, MD       cloNIDine (CATAPRES) tablet 0.1 mg  0.1 mg Oral TID PRN Lauro Franklin, MD       dicyclomine (BENTYL) tablet 20 mg  20 mg Oral Q6H PRN Lauro Franklin, MD       haloperidol (HALDOL) tablet 5 mg  5 mg Oral TID PRN Lauro Franklin, MD       And   diphenhydrAMINE (BENADRYL) capsule 50 mg  50 mg Oral TID PRN Lauro Franklin, MD       haloperidol lactate (HALDOL) injection 5 mg  5 mg Intramuscular TID PRN Lauro Franklin, MD       And   diphenhydrAMINE (BENADRYL) injection 50 mg  50 mg Intramuscular TID PRN Lauro Franklin, MD       And   LORazepam (ATIVAN) injection 2 mg  2 mg Intramuscular TID PRN Lauro Franklin, MD       hydrOXYzine (ATARAX) tablet 25 mg  25 mg Oral TID PRN Ardis Hughs, NP       loperamide (IMODIUM) capsule 2-4 mg  2-4 mg Oral PRN Lauro Franklin, MD       LORazepam (ATIVAN) tablet 1 mg  1 mg Oral Q4H PRN Ardis Hughs, NP   1 mg at 11/22/23  5284   magnesium hydroxide (MILK OF MAGNESIA) suspension 30 mL  30 mL Oral Daily PRN Ardis Hughs, NP       methocarbamol (ROBAXIN) tablet 500 mg  500 mg Oral Q8H PRN Lauro Franklin, MD       naproxen (NAPROSYN) tablet 500 mg  500 mg Oral BID PRN Lauro Franklin, MD       ondansetron (ZOFRAN-ODT) disintegrating tablet 4 mg  4 mg Oral Q6H PRN Lauro Franklin, MD       Melene Muller ON 11/23/2023] sertraline (ZOLOFT) tablet 50 mg  50 mg Oral Daily [name], [name], MD       PTA Medications: Medications Prior to Admission  Medication Sig Dispense Refill Last Dose/Taking   amLODipine (NORVASC) 5 MG tablet Take 1 tablet (5 mg total) by mouth daily. (Patient not taking: Reported on 11/22/2023) 30 tablet 1 Not Taking   aspirin EC 81 MG tablet Take 1 tablet (81 mg total) by mouth at bedtime. Swallow whole. (Patient not taking: Reported on 11/22/2023)   Not Taking   atorvastatin (LIPITOR) 40 MG tablet Take 1 tablet (40 mg total) by mouth at bedtime. (  Patient not taking: Reported on 11/22/2023) 30 tablet 1 Not Taking   cyclobenzaprine (FLEXERIL) 5 MG tablet Take 5 mg by mouth 3 (three) times daily as needed for muscle spasms. (Patient not taking: Reported on 11/22/2023)   Not Taking   meloxicam (MOBIC) 15 MG tablet Take 1 tablet (15 mg total) by mouth daily. (Patient not taking: Reported on 11/22/2023) 30 tablet 1 Not Taking   traZODone (DESYREL) 100 MG tablet Take 100 mg by mouth at bedtime. (Patient not taking: Reported on 11/22/2023)   Not Taking    Patient Stressors: Financial difficulties   Medication change or noncompliance   Substance abuse    Patient Strengths: Printmaker for treatment/growth  Supportive family/friends   Treatment Modalities: Medication Management, Group therapy, Case management,  1 to 1 session with clinician, Psychoeducation, Recreational therapy.   Physician Treatment Plan for Primary Diagnosis: MDD (major depressive disorder),  recurrent severe, without psychosis (HCC) Long Term Goal(s): Improvement in symptoms so as ready for discharge   Short Term Goals: Ability to identify changes in lifestyle to reduce recurrence of condition will improve Ability to verbalize feelings will improve Ability to disclose and discuss suicidal ideas Ability to demonstrate self-control will improve Ability to identify and develop effective coping behaviors will improve Ability to maintain clinical measurements within normal limits will improve Compliance with prescribed medications will improve Ability to identify triggers associated with substance abuse/mental health issues will improve  Medication Management: Evaluate patient's response, side effects, and tolerance of medication regimen.  Therapeutic Interventions: 1 to 1 sessions, Unit Group sessions and Medication administration.  Evaluation of Outcomes: Not Progressing  Physician Treatment Plan for Secondary Diagnosis: Principal Problem:   MDD (major depressive disorder), recurrent severe, without psychosis (HCC) Active Problems:   Essential hypertension  Long Term Goal(s): Improvement in symptoms so as ready for discharge   Short Term Goals: Ability to identify changes in lifestyle to reduce recurrence of condition will improve Ability to verbalize feelings will improve Ability to disclose and discuss suicidal ideas Ability to demonstrate self-control will improve Ability to identify and develop effective coping behaviors will improve Ability to maintain clinical measurements within normal limits will improve Compliance with prescribed medications will improve Ability to identify triggers associated with substance abuse/mental health issues will improve     Medication Management: Evaluate patient's response, side effects, and tolerance of medication regimen.  Therapeutic Interventions: 1 to 1 sessions, Unit Group sessions and Medication administration.  Evaluation  of Outcomes: Not Progressing   RN Treatment Plan for Primary Diagnosis: MDD (major depressive disorder), recurrent severe, without psychosis (HCC) Long Term Goal(s): Knowledge of disease and therapeutic regimen to maintain health will improve  Short Term Goals: Ability to demonstrate self-control, Ability to participate in decision making will improve, Ability to verbalize feelings will improve, Ability to identify and develop effective coping behaviors will improve, and Compliance with prescribed medications will improve  Medication Management: RN will administer medications as ordered by provider, will assess and evaluate patient's response and provide education to patient for prescribed medication. RN will report any adverse and/or side effects to prescribing provider.  Therapeutic Interventions: 1 on 1 counseling sessions, Psychoeducation, Medication administration, Evaluate responses to treatment, Monitor vital signs and CBGs as ordered, Perform/monitor CIWA, COWS, AIMS and Fall Risk screenings as ordered, Perform wound care treatments as ordered.  Evaluation of Outcomes: Not Progressing   LCSW Treatment Plan for Primary Diagnosis: MDD (major depressive disorder), recurrent severe, without psychosis (HCC) Long Term Goal(s): Safe  transition to appropriate next level of care at discharge, Engage patient in therapeutic group addressing interpersonal concerns.  Short Term Goals: Engage patient in aftercare planning with referrals and resources, Increase social support, Increase ability to appropriately verbalize feelings, Facilitate patient progression through stages of change regarding substance use diagnoses and concerns, Identify triggers associated with mental health/substance abuse issues, and Increase skills for wellness and recovery  Therapeutic Interventions: Assess for all discharge needs, 1 to 1 time with Social worker, Explore available resources and support systems, Assess for  adequacy in community support network, Educate family and significant other(s) on suicide prevention, Complete Psychosocial Assessment, Interpersonal group therapy.  Evaluation of Outcomes: Not Progressing   Progress in Treatment: Attending groups: No. Participating in groups: No. Taking medication as prescribed: Yes. Toleration medication: Yes. Family/Significant other contact made: No, will contact:  CSW assessment and consents pending Patient understands diagnosis: Yes. Discussing patient identified problems/goals with staff: Yes. Medical problems stabilized or resolved: Yes. Denies suicidal/homicidal ideation: Yes. Issues/concerns per patient self-inventory: No. Other: n/a  New problem(s) identified: No, Describe:  None  New Short Term/Long Term Goal(s): detox, medication management for mood stabilization; elimination of SI thoughts; development of comprehensive mental wellness/sobriety plan  Patient Goals:  "Get my mind right"  Discharge Plan or Barriers: Patient recently admitted. CSW will continue to follow and assess for appropriate referrals and possible discharge planning.   Reason for Continuation of Hospitalization: Medication stabilization Withdrawal symptoms Other; describe mood stabilization, discharge planning  Estimated Length of Stay: 5-7 Days  Last 3 [place] Suicide Severity Risk Score: Flowsheet Row [facility]Current[facility] Most recent reading at 11/21/2023  5:40 PM ED from 11/21/2023 in [facility] Most recent reading at 11/21/2023  1:59 PM ED from 07/09/2023 in [facility] Most recent reading at 07/10/2023 12:48 AM  C-SSRS RISK CATEGORY Low Risk Low Risk No Risk       Last PHQ 2/9 Scores:    07/12/2023    3:13 PM 07/09/2023   10:38 AM 08/06/2022    2:06 PM  Depression screen PHQ 2/9  Decreased Interest 1 1 3   Down, Depressed, Hopeless 1  1 1   PHQ - 2 Score 2 2 4   Altered sleeping 2 1 0  Tired, decreased energy 1 1 0  Change in appetite 0 0 1  Feeling bad or failure about yourself  1 1 1   Trouble concentrating 1 1   Moving slowly or fidgety/restless 1 1 2   Suicidal thoughts 0 0 2  PHQ-9 Score 8 7 10   Difficult doing work/chores Very difficult Very difficult Somewhat difficult    Scribe for Treatment Team: Jacinta Shoe, LCSW 11/22/2023 2:57 PM

## 2023-11-22 NOTE — H&P (Addendum)
 Psychiatric Admission Assessment Adult  Patient Identification: Alexandra Buckley MRN:  [id number] Date of Evaluation:  11/22/2023 Chief Complaint:  MDD (major depressive disorder), recurrent severe, without psychosis (HCC) [F33.2] Principal Diagnosis: MDD (major depressive disorder), recurrent severe, without psychosis (HCC) Diagnosis:  Principal Problem:   MDD (major depressive disorder), recurrent severe, without psychosis (HCC) Active Problems:   Essential hypertension  History of Present Illness:   Alexandra Buckley is a 53 yr old female who presented on 2/16 to [facility] with SI worsened by her substance abuse, she was admitted to [facility] on 2/17.  PPHx is significant for MDD and Cocaine Use Disorder, and 2 Suicide Attempts (last- Ingestion 08/2022) and 2 Prior Psychiatric Hospitalizations, and no history of Self Injurious Behavior.  She was very tired throughout the interview falling asleep multiple times and needing to be reawoken.  She reports that she was admitted to the hospital because she felt like giving up on life.  She reports she has been feeling this for several months.  When asked she reports she has been having suicidal thoughts for several months.  She reports that she has plans of walking into the ocean to drown herself because she is afraid of the water.  She reports past psychiatric history significant for depression and cocaine abuse.  She reports a history of 2 suicide attempts both via ingestion.  She reports no history of self-injurious behavior.  She reports 2 prior psychiatric hospitalizations.  She reports a past medical history significant for stroke (05/2023), hypertension, and hyperlipidemia.  She reports a past surgical history significant for multiple surgeries to both of her arms and her right leg.  She reports no history of seizures.  She reports no history of head trauma.  She reports NKDA.    She reports she is currently unhoused.  She reports she is not currently  working.  She reports no alcohol use.  She reports smoking 1/3 pack/day of cigarettes.  She reports smoking crack cocaine daily and estimates about a gram per day.  She reports no access to firearms.  She reports no current legal issues.  When asked she reports that Zoloft was helpful for her in the past.  Discussed to restarting it and she was agreeable with this.  She reports not feeling well due to her blood pressure being elevated.  She reports not being on her blood pressure medicine for a while but does not remember the name of it.  Discussed with her that we would look up what she was on in the chart and restart it and she was agreeable with this.  She reports she still has SI but reports no HI or AVH.  She reports no other concerns present.    Associated Signs/Symptoms: Depression Symptoms:  depressed mood, anhedonia, insomnia, fatigue, feelings of worthlessness/guilt, hopelessness, suicidal thoughts with specific plan, anxiety, loss of energy/fatigue, disturbed sleep, weight loss, decreased appetite, (Hypo) Manic Symptoms:   Reports None Anxiety Symptoms:  Excessive Worry, Psychotic Symptoms:   Reports None PTSD Symptoms: NA Total Time spent with patient: 45 minutes  Past Psychiatric History: MDD and Cocaine Use Disorder, and 2 Suicide Attempts (last- Ingestion 08/2022) and 2 Prior Psychiatric Hospitalizations, and no history of Self Injurious Behavior.  Is the patient at risk to self? Yes.    Has the patient been a risk to self in the past 6 months? Yes.    Has the patient been a risk to self within the distant past? Yes.    Is the patient  a risk to others? No.  Has the patient been a risk to others in the past 6 months? No.  Has the patient been a risk to others within the distant past? No.   [place] Scale:  Flowsheet Row [facility] ([facility] Most recent reading at 11/21/2023  5:40 PM ED from 11/21/2023 in  [facility] Most recent reading at 11/21/2023  1:59 PM ED from 07/09/2023 in [facility] Most recent reading at 07/10/2023 12:48 AM  C-SSRS RISK CATEGORY Low Risk Low Risk No Risk        Prior Inpatient Therapy: Yes.   If yes, describe   Prior Outpatient Therapy: No. If yes, describe N/A   Alcohol Screening: 1. How often do you have a drink containing alcohol?: Never 2. How many drinks containing alcohol do you have on a typical day when you are drinking?: 1 or 2 3. How often do you have six or more drinks on one occasion?: Never AUDIT-C Score: 0 4. How often during the last year have you found that you were not able to stop drinking once you had started?: Never 5. How often during the last year have you failed to do what was normally expected from you because of drinking?: Never 6. How often during the last year have you needed a first drink in the morning to get yourself going after a heavy drinking session?: Never 7. How often during the last year have you had a feeling of guilt of remorse after drinking?: Never 8. How often during the last year have you been unable to remember what happened the night before because you had been drinking?: Never 9. Have you or someone else been injured as a result of your drinking?: No 10. Has a relative or friend or a doctor or another health worker been concerned about your drinking or suggested you cut down?: No Alcohol Use Disorder Identification Test Final Score (AUDIT): 0 Substance Abuse History in the last 12 months:  Yes.   Consequences of Substance Abuse: Medical Consequences:  Contributing to hospitalization Family Consequences:  losing contact with family Previous Psychotropic Medications: Yes  Zoloft Psychological Evaluations: No  Past Medical History:  Past Medical History:  Diagnosis Date   History of suicide attempt 08/05/2022   2018   Tobacco use disorder 08/05/2022   History  reviewed. No pertinent surgical history. Family History: History reviewed. No pertinent family history. Family Psychiatric  History:  Brother, Sister- Crack Cocaine Abuse No Known Diagnosis' or Suicides  Tobacco Screening:  Social History   Tobacco Use  Smoking Status Former   Types: Cigarettes  Smokeless Tobacco Never    BH Tobacco Counseling     Are you interested in Tobacco Cessation Medications?  No value filed. Counseled patient on smoking cessation:  No value filed. Reason Tobacco Screening Not Completed: No value filed.       Social History:  Social History   Substance and Sexual Activity  Alcohol Use Yes     Social History   Substance and Sexual Activity  Drug Use Yes   Types: Cocaine   Comment: Heroin/Crack    Additional Social History:                           Allergies:  No Known Allergies Lab Results:  Results for orders placed or performed during the hospital encounter of 11/21/23 (from the  past 48 hours)  POCT Urine Drug Screen - (I-Screen)     Status: Abnormal   Collection Time: 11/21/23 12:53 PM  Result Value Ref Range   POC Amphetamine UR None Detected NONE DETECTED (Cut Off Level 1000 ng/mL)   POC Secobarbital (BAR) None Detected NONE DETECTED (Cut Off Level 300 ng/mL)   POC Buprenorphine (BUP) None Detected NONE DETECTED (Cut Off Level 10 ng/mL)   POC Oxazepam (BZO) None Detected NONE DETECTED (Cut Off Level 300 ng/mL)   POC Cocaine UR Positive (A) NONE DETECTED (Cut Off Level 300 ng/mL)   POC Methamphetamine UR None Detected NONE DETECTED (Cut Off Level 1000 ng/mL)   POC Morphine None Detected NONE DETECTED (Cut Off Level 300 ng/mL)   POC Methadone UR None Detected NONE DETECTED (Cut Off Level 300 ng/mL)   POC Oxycodone UR None Detected NONE DETECTED (Cut Off Level 100 ng/mL)   POC Marijuana UR None Detected NONE DETECTED (Cut Off Level 50 ng/mL)  Urinalysis, Complete w Microscopic -Urine, Clean Catch     Status: Abnormal    Collection Time: 11/21/23 12:54 PM  Result Value Ref Range   Color, Urine AMBER (A) YELLOW    Comment: BIOCHEMICALS MAY BE AFFECTED BY COLOR   APPearance CLOUDY (A) CLEAR   Specific Gravity, Urine 1.021 1.005 - 1.030   pH 6.0 5.0 - 8.0   Glucose, UA NEGATIVE NEGATIVE mg/dL   Hgb urine dipstick NEGATIVE NEGATIVE   Bilirubin Urine NEGATIVE NEGATIVE   Ketones, ur NEGATIVE NEGATIVE mg/dL   Protein, ur 30 (A) NEGATIVE mg/dL   Nitrite NEGATIVE NEGATIVE   Leukocytes,Ua SMALL (A) NEGATIVE   RBC / HPF 0-5 0 - 5 RBC/hpf   WBC, UA 6-10 0 - 5 WBC/hpf   Bacteria, UA RARE (A) NONE SEEN   Squamous Epithelial / HPF 21-50 0 - 5 /HPF   Mucus PRESENT     Comment: Performed at [facility], [address], [place], [place] [address]  CBC with Differential/Platelet     Status: None   Collection Time: 11/21/23  1:11 PM  Result Value Ref Range   WBC 6.5 4.0 - 10.5 K/uL   RBC 4.91 3.87 - 5.11 MIL/uL   Hemoglobin 14.0 12.0 - 15.0 g/dL   HCT 60.4 54.0 - 98.1 %   MCV 89.8 80.0 - 100.0 fL   MCH 28.5 26.0 - 34.0 pg   MCHC 31.7 30.0 - 36.0 g/dL   RDW 19.1 47.8 - 29.5 %   Platelets 259 150 - 400 K/uL   nRBC 0.0 0.0 - 0.2 %   Neutrophils Relative % 61 %   Neutro Abs 4.0 1.7 - 7.7 K/uL   Lymphocytes Relative 31 %   Lymphs Abs 2.0 0.7 - 4.0 K/uL   Monocytes Relative 6 %   Monocytes Absolute 0.4 0.1 - 1.0 K/uL   Eosinophils Relative 1 %   Eosinophils Absolute 0.1 0.0 - 0.5 K/uL   Basophils Relative 1 %   Basophils Absolute 0.0 0.0 - 0.1 K/uL   Immature Granulocytes 0 %   Abs Immature Granulocytes 0.02 0.00 - 0.07 K/uL    Comment: Performed at [facility], [address], [place], [place] [address]  Comprehensive metabolic panel     Status: Abnormal   Collection Time: 11/21/23  1:11 PM  Result Value Ref Range   Sodium 146 (H) 135 - 145 mmol/L   Potassium 3.7 3.5 - 5.1 mmol/L   Chloride 104 98 - 111 mmol/L  CO2 29 22 - 32 mmol/L   Glucose, Bld 96 70 - 99 mg/dL    Comment: Glucose  reference range applies only to samples taken after fasting for at least 8 hours.   BUN 13 6 - 20 mg/dL   Creatinine, Ser 1.61 0.44 - 1.00 mg/dL   Calcium 9.3 8.9 - 09.6 mg/dL   Total Protein 6.6 6.5 - 8.1 g/dL   Albumin 3.9 3.5 - 5.0 g/dL   AST 17 15 - 41 U/L   ALT 19 0 - 44 U/L   Alkaline Phosphatase 80 38 - 126 U/L   Total Bilirubin 0.4 0.0 - 1.2 mg/dL   GFR, Estimated >04 >54 mL/min    Comment: (NOTE) Calculated using the CKD-EPI Creatinine Equation (2021)    Anion gap 13 5 - 15    Comment: Performed at [facility], [address] [place], [place] [address]  Hemoglobin A1c     Status: Abnormal   Collection Time: 11/21/23  1:11 PM  Result Value Ref Range   Hgb A1c MFr Bld 5.8 (H) 4.8 - 5.6 %    Comment: (NOTE)         Prediabetes: 5.7 - 6.4         Diabetes: >6.4         Glycemic control for adults with diabetes: <7.0    Mean Plasma Glucose 120 mg/dL    Comment: (NOTE) Performed At: [facility] [address] [place], [place] [id number] Jolene Schimke MD OZ:[id number]   Magnesium     Status: None   Collection Time: 11/21/23  1:11 PM  Result Value Ref Range   Magnesium 2.2 1.7 - 2.4 mg/dL    Comment: Performed at [facility], [address], [place], [place] [address]  Ethanol     Status: None   Collection Time: 11/21/23  1:11 PM  Result Value Ref Range   Alcohol, Ethyl (B) <10 <10 mg/dL    Comment: (NOTE) Lowest detectable limit for serum alcohol is 10 mg/dL.  For medical purposes only. Performed at [facility], [address], [place], [place] [address]   Lipid panel     Status: Abnormal   Collection Time: 11/21/23  1:11 PM  Result Value Ref Range   Cholesterol 205 (H) 0 - 200 mg/dL   Triglycerides 99 <440 mg/dL   HDL 63 >10 mg/dL   Total CHOL/HDL Ratio 3.3 RATIO   VLDL 20 0 - 40 mg/dL   LDL Cholesterol 272 (H) 0 - 99 mg/dL    Comment:        Total Cholesterol/HDL:CHD Risk Coronary Heart Disease Risk Table                      Men   Women  1/2 Average Risk   3.4   3.3  Average Risk       5.0   4.4  2 X Average Risk   9.6   7.1  3 X Average Risk  23.4   11.0        Use the calculated Patient Ratio above and the CHD Risk Table to determine the patient's CHD Risk.        ATP III CLASSIFICATION (LDL):  <100     mg/dL   Optimal  536-644  mg/dL   Near or Above                    Optimal  130-159  mg/dL   Borderline  161-096  mg/dL   High  >045     mg/dL   Very High Performed at [facility], [address], [place], [place] [address]   TSH     Status: None   Collection Time: 11/21/23  1:11 PM  Result Value Ref Range   TSH 0.723 0.350 - 4.500 uIU/mL    Comment: Performed by a 3rd Generation assay with a functional sensitivity of <=0.01 uIU/mL. Performed at [facility], [address], [place], [place] [address]   Vitamin B12     Status: None   Collection Time: 11/21/23  1:11 PM  Result Value Ref Range   Vitamin B-12 306 180 - 914 pg/mL    Comment: (NOTE) This assay is not validated for testing neonatal or myeloproliferative syndrome specimens for Vitamin B12 levels. Performed at [facility], [address] [place], [place] [address]   T4, free     Status: None   Collection Time: 11/21/23  1:11 PM  Result Value Ref Range   Free T4 0.88 0.61 - 1.12 ng/dL    Comment: (NOTE) Biotin ingestion may interfere with free T4 tests. If the results are inconsistent with the TSH level, previous test results, or the clinical presentation, then consider biotin interference. If needed, order repeat testing after stopping biotin. Performed at [facility], [address], [place], [place] [address]   Pregnancy, urine POC     Status: None   Collection Time: 11/21/23  1:20 PM  Result Value Ref Range   Preg Test, Ur NEGATIVE NEGATIVE    Comment:        THE SENSITIVITY OF THIS METHODOLOGY IS >24 mIU/mL     Blood Alcohol level:  Lab Results  Component Value Date   ETH <10  11/21/2023   ETH <10 07/08/2023    Metabolic Disorder Labs:  Lab Results  Component Value Date   HGBA1C 5.8 (H) 11/21/2023   MPG 120 11/21/2023   MPG 119.76 07/11/2023   Lab Results  Component Value Date   PROLACTIN 7.6 08/04/2022   Lab Results  Component Value Date   CHOL 205 (H) 11/21/2023   TRIG 99 11/21/2023   HDL 63 11/21/2023   CHOLHDL 3.3 11/21/2023   VLDL 20 11/21/2023   LDLCALC 122 (H) 11/21/2023   LDLCALC 110 (H) 05/20/2023    Current Medications: Current Facility-Administered Medications  Medication Dose Route Frequency Provider Last Rate Last Admin   acetaminophen (TYLENOL) tablet 650 mg  650 mg Oral Q6H PRN Ardis Hughs, NP       alum & mag hydroxide-simeth (MAALOX/MYLANTA) 200-200-20 MG/5ML suspension 30 mL  30 mL Oral Q4H PRN Ardis Hughs, NP       amLODipine (NORVASC) tablet 5 mg  5 mg Oral Daily Lauro Franklin, MD   5 mg at 11/22/23 1137   aspirin EC tablet 81 mg  81 mg Oral QHS Lauro Franklin, MD       atorvastatin (LIPITOR) tablet 40 mg  40 mg Oral QHS Lauro Franklin, MD       cloNIDine (CATAPRES) tablet 0.1 mg  0.1 mg Oral TID PRN Lauro Franklin, MD       dicyclomine (BENTYL) tablet 20 mg  20 mg Oral Q6H PRN Lauro Franklin, MD       haloperidol (HALDOL) tablet 5 mg  5 mg Oral TID PRN Lauro Franklin, MD  And   diphenhydrAMINE (BENADRYL) capsule 50 mg  50 mg Oral TID PRN Lauro Franklin, MD       haloperidol lactate (HALDOL) injection 5 mg  5 mg Intramuscular TID PRN Lauro Franklin, MD       And   diphenhydrAMINE (BENADRYL) injection 50 mg  50 mg Intramuscular TID PRN Lauro Franklin, MD       And   LORazepam (ATIVAN) injection 2 mg  2 mg Intramuscular TID PRN Lauro Franklin, MD       hydrOXYzine (ATARAX) tablet 25 mg  25 mg Oral TID PRN Ardis Hughs, NP       loperamide (IMODIUM) capsule 2-4 mg  2-4 mg Oral PRN Lauro Franklin, MD       LORazepam (ATIVAN)  tablet 1 mg  1 mg Oral Q4H PRN Ardis Hughs, NP   1 mg at 11/22/23 1610   magnesium hydroxide (MILK OF MAGNESIA) suspension 30 mL  30 mL Oral Daily PRN Ardis Hughs, NP       methocarbamol (ROBAXIN) tablet 500 mg  500 mg Oral Q8H PRN Lauro Franklin, MD       naproxen (NAPROSYN) tablet 500 mg  500 mg Oral BID PRN Lauro Franklin, MD       ondansetron (ZOFRAN-ODT) disintegrating tablet 4 mg  4 mg Oral Q6H PRN Lauro Franklin, MD       Melene Muller ON 11/23/2023] sertraline (ZOLOFT) tablet 50 mg  50 mg Oral Daily Jamesyn Moorefield, [name], MD       PTA Medications: Medications Prior to Admission  Medication Sig Dispense Refill Last Dose/Taking   amLODipine (NORVASC) 5 MG tablet Take 1 tablet (5 mg total) by mouth daily. (Patient not taking: Reported on 11/22/2023) 30 tablet 1 Not Taking   aspirin EC 81 MG tablet Take 1 tablet (81 mg total) by mouth at bedtime. Swallow whole. (Patient not taking: Reported on 11/22/2023)   Not Taking   atorvastatin (LIPITOR) 40 MG tablet Take 1 tablet (40 mg total) by mouth at bedtime. (Patient not taking: Reported on 11/22/2023) 30 tablet 1 Not Taking   cyclobenzaprine (FLEXERIL) 5 MG tablet Take 5 mg by mouth 3 (three) times daily as needed for muscle spasms. (Patient not taking: Reported on 11/22/2023)   Not Taking   meloxicam (MOBIC) 15 MG tablet Take 1 tablet (15 mg total) by mouth daily. (Patient not taking: Reported on 11/22/2023) 30 tablet 1 Not Taking   traZODone (DESYREL) 100 MG tablet Take 100 mg by mouth at bedtime. (Patient not taking: Reported on 11/22/2023)   Not Taking    Musculoskeletal: Strength & Muscle Tone: within normal limits Gait & Station:  laying in bed Patient leans: N/A            Psychiatric Specialty Exam:  Presentation  General Appearance:  Disheveled  Eye Contact: Poor  Speech: Clear and Coherent; Slow  Speech Volume: Normal  Handedness: Right   Mood and Affect  Mood: Anxious; Depressed;  Hopeless  Affect: Depressed; Constricted; Congruent   Thought Process  Thought Processes: Coherent  Duration of Psychotic Symptoms:N/A Past Diagnosis of Schizophrenia or Psychoactive disorder: No  Descriptions of Associations:Intact  Orientation:Full (Time, Place and Person)  Thought Content:Logical  Hallucinations:Hallucinations: None  Ideas of Reference:None  Suicidal Thoughts:Suicidal Thoughts: Yes, Active SI Active Intent and/or Plan: Without Means to Carry Out  Homicidal Thoughts:Homicidal Thoughts: No   Sensorium  Memory: Immediate Fair; Recent Fair  Judgment: Fair  Insight: Fair   Chartered certified accountant: Poor  Attention Span: Poor  Recall: [employer] of Knowledge: Fair  Language: Fair   Psychomotor Activity  Psychomotor Activity: Psychomotor Activity: Normal   Assets  Assets: Manufacturing systems engineer; Desire for Improvement; Resilience   Sleep  Sleep: Sleep: Fair Number of Hours of Sleep: 2    Physical Exam: Physical Exam Vitals and nursing note reviewed.  Constitutional:      General: She is not in acute distress.    Appearance: Normal appearance. She is not ill-appearing or toxic-appearing.  HENT:     Head: Normocephalic and atraumatic.  Pulmonary:     Effort: Pulmonary effort is normal.    Review of Systems  Psychiatric/Behavioral:  Positive for depression and suicidal ideas (Active without means). Negative for hallucinations. The patient is not nervous/anxious.    Blood pressure (!) 158/58, pulse 67, temperature 98.6 F (37 C), temperature source Oral, resp. rate 20, height 5\' 2"  (1.575 m), weight 67 kg, last menstrual period 11/18/2018, SpO2 98%. Body mass index is 27 kg/m.  Treatment Plan Summary: Daily contact with patient to assess and evaluate symptoms and progress in treatment and Medication management  Gabriela Irigoyen is a 53 yr old female who presented on 2/16 to [facility] with SI worsened by her  substance abuse, she was admitted to [facility] on 2/17.  PPHx is significant for MDD and Cocaine Use Disorder, and 2 Suicide Attempts (last- Ingestion 08/2022) and 2 Prior Psychiatric Hospitalizations, and no history of Self Injurious Behavior.   Trinadee has a long documented history of MDD and is not showing any signs of psychosis or mania.  Since she reports benefit from Zoloft in the past we will restart it.  She is having elevated BP so will restart her Amlodipine and start PRN Clonidine.  She would benefit from further substance abuse treatment as most of her symptoms do stem from this so will recommend residential rehab and appreciate [employer] assistance with this.  We will not make any other changes to her medications.  We will continue to monitor.    MDD, Recurrent, Severe, w/out Psychosis: -Start Zoloft 25 mg daily and increase to 50 mg tomorrow for depression -Start Agitation Protocol: Haldol/Benadryl/Ativan   Withdrawal: -Start COWS -Start  Imodium 2-4 mg PRN diarrhea -Start  Robaxin 500 mg q8 PRN muscle spasms -Start  Naproxen 500 mg BID PRN pain -Start  Zofran-ODT 4 mg q6 PRN nausea -Continue Bentyl 20 mg q6 PRN spasms   HTN: -Restart Amlodipine 5 mg daily -Start Clonidine 0.1 mg TID PRN Administer one dose if SBP is over 180 -or- if DBP is over 100    -Restart Lipitor 40 mg QHS -Restart Aspirin EC 81 mg daily -Continue PRN's: Tylenol, Maalox, Atarax, Milk of Magnesia    Observation Level/Precautions:  15 minute checks  Laboratory:  CMP: WNL except Na: 146,  CBC: WNL,  A1c: 5.8,  Lipid Panel: WNL except  Chol: 205,  LDL: 122,  B12: 306,  EtOH: Neg,  Mag: 2.2, TSH: 0.723,  freeT4:0.88,  Urine Preg: Neg,  UDS: Cocaine pos,  EKG: Prolonged QT with Qtc: 505 Folate, RPR, Vit D, and repeat EKG ordered  Psychotherapy:    Medications:  Zoloft, Amlodipine, Clonidine  Consultations:    Discharge Concerns:    Estimated LOS: 5-7 days  Other:     Physician Treatment Plan for  Primary Diagnosis: MDD (major depressive disorder), recurrent severe, without psychosis (HCC) Long Term Goal(s): Improvement in symptoms so  as ready for discharge  Short Term Goals: Ability to identify changes in lifestyle to reduce recurrence of condition will improve, Ability to verbalize feelings will improve, Ability to disclose and discuss suicidal ideas, Ability to demonstrate self-control will improve, Ability to identify and develop effective coping behaviors will improve, Ability to maintain clinical measurements within normal limits will improve, Compliance with prescribed medications will improve, and Ability to identify triggers associated with substance abuse/mental health issues will improve  Physician Treatment Plan for Secondary Diagnosis: Principal Problem:   MDD (major depressive disorder), recurrent severe, without psychosis (HCC) Active Problems:   Essential hypertension  Long Term Goal(s): Improvement in symptoms so as ready for discharge  Short Term Goals: Ability to identify changes in lifestyle to reduce recurrence of condition will improve, Ability to verbalize feelings will improve, Ability to disclose and discuss suicidal ideas, Ability to demonstrate self-control will improve, Ability to identify and develop effective coping behaviors will improve, Ability to maintain clinical measurements within normal limits will improve, Compliance with prescribed medications will improve, and Ability to identify triggers associated with substance abuse/mental health issues will improve  I certify that inpatient services furnished can reasonably be expected to improve the patient's condition.    Lauro Franklin, MD 2/17/202512:17 PM

## 2023-11-22 NOTE — [facility] Group Notes (Signed)
 [facility] Group Notes:  (Nursing/MHT/Case Management/Adjunct)  Date:  11/22/2023  Time:  9:05 PM  Type of Therapy:  Psychoeducational Skills  Participation Level:  Did Not Attend  Participation Quality:  Resistant  Affect:  Resistant  Cognitive:  Lacking  Insight:  None  Engagement in Group:  None  Modes of Intervention:  Education  Summary of Progress/Problems: The patient did not attend group this evening.   Hazle Coca S 11/22/2023, 9:05 PM

## 2023-11-22 NOTE — Progress Notes (Signed)
   11/21/23 2030  Psych Admission Type (Psych Patients Only)  Admission Status Voluntary  Psychosocial Assessment  Patient Complaints None  Eye Contact None  Facial Expression Other (Comment) (sleepy)  Affect Blunted (tired)  Speech Soft;Incoherent  Interaction Minimal  Motor Activity Slow;Other (Comment)  Appearance/Hygiene In scrubs  Behavior Characteristics Other (Comment);Calm (Pt too sleepy to have discussion)  Mood Other (Comment) (Sleeping, couldn't keep eyes open)  Thought Process  Coherency WDL  Content WDL  Delusions None reported or observed  Perception WDL  Hallucination None reported or observed  Judgment Impaired  Confusion None  Danger to Self  Current suicidal ideation?  (Unable to assess; pt lethargic)  Self-Injurious Behavior No self-injurious ideation or behavior indicators observed or expressed   Agreement Not to Harm Self No  Description of Agreement Unable to assess  Danger to Others  Danger to Others None reported or observed

## 2023-11-22 NOTE — Group Note (Signed)
 Date:  11/22/2023 Time:  6:17 PM  Group Topic/Focus:  Goals Group:   The focus of this group is to help patients establish daily goals to achieve during treatment and discuss how the patient can incorporate goal setting into their daily lives to aide in recovery. Orientation:   The focus of this group is to educate the patient on the purpose and policies of crisis stabilization and provide a format to answer questions about their admission.  The group details unit policies and expectations of patients while admitted.    Participation Level:  Did Not Attend  Participation Quality:   n/a  Affect:   n/a  Cognitive:   n/a  Insight: None  Engagement in Group:   n/a  Modes of Intervention:   n/a  Additional Comments:   Pt did not attend.  Alexandra Buckley 11/22/2023, 6:17 PM

## 2023-11-23 ENCOUNTER — Encounter (HOSPITAL_COMMUNITY): Payer: Self-pay | Admitting: Psychiatry

## 2023-11-23 LAB — T3, FREE: T3, Free: 3.4 pg/mL (ref 2.0–4.4)

## 2023-11-23 MED ORDER — AMLODIPINE BESYLATE 10 MG PO TABS
10.0000 mg | ORAL_TABLET | Freq: Every day | ORAL | Status: DC
  Administered 2023-11-24 – 2023-11-29 (×6): 10 mg via ORAL
  Filled 2023-11-23: qty 14
  Filled 2023-11-23 (×3): qty 1
  Filled 2023-11-23: qty 14
  Filled 2023-11-23: qty 1
  Filled 2023-11-23: qty 14
  Filled 2023-11-23: qty 1
  Filled 2023-11-23: qty 14

## 2023-11-23 MED ORDER — NAPROXEN 500 MG PO TABS
500.0000 mg | ORAL_TABLET | Freq: Two times a day (BID) | ORAL | Status: DC
  Administered 2023-11-23 – 2023-11-29 (×12): 500 mg via ORAL
  Filled 2023-11-23 (×8): qty 1
  Filled 2023-11-23 (×2): qty 14
  Filled 2023-11-23 (×6): qty 1

## 2023-11-23 MED ORDER — AMLODIPINE BESYLATE 5 MG PO TABS
5.0000 mg | ORAL_TABLET | Freq: Once | ORAL | Status: AC
  Administered 2023-11-23: 5 mg via ORAL
  Filled 2023-11-23: qty 1

## 2023-11-23 NOTE — [facility]HH Group Notes (Signed)

## 2023-11-23 NOTE — Plan of Care (Signed)
 Pt observed in bed majority of this shift. Presents guarded, cautious, isolative to room. Noted with flat affect, logical soft speech, slow, steady gait, depressed mood and restless /fidgety on with frequent shifting in bed on interactions. Denies SI, HI and AVH when assessed. Received PRN Tylenol 650 mg PO for at 0838 for lower back pain 10/10. Reports fair appetite, stated she slept fair last night due to headaches, discomfort. Rated her depression 10/10, anxiety 8/10 with stressor being "My life, roads that I took, decisions I made that led me here" in reference drug use. Vitals elevated earlier this shift but has improved this evening. Observed with clammy skin when assessed this afternoon. Pt unable to attend scheduled groups due to generalized discomfort, fatigue and anxiety as related to withdrawals symptoms. COWS completed, documented as ordered (flowsheet). Encouraged to attend to her ADLs but is currently unable to "I will try later, I'm just tired right now". Pt is medication compliant without adverse drug reactions. Safety checks maintained without self harm gestures or outburst. Pt's concerns validated, emotional support, encouragement and reassurance offered to pt.    Problem: Activity: Goal: Sleeping patterns will improve Outcome: Progressing   Problem: Coping: Goal: Ability to verbalize frustrations and anger appropriately will improve Outcome: Progressing   Problem: Safety: Goal: Periods of time without injury will increase 11/23/2023 1658 by Sherryl Manges, RN Outcome: Progressing 11/23/2023 1656 by Sherryl Manges, RN Outcome: Progressing

## 2023-11-23 NOTE — Progress Notes (Signed)
   11/22/23 2058  Psych Admission Type (Psych Patients Only)  Admission Status Voluntary  Psychosocial Assessment  Patient Complaints Anxiety;Depression  Eye Contact Brief  Facial Expression Flat;Sullen  Affect Depressed  Speech Slow  Interaction Isolative  Motor Activity Slow  Appearance/Hygiene Disheveled  Behavior Characteristics Appropriate to situation  Mood Depressed  Thought Process  Coherency WDL  Content WDL  Delusions None reported or observed  Perception WDL  Hallucination None reported or observed  Judgment Poor  Confusion Mild  Danger to Self  Current suicidal ideation? Denies  Self-Injurious Behavior No self-injurious ideation or behavior indicators observed or expressed   Agreement Not to Harm Self Yes  Description of Agreement verbal  Danger to Others  Danger to Others None reported or observed

## 2023-11-23 NOTE — Group Note (Signed)
 LCSW Group Therapy Note   Group Date: 11/23/2023 Start Time: 1100 End Time: 1200  Participation: did not attend  Type of Therapy:  Group Therapy  Title: "Healing Flames: Navigating Anger with Compassion"  Objective:  Foster self-awareness and promote compassion toward oneself and others when dealing with anger.  Goals:  Help participants understand the underlying emotions and needs fueling anger. Provide coping strategies for healthier emotional expression and anger management.  Summary: This session explored anger as a volcano--an explosion driven by deeper feelings and unmet needs. Participants learned to identify anger triggers and underlying emotions, then practiced coping strategies like deep breathing, physical activity, and journaling. The group discussed healthy ways to manage anger before it escalates, using both personal reflection and shared experiences.  Therapeutic Modalities: Cognitive Behavioral Therapy (CBT): Challenging thoughts that fuel anger. Mindfulness: Increasing awareness of emotions and sensations.   Alla Feeling, LCSWA 11/23/2023  7:47 PM
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
## 2023-11-23 NOTE — Progress Notes (Signed)
     11/23/2023       1:44 PM   Alexandra Buckley  Spoke with patient at bedside regarding discharge planning. Pt reports being homeless in [place]. Pt was given shelter list/resources and was encouraged to begin calling in the next couple of days. Pt was agreeable.   Pt reports not being interested in substance use treatment at this time. Pt agreeable to go to a shelter at discharge. Will assist as needed.  Signed:  Dejha King, LCSW-A 11/23/2023  1:44 PM

## 2023-11-23 NOTE — Progress Notes (Addendum)
 [facility] MD Progress Note  11/23/2023 10:46 AM Alexandra Buckley  MRN:  [id number] Subjective:   Alexandra Buckley is a 53 yr old female who presented on 2/16 to [facility] with SI worsened by her substance abuse, she was admitted to [facility] on 2/17. PPHx is significant for MDD and Cocaine Use Disorder, and 2 Suicide Attempts (last- Ingestion 08/2022) and 2 Prior Psychiatric Hospitalizations, and no history of Self Injurious Behavior.    Case was discussed in the multidisciplinary team. MAR was reviewed and patient was compliant with medications.  She received PRN Clonidine for BP this morning, and PRN Hydroxyzine, Naproxen, and Ativan.   Psychiatric Team made the following recommendations yesterday: -Start Zoloft 25 mg daily  -Start Agitation Protocol  -Start COWS  -Restart Amlodipine 5 mg daily -Start Clonidine 0.1 mg TID PRN -Restart Lipitor 40 mg QHS -Restart Aspirin EC 81 mg daily   On interview today patient reports she slept poor last night due to withdrawal symptoms.  She reports her appetite is doing good.  She reports no SI, HI, or AVH.  She reports no Paranoia or Ideas of Reference.  She reports no issues with her medications.  She reports continuing to have significant depression (10 out of 10) and anxiety (8 out of 10).  She reports having significant sweating and chills throughout the night.  She also reports some mild dizziness.  Discussed with her that her blood pressure continues to be elevated and so we would further increase her Amlodipine but that some of this was due to withdrawal.  Discussed if she would be interested in Residential Treatment but she reports she is not interested in this at this time.  She reports no other concerns at present.   Principal Problem: MDD (major depressive disorder), recurrent severe, without psychosis (HCC) Diagnosis: Principal Problem:   MDD (major depressive disorder), recurrent severe, without psychosis (HCC) Active Problems:   Essential  hypertension  Total Time spent with patient:  I personally spent 35 minutes on the unit in direct patient care. The direct patient care time included face-to-face time with the patient, reviewing the patient's chart, communicating with other professionals, and coordinating care. Greater than 50% of this time was spent in counseling or coordinating care with the patient regarding goals of hospitalization, psycho-education, and discharge planning needs.   Past Psychiatric History: MDD and Cocaine Use Disorder, and 2 Suicide Attempts (last- Ingestion 08/2022) and 2 Prior Psychiatric Hospitalizations, and no history of Self Injurious Behavior.   Past Medical History:  Past Medical History:  Diagnosis Date   History of suicide attempt 08/05/2022   2018   Tobacco use disorder 08/05/2022   History reviewed. No pertinent surgical history. Family History: History reviewed. No pertinent family history. Family Psychiatric  History:  Brother, Sister- Crack Cocaine Abuse No Known Diagnosis' or Suicides  Social History:  Social History   Substance and Sexual Activity  Alcohol Use Yes     Social History   Substance and Sexual Activity  Drug Use Yes   Types: Cocaine   Comment: Heroin/Crack    Social History   Socioeconomic History   Marital status: Legally Separated    Spouse name: Not on file   Number of children: Not on file   Years of education: Not on file   Highest education level: Not on file  Occupational History   Not on file  Tobacco Use   Smoking status: Former    Types: Cigarettes   Smokeless tobacco: Never  Vaping Use  Vaping status: Never Used  Substance and Sexual Activity   Alcohol use: Yes   Drug use: Yes    Types: Cocaine    Comment: Heroin/Crack   Sexual activity: Not on file  Other Topics Concern   Not on file  Social History Narrative   Not on file   Social Drivers of Health   Financial Resource Strain: Not on file  Food Insecurity: Food Insecurity  Present (11/21/2023)   Hunger Vital Sign    Worried About Running Out of Food in the Last Year: Often true    Ran Out of Food in the Last Year: Often true  Transportation Needs: Unmet Transportation Needs (11/21/2023)   PRAPARE - Administrator, Civil Service (Medical): Yes    Lack of Transportation (Non-Medical): Yes  Physical Activity: Not on file  Stress: Not on file  Social Connections: Unknown (02/17/2022)   Received from [facility], [facility]   Social Network    Social Network: Not on file   Additional Social History:                         Sleep: Poor due to withdrawal  Appetite:  Good  Current Medications: Current Facility-Administered Medications  Medication Dose Route Frequency Provider Last Rate Last Admin   acetaminophen (TYLENOL) tablet 650 mg  650 mg Oral Q6H PRN Ardis Hughs, NP   650 mg at 11/23/23 0839   alum & mag hydroxide-simeth (MAALOX/MYLANTA) 200-200-20 MG/5ML suspension 30 mL  30 mL Oral Q4H PRN Ardis Hughs, NP       [START ON 11/24/2023] amLODipine (NORVASC) tablet 10 mg  10 mg Oral Daily Earsel Shouse, [name], MD       amLODipine (NORVASC) tablet 5 mg  5 mg Oral Once Lauro Franklin, MD       aspirin EC tablet 81 mg  81 mg Oral QHS Lauro Franklin, MD   81 mg at 11/22/23 2127   atorvastatin (LIPITOR) tablet 40 mg  40 mg Oral QHS Lauro Franklin, MD   40 mg at 11/22/23 2127   cloNIDine (CATAPRES) tablet 0.1 mg  0.1 mg Oral TID PRN Lauro Franklin, MD   0.1 mg at 11/23/23 1610   dicyclomine (BENTYL) tablet 20 mg  20 mg Oral Q6H PRN Lauro Franklin, MD       haloperidol (HALDOL) tablet 5 mg  5 mg Oral TID PRN Lauro Franklin, MD       And   diphenhydrAMINE (BENADRYL) capsule 50 mg  50 mg Oral TID PRN Lauro Franklin, MD       haloperidol lactate (HALDOL) injection 5 mg  5 mg Intramuscular TID PRN Lauro Franklin, MD       And   diphenhydrAMINE (BENADRYL) injection 50 mg   50 mg Intramuscular TID PRN Lauro Franklin, MD       And   LORazepam (ATIVAN) injection 2 mg  2 mg Intramuscular TID PRN Lauro Franklin, MD       hydrOXYzine (ATARAX) tablet 25 mg  25 mg Oral TID PRN Ardis Hughs, NP   25 mg at 11/23/23 0839   loperamide (IMODIUM) capsule 2-4 mg  2-4 mg Oral PRN Lauro Franklin, MD       LORazepam (ATIVAN) tablet 1 mg  1 mg Oral Q4H PRN Ardis Hughs, NP   1 mg at 11/22/23 0647   magnesium hydroxide (MILK  OF MAGNESIA) suspension 30 mL  30 mL Oral Daily PRN Ardis Hughs, NP       methocarbamol (ROBAXIN) tablet 500 mg  500 mg Oral Q8H PRN Lauro Franklin, MD   500 mg at 11/23/23 2130   naproxen (NAPROSYN) tablet 500 mg  500 mg Oral BID PRN Lauro Franklin, MD   500 mg at 11/22/23 2127   ondansetron (ZOFRAN-ODT) disintegrating tablet 4 mg  4 mg Oral Q6H PRN Lauro Franklin, MD       sertraline (ZOLOFT) tablet 50 mg  50 mg Oral Daily Lauro Franklin, MD   50 mg at 11/23/23 8657    Lab Results:  Results for orders placed or performed during the hospital encounter of 11/21/23 (from the past 48 hours)  POCT Urine Drug Screen - (I-Screen)     Status: Abnormal   Collection Time: 11/21/23 12:53 PM  Result Value Ref Range   POC Amphetamine UR None Detected NONE DETECTED (Cut Off Level 1000 ng/mL)   POC Secobarbital (BAR) None Detected NONE DETECTED (Cut Off Level 300 ng/mL)   POC Buprenorphine (BUP) None Detected NONE DETECTED (Cut Off Level 10 ng/mL)   POC Oxazepam (BZO) None Detected NONE DETECTED (Cut Off Level 300 ng/mL)   POC Cocaine UR Positive (A) NONE DETECTED (Cut Off Level 300 ng/mL)   POC Methamphetamine UR None Detected NONE DETECTED (Cut Off Level 1000 ng/mL)   POC Morphine None Detected NONE DETECTED (Cut Off Level 300 ng/mL)   POC Methadone UR None Detected NONE DETECTED (Cut Off Level 300 ng/mL)   POC Oxycodone UR None Detected NONE DETECTED (Cut Off Level 100 ng/mL)   POC Marijuana UR None  Detected NONE DETECTED (Cut Off Level 50 ng/mL)  Urinalysis, Complete w Microscopic -Urine, Clean Catch     Status: Abnormal   Collection Time: 11/21/23 12:54 PM  Result Value Ref Range   Color, Urine AMBER (A) YELLOW    Comment: BIOCHEMICALS MAY BE AFFECTED BY COLOR   APPearance CLOUDY (A) CLEAR   Specific Gravity, Urine 1.021 1.005 - 1.030   pH 6.0 5.0 - 8.0   Glucose, UA NEGATIVE NEGATIVE mg/dL   Hgb urine dipstick NEGATIVE NEGATIVE   Bilirubin Urine NEGATIVE NEGATIVE   Ketones, ur NEGATIVE NEGATIVE mg/dL   Protein, ur 30 (A) NEGATIVE mg/dL   Nitrite NEGATIVE NEGATIVE   Leukocytes,Ua SMALL (A) NEGATIVE   RBC / HPF 0-5 0 - 5 RBC/hpf   WBC, UA 6-10 0 - 5 WBC/hpf   Bacteria, UA RARE (A) NONE SEEN   Squamous Epithelial / HPF 21-50 0 - 5 /HPF   Mucus PRESENT     Comment: Performed at [facility], [address], [place], [place] [address]  CBC with Differential/Platelet     Status: None   Collection Time: 11/21/23  1:11 PM  Result Value Ref Range   WBC 6.5 4.0 - 10.5 K/uL   RBC 4.91 3.87 - 5.11 MIL/uL   Hemoglobin 14.0 12.0 - 15.0 g/dL   HCT 29.5 28.4 - 13.2 %   MCV 89.8 80.0 - 100.0 fL   MCH 28.5 26.0 - 34.0 pg   MCHC 31.7 30.0 - 36.0 g/dL   RDW 44.0 10.2 - 72.5 %   Platelets 259 150 - 400 K/uL   nRBC 0.0 0.0 - 0.2 %   Neutrophils Relative % 61 %   Neutro Abs 4.0 1.7 - 7.7 K/uL   Lymphocytes Relative 31 %   Lymphs Abs  2.0 0.7 - 4.0 K/uL   Monocytes Relative 6 %   Monocytes Absolute 0.4 0.1 - 1.0 K/uL   Eosinophils Relative 1 %   Eosinophils Absolute 0.1 0.0 - 0.5 K/uL   Basophils Relative 1 %   Basophils Absolute 0.0 0.0 - 0.1 K/uL   Immature Granulocytes 0 %   Abs Immature Granulocytes 0.02 0.00 - 0.07 K/uL    Comment: Performed at [facility], [address] [place], [place] [address]  Comprehensive metabolic panel     Status: Abnormal   Collection Time: 11/21/23  1:11 PM  Result Value Ref Range   Sodium 146 (H) 135 - 145 mmol/L   Potassium 3.7 3.5  - 5.1 mmol/L   Chloride 104 98 - 111 mmol/L   CO2 29 22 - 32 mmol/L   Glucose, Bld 96 70 - 99 mg/dL    Comment: Glucose reference range applies only to samples taken after fasting for at least 8 hours.   BUN 13 6 - 20 mg/dL   Creatinine, Ser 1.91 0.44 - 1.00 mg/dL   Calcium 9.3 8.9 - 47.8 mg/dL   Total Protein 6.6 6.5 - 8.1 g/dL   Albumin 3.9 3.5 - 5.0 g/dL   AST 17 15 - 41 U/L   ALT 19 0 - 44 U/L   Alkaline Phosphatase 80 38 - 126 U/L   Total Bilirubin 0.4 0.0 - 1.2 mg/dL   GFR, Estimated >29 >56 mL/min    Comment: (NOTE) Calculated using the CKD-EPI Creatinine Equation (2021)    Anion gap 13 5 - 15    Comment: Performed at [facility], [address], [place], [place] [address]  Hemoglobin A1c     Status: Abnormal   Collection Time: 11/21/23  1:11 PM  Result Value Ref Range   Hgb A1c MFr Bld 5.8 (H) 4.8 - 5.6 %    Comment: (NOTE)         Prediabetes: 5.7 - 6.4         Diabetes: >6.4         Glycemic control for adults with diabetes: <7.0    Mean Plasma Glucose 120 mg/dL    Comment: (NOTE) Performed At: [facility] [address] [place], [place] [id number] Jolene Schimke MD [id number]   Magnesium     Status: None   Collection Time: 11/21/23  1:11 PM  Result Value Ref Range   Magnesium 2.2 1.7 - 2.4 mg/dL    Comment: Performed at [facility], [address] [place], [place] [address]  Ethanol     Status: None   Collection Time: 11/21/23  1:11 PM  Result Value Ref Range   Alcohol, Ethyl (B) <10 <10 mg/dL    Comment: (NOTE) Lowest detectable limit for serum alcohol is 10 mg/dL.  For medical purposes only. Performed at [facility], [address], [place], [place] [address]   Lipid panel     Status: Abnormal   Collection Time: 11/21/23  1:11 PM  Result Value Ref Range   Cholesterol 205 (H) 0 - 200 mg/dL   Triglycerides 99 <034 mg/dL   HDL 63 >74 mg/dL   Total CHOL/HDL Ratio 3.3 RATIO   VLDL 20 0 - 40 mg/dL   LDL Cholesterol  259 (H) 0 - 99 mg/dL    Comment:        Total Cholesterol/HDL:CHD Risk Coronary Heart Disease Risk Table  Men   Women  1/2 Average Risk   3.4   3.3  Average Risk       5.0   4.4  2 X Average Risk   9.6   7.1  3 X Average Risk  23.4   11.0        Use the calculated Patient Ratio above and the CHD Risk Table to determine the patient's CHD Risk.        ATP III CLASSIFICATION (LDL):  <100     mg/dL   Optimal  161-096  mg/dL   Near or Above                    Optimal  130-159  mg/dL   Borderline  045-409  mg/dL   High  >811     mg/dL   Very High Performed at [facility], [address] [place], [place] [address]   TSH     Status: None   Collection Time: 11/21/23  1:11 PM  Result Value Ref Range   TSH 0.723 0.350 - 4.500 uIU/mL    Comment: Performed by a 3rd Generation assay with a functional sensitivity of <=0.01 uIU/mL. Performed at [facility], [address] [place], [place] [address]   Vitamin B12     Status: None   Collection Time: 11/21/23  1:11 PM  Result Value Ref Range   Vitamin B-12 306 180 - 914 pg/mL    Comment: (NOTE) This assay is not validated for testing neonatal or myeloproliferative syndrome specimens for Vitamin B12 levels. Performed at [facility], [address] [place], [place] [address]   T3, free     Status: None   Collection Time: 11/21/23  1:11 PM  Result Value Ref Range   T3, Free 3.4 2.0 - 4.4 pg/mL    Comment: (NOTE) Performed At: [facility] [address] [place], [place] [id number] Jolene Schimke MD [id number]   T4, free     Status: None   Collection Time: 11/21/23  1:11 PM  Result Value Ref Range   Free T4 0.88 0.61 - 1.12 ng/dL    Comment: (NOTE) Biotin ingestion may interfere with free T4 tests. If the results are inconsistent with the TSH level, previous test results, or the clinical presentation, then consider biotin interference. If needed, order repeat testing after  stopping biotin. Performed at [facility], [address] [place], [place] [address]   Pregnancy, urine POC     Status: None   Collection Time: 11/21/23  1:20 PM  Result Value Ref Range   Preg Test, Ur NEGATIVE NEGATIVE    Comment:        THE SENSITIVITY OF THIS METHODOLOGY IS >24 mIU/mL     Blood Alcohol level:  Lab Results  Component Value Date   ETH <10 11/21/2023   ETH <10 07/08/2023    Metabolic Disorder Labs: Lab Results  Component Value Date   HGBA1C 5.8 (H) 11/21/2023   MPG 120 11/21/2023   MPG 119.76 07/11/2023   Lab Results  Component Value Date   PROLACTIN 7.6 08/04/2022   Lab Results  Component Value Date   CHOL 205 (H) 11/21/2023   TRIG 99 11/21/2023   HDL 63 11/21/2023   CHOLHDL 3.3 11/21/2023   VLDL 20 11/21/2023   LDLCALC 122 (H) 11/21/2023   LDLCALC 110 (H) 05/20/2023    Physical Findings: AIMS:  , ,  ,  ,  CIWA:    COWS:  COWS Total Score: 0  Musculoskeletal: Strength & Muscle Tone: within normal limits Gait & Station:  laying in bed Patient leans: N/A  Psychiatric Specialty Exam:  Presentation  General Appearance:  Disheveled  Eye Contact: Fair  Speech: Clear and Coherent; Slow  Speech Volume: Normal  Handedness: Right   Mood and Affect  Mood: Anxious; Dysphoric  Affect: Depressed; Congruent   Thought Process  Thought Processes: Coherent; Goal Directed  Descriptions of Associations:Intact  Orientation:Full (Time, Place and Person)  Thought Content:Logical; WDL  History of Schizophrenia/Schizoaffective disorder:No  Duration of Psychotic Symptoms:No data recorded Hallucinations:Hallucinations: None  Ideas of Reference:None  Suicidal Thoughts:Suicidal Thoughts: No SI Active Intent and/or Plan: Without Means to Carry Out  Homicidal Thoughts:Homicidal Thoughts: No   Sensorium  Memory: Immediate Fair; Recent Fair  Judgment: Fair  Insight: Fair   Producer, television/film/video: Fair  Attention Span: Fair  Recall: [employer] of Knowledge: Fair  Language: Fair   Psychomotor Activity  Psychomotor Activity: Psychomotor Activity: Normal   Assets  Assets: Communication Skills; Desire for Improvement   Sleep  Sleep: Sleep: Poor (due to withdrawal symptoms) Number of Hours of Sleep: 7.75    Physical Exam: Physical Exam Vitals and nursing note reviewed.  Constitutional:      General: She is not in acute distress.    Appearance: Normal appearance. She is normal weight. She is not ill-appearing or toxic-appearing.  HENT:     Head: Normocephalic and atraumatic.  Pulmonary:     Effort: Pulmonary effort is normal.  Neurological:     Mental Status: She is alert.    Review of Systems  Constitutional:  Positive for chills, diaphoresis, fever and malaise/fatigue.  Respiratory:  Negative for cough and shortness of breath.   Cardiovascular:  Negative for chest pain.  Gastrointestinal:  Negative for abdominal pain, constipation, diarrhea, nausea and vomiting.  Neurological:  Positive for dizziness. Negative for weakness and headaches.  Psychiatric/Behavioral:  Positive for depression. Negative for hallucinations and suicidal ideas. The patient is nervous/anxious.    Blood pressure (!) 163/60, pulse 79, temperature 98.4 F (36.9 C), temperature source Oral, resp. rate 12, height 5\' 2"  (1.575 m), weight 67 kg, last menstrual period 11/18/2018, SpO2 98%. Body mass index is 27 kg/m.   Treatment Plan Summary: Daily contact with patient to assess and evaluate symptoms and progress in treatment and Medication management    Alexandra Buckley is a 53 yr old female who presented on 2/16 to [facility] with SI worsened by her substance abuse, she was [facility] to [facility]/17.  PPHx is significant for MDD and Cocaine Use Disorder, and 2 Suicide Attempts (last- Ingestion 08/2022) and 2 Prior Psychiatric Hospitalizations, and no history of Self Injurious  Behavior.     Alexandra Buckley is having withdrawal symptoms.  She has tolerated restarting her Zoloft and so we will increase to 50 mg today.  She continues to have elevated BP most likely a combination of untreated HTN and withdrawal.  We will increase her Amlodipine to address this and will continue with the as needed Clonidine.  Discussed Residential Rehab and she reports not being interested at this time.  We will no make any other changes to her medications at this time.  We will continue to monitor.      MDD, Recurrent, Severe, w/out Psychosis: -Increase Zoloft to 50 mg daily for depression -Continue Agitation Protocol: Haldol/Benadryl/Ativan     Withdrawal: -Continue COWS, last score= 0 @  0000 2/18 -Continue Imodium 2-4 mg PRN diarrhea -Continue Robaxin 500 mg q8 PRN muscle spasms -Continue Naproxen 500 mg BID PRN pain -Continue Zofran-ODT 4 mg q6 PRN nausea -Continue Bentyl 20 mg q6 PRN spasms   HTN: -Continue Amlodipine 5 mg daily -Continue Clonidine 0.1 mg TID PRN Administer one dose if SBP is over 180 -or- if DBP is over 100      -Continue Lipitor 40 mg QHS -Continue Aspirin EC 81 mg daily -Continue PRN's: Tylenol, Maalox, Atarax, Milk of Magnesia   Lauro Franklin, MD 11/23/2023, 10:46 AM

## 2023-11-23 NOTE — [facility] Group Notes (Signed)
 [facility] Group Notes:  (Nursing/MHT/Case Management/Adjunct)  Date:  11/23/2023  Time:  9:02 PM  Type of Therapy:  Psychoeducational Skills  Participation Level:  Did Not Attend  Participation Quality:  Resistant  Affect:  Resistant  Cognitive:  Lacking  Insight:  None  Engagement in Group:  None  Modes of Intervention:  Education  Summary of Progress/Problems: Patient did not attend group this evening.   Hazle Coca S 11/23/2023, 9:02 PM

## 2023-11-23 NOTE — [facility] Group Notes (Signed)
 [facility] Group Notes:  (Nursing/MHT/Case Management/Adjunct) Adult Psychoeducational Group Note  Date:  11/23/2023 Time:  12:12 PM  Group Topic/Focus:  Goals Group:   The focus of this group is to help patients establish daily goals to achieve during treatment and discuss how the patient can incorporate goal setting into their daily lives to aide in recovery. Orientation:   The focus of this group is to educate the patient on the purpose and policies of crisis stabilization and provide a format to answer questions about their admission.  The group details unit policies and expectations of patients while admitted.  Participation Level:  Did Not Attend    Additional Comments:  Did not attend  Alexandra Buckley 11/23/2023, 12:12 PM

## 2023-11-24 LAB — BASIC METABOLIC PANEL
Anion gap: 6 (ref 5–15)
BUN: 14 mg/dL (ref 6–20)
CO2: 26 mmol/L (ref 22–32)
Calcium: 9 mg/dL (ref 8.9–10.3)
Chloride: 110 mmol/L (ref 98–111)
Creatinine, Ser: 0.62 mg/dL (ref 0.44–1.00)
GFR, Estimated: >60 mL/min (ref >60)
Glucose, Bld: 81 mg/dL (ref 70–99)
Potassium: 3.9 mmol/L (ref 3.5–5.1)
Sodium: 142 mmol/L (ref 135–145)

## 2023-11-24 LAB — FOLATE: Folate: 7.2 ng/mL (ref >5.9)

## 2023-11-24 LAB — VITAMIN D 25 HYDROXY (VIT D DEFICIENCY, FRACTURES): Vit D, 25-Hydroxy: 10.71 ng/mL — ABNORMAL LOW (ref 30–100)

## 2023-11-24 LAB — SEDIMENTATION RATE: Sed Rate: 11 mm/h (ref 0–22)

## 2023-11-24 LAB — RPR: RPR Ser Ql: NONREACTIVE

## 2023-11-24 LAB — C-REACTIVE PROTEIN: CRP: 0.8 mg/dL (ref <1.0)

## 2023-11-24 MED ORDER — VITAMIN D (ERGOCALCIFEROL) 1.25 MG (50000 UNIT) PO CAPS
50000.0000 [IU] | ORAL_CAPSULE | ORAL | Status: DC
  Administered 2023-11-24: 50000 [IU] via ORAL
  Filled 2023-11-24: qty 1

## 2023-11-24 MED ORDER — DULOXETINE HCL 30 MG PO CPEP
30.0000 mg | ORAL_CAPSULE | Freq: Every day | ORAL | Status: DC
  Administered 2023-11-24 – 2023-11-27 (×4): 30 mg via ORAL
  Filled 2023-11-24: qty 1
  Filled 2023-11-24 (×2): qty 14
  Filled 2023-11-24 (×3): qty 1

## 2023-11-24 MED ORDER — RISPERIDONE 0.5 MG PO TABS
0.5000 mg | ORAL_TABLET | Freq: Two times a day (BID) | ORAL | Status: DC
  Administered 2023-11-24 – 2023-11-25 (×3): 0.5 mg via ORAL
  Filled 2023-11-24 (×5): qty 1

## 2023-11-24 MED ORDER — VITAMIN D3 25 MCG PO TABS
2000.0000 [IU] | ORAL_TABLET | Freq: Two times a day (BID) | ORAL | Status: DC
  Administered 2023-11-25 – 2023-11-29 (×9): 2000 [IU] via ORAL
  Filled 2023-11-24 (×12): qty 2

## 2023-11-24 MED ORDER — GABAPENTIN 100 MG PO CAPS
100.0000 mg | ORAL_CAPSULE | Freq: Once | ORAL | Status: AC
  Administered 2023-11-24: 100 mg via ORAL
  Filled 2023-11-24 (×2): qty 1

## 2023-11-24 MED ORDER — GABAPENTIN 100 MG PO CAPS
100.0000 mg | ORAL_CAPSULE | Freq: Three times a day (TID) | ORAL | Status: DC
  Administered 2023-11-24 – 2023-11-27 (×9): 100 mg via ORAL
  Filled 2023-11-24: qty 1
  Filled 2023-11-24 (×4): qty 42
  Filled 2023-11-24: qty 1
  Filled 2023-11-24: qty 42
  Filled 2023-11-24 (×3): qty 1
  Filled 2023-11-24: qty 42
  Filled 2023-11-24 (×6): qty 1

## 2023-11-24 MED ORDER — TAB-A-VITE/IRON PO TABS
1.0000 | ORAL_TABLET | Freq: Every day | ORAL | Status: DC
  Administered 2023-11-24 – 2023-11-29 (×6): 1 via ORAL
  Filled 2023-11-24 (×7): qty 1

## 2023-11-24 NOTE — Plan of Care (Signed)
   Problem: Education: Goal: Emotional status will improve Outcome: Progressing Goal: Mental status will improve Outcome: Progressing   Problem: Activity: Goal: Interest or engagement in activities will improve Outcome: Progressing Goal: Sleeping patterns will improve Outcome: Progressing

## 2023-11-24 NOTE — [facility]HH Group Notes (Signed)

## 2023-11-24 NOTE — Progress Notes (Signed)
 [facility] MD Progress Note  11/24/2023 8:46 AM Alexandra Buckley  MRN:  [id number] Subjective:   Alexandra Buckley is a 53 yr old female who presented on 2/16 to [facility] with SI worsened by her substance abuse, she was admitted to [facility] on 2/17. PPHx is significant for MDD and Cocaine Use Disorder, and 2 Suicide Attempts (last- Ingestion 08/2022) and 2 Prior Psychiatric Hospitalizations, and no history of Self Injurious Behavior.    Case was discussed in the multidisciplinary team. MAR was reviewed and patient was compliant with medications.  She received PRN Clonidine for BP this morning, and PRN Hydroxyzine, Naproxen, and Ativan.   Psychiatric Team made the following recommendations yesterday: -Increase Zoloft to 50 mg daily for depression -Increase Amlodipine to 10 mg daily   On interview today patient reports she slept poor last night due to withdrawal and back pain.  She reports her appetite is doing good.  She reports occasional SI but it is faint and quickly leaves.  She reports no HI or AVH.  She reports no Paranoia or Ideas of Reference.  She reports no issues with her medications.  She reports having withdrawal symptoms of sweats/chills.  She reports continuing to have significant anxiety.  Discussed with her that given her anxiety and pain we could trial gabapentin and she was agreeable with this.  Discussed with her that her blood pressure was was better today but still slightly elevated.  Discussed with her that this would most likely continue to improve as amlodipine can take 1 to 2 weeks for full effect.  She reports no other concerns at present.    Principal Problem: MDD (major depressive disorder), recurrent severe, without psychosis (HCC) Diagnosis: Principal Problem:   MDD (major depressive disorder), recurrent severe, without psychosis (HCC) Active Problems:   Essential hypertension  Total Time spent with patient:  I personally spent 35 minutes on the unit in direct patient care. The  direct patient care time included face-to-face time with the patient, reviewing the patient's chart, communicating with other professionals, and coordinating care. Greater than 50% of this time was spent in counseling or coordinating care with the patient regarding goals of hospitalization, psycho-education, and discharge planning needs.   Past Psychiatric History: MDD and Cocaine Use Disorder, and 2 Suicide Attempts (last- Ingestion 08/2022) and 2 Prior Psychiatric Hospitalizations, and no history of Self Injurious Behavior.   Past Medical History:  Past Medical History:  Diagnosis Date   History of suicide attempt 08/05/2022   2018   Homeless    HTN (hypertension)    Multiple cerebral infarctions (HCC)    Polysubstance abuse (HCC)    Tobacco use disorder 08/05/2022   History reviewed. No pertinent surgical history. Family History: History reviewed. No pertinent family history. Family Psychiatric  History:  Brother, Sister- Crack Cocaine Abuse No Known Diagnosis' or Suicides  Social History:  Social History   Substance and Sexual Activity  Alcohol Use Yes     Social History   Substance and Sexual Activity  Drug Use Yes   Types: Cocaine   Comment: Heroin/Crack    Social History   Socioeconomic History   Marital status: Legally Separated    Spouse name: Not on file   Number of children: Not on file   Years of education: Not on file   Highest education level: Not on file  Occupational History   Not on file  Tobacco Use   Smoking status: Former    Types: Cigarettes   Smokeless tobacco: Never  Vaping Use   Vaping status: Never Used  Substance and Sexual Activity   Alcohol use: Yes   Drug use: Yes    Types: Cocaine    Comment: Heroin/Crack   Sexual activity: Not on file  Other Topics Concern   Not on file  Social History Narrative   Not on file   Social Drivers of Health   Financial Resource Strain: Not on file  Food Insecurity: Food Insecurity Present  (11/21/2023)   Hunger Vital Sign    Worried About Running Out of Food in the Last Year: Often true    Ran Out of Food in the Last Year: Often true  Transportation Needs: Unmet Transportation Needs (11/21/2023)   PRAPARE - Administrator, Civil Service (Medical): Yes    Lack of Transportation (Non-Medical): Yes  Physical Activity: Not on file  Stress: Not on file  Social Connections: Unknown (02/17/2022)   [facility] from [facility]   Social Network    Social Network: Not on file   Additional Social History:                         Sleep: Poor due to withdrawal and back pain   Appetite:  Good  Current Medications: Current Facility-Administered Medications  Medication Dose Route Frequency Provider Last Rate Last Admin   acetaminophen (TYLENOL) tablet 650 mg  650 mg Oral Q6H PRN Ardis Hughs, NP   650 mg at 11/23/23 0839   alum & mag hydroxide-simeth (MAALOX/MYLANTA) 200-200-20 MG/5ML suspension 30 mL  30 mL Oral Q4H PRN Ardis Hughs, NP       amLODipine (NORVASC) tablet 10 mg  10 mg Oral Daily Siearra Amberg, [name], MD       aspirin EC tablet 81 mg  81 mg Oral QHS Lauro Franklin, MD   81 mg at 11/23/23 2125   atorvastatin (LIPITOR) tablet 40 mg  40 mg Oral QHS Lauro Franklin, MD   40 mg at 11/23/23 2125   cloNIDine (CATAPRES) tablet 0.1 mg  0.1 mg Oral TID PRN Lauro Franklin, MD   0.1 mg at 11/23/23 9604   dicyclomine (BENTYL) tablet 20 mg  20 mg Oral Q6H PRN Lauro Franklin, MD       haloperidol (HALDOL) tablet 5 mg  5 mg Oral TID PRN Lauro Franklin, MD       And   diphenhydrAMINE (BENADRYL) capsule 50 mg  50 mg Oral TID PRN Lauro Franklin, MD       haloperidol lactate (HALDOL) injection 5 mg  5 mg Intramuscular TID PRN Lauro Franklin, MD       And   diphenhydrAMINE (BENADRYL) injection 50 mg  50 mg Intramuscular TID PRN Lauro Franklin, MD       And   LORazepam (ATIVAN) injection  2 mg  2 mg Intramuscular TID PRN Lauro Franklin, MD       hydrOXYzine (ATARAX) tablet 25 mg  25 mg Oral TID PRN Ardis Hughs, NP   25 mg at 11/23/23 2125   loperamide (IMODIUM) capsule 2-4 mg  2-4 mg Oral PRN Lauro Franklin, MD       LORazepam (ATIVAN) tablet 1 mg  1 mg Oral Q4H PRN Ardis Hughs, NP   1 mg at 11/23/23 1639   magnesium hydroxide (MILK OF MAGNESIA) suspension 30 mL  30 mL Oral Daily PRN Ardis Hughs, NP  methocarbamol (ROBAXIN) tablet 500 mg  500 mg Oral Q8H PRN Lauro Franklin, MD   500 mg at 11/23/23 2125   naproxen (NAPROSYN) tablet 500 mg  500 mg Oral BID WC Golda Acre, MD   500 mg at 11/23/23 1639   ondansetron (ZOFRAN-ODT) disintegrating tablet 4 mg  4 mg Oral Q6H PRN Lauro Franklin, MD       sertraline (ZOLOFT) tablet 50 mg  50 mg Oral Daily Lauro Franklin, MD   50 mg at 11/23/23 1610    Lab Results:  Results for orders placed or performed during the hospital encounter of 11/21/23 (from the past 48 hours)  Folate     Status: None   Collection Time: 11/24/23  6:33 AM  Result Value Ref Range   Folate 7.2 >5.9 ng/mL    Comment: Performed at [facility], [address] [place], [place] [address]  Basic metabolic panel     Status: None   Collection Time: 11/24/23  6:33 AM  Result Value Ref Range   Sodium 142 135 - 145 mmol/L   Potassium 3.9 3.5 - 5.1 mmol/L   Chloride 110 98 - 111 mmol/L   CO2 26 22 - 32 mmol/L   Glucose, Bld 81 70 - 99 mg/dL    Comment: Glucose reference range applies only to samples taken after fasting for at least 8 hours.   BUN 14 6 - 20 mg/dL   Creatinine, Ser 4.09 0.44 - 1.00 mg/dL   Calcium 9.0 8.9 - 81.1 mg/dL   GFR, Estimated >91 >47 mL/min    Comment: (NOTE) Calculated using the CKD-EPI Creatinine Equation (2021)    Anion gap 6 5 - 15    Comment: Performed at [facility], [address], [place], [place] [address]  Sedimentation rate      Status: None   Collection Time: 11/24/23  6:33 AM  Result Value Ref Range   Sed Rate 11 0 - 22 mm/hr    Comment: Performed at [facility], [address] [place], [place] [address]    Blood Alcohol level:  Lab Results  Component Value Date   [facility] <10 11/21/2023   ETH <10 07/08/2023    Metabolic Disorder Labs: Lab Results  Component Value Date   HGBA1C 5.8 (H) 11/21/2023   MPG 120 11/21/2023   MPG 119.76 07/11/2023   Lab Results  Component Value Date   PROLACTIN 7.6 08/04/2022   Lab Results  Component Value Date   CHOL 205 (H) 11/21/2023   TRIG 99 11/21/2023   HDL 63 11/21/2023   CHOLHDL 3.3 11/21/2023   VLDL 20 11/21/2023   LDLCALC 122 (H) 11/21/2023   LDLCALC 110 (H) 05/20/2023    Physical Findings: AIMS:  , ,  ,  ,    CIWA:    COWS:  COWS Total Score: 0  Musculoskeletal: Strength & Muscle Tone: within normal limits Gait & Station:  laying in bed Patient leans: N/A  Psychiatric Specialty Exam:  Presentation  General Appearance:  Casual  Eye Contact: Fair  Speech: Clear and Coherent; Normal Rate  Speech Volume: Normal  Handedness: Right   Mood and Affect  Mood: Anxious  Affect: Congruent   Thought Process  Thought Processes: Coherent; Goal Directed  Descriptions of Associations:Intact  Orientation:Full (Time, Place and Person)  Thought Content:Logical; WDL  History of Schizophrenia/Schizoaffective disorder:No  Duration of Psychotic Symptoms:No data recorded Hallucinations:Hallucinations: None  Ideas of Reference:None  Suicidal Thoughts:Suicidal  Thoughts: Yes, Passive (fleeting, minimal)  Homicidal Thoughts:Homicidal Thoughts: No   Sensorium  Memory: Immediate Fair; Recent Fair  Judgment: Fair  Insight: Fair   Chartered certified accountant: Fair  Attention Span: Fair  Recall: Fair  Fund of Knowledge: Fair  Language: Good   Psychomotor Activity  Psychomotor  Activity: Psychomotor Activity: Normal   Assets  Assets: Communication Skills; Desire for Improvement; Resilience   Sleep  Sleep: Sleep: Poor (due to withdrawal and back pain) Number of Hours of Sleep: 8.75    Physical Exam: Physical Exam Vitals and nursing note reviewed.  Constitutional:      General: She is not in acute distress.    Appearance: Normal appearance. She is normal weight. She is not ill-appearing or toxic-appearing.  HENT:     Head: Normocephalic and atraumatic.  Pulmonary:     Effort: Pulmonary effort is normal.  Neurological:     Mental Status: She is alert.    Review of Systems  Constitutional:  Positive for chills and diaphoresis.  Respiratory:  Negative for cough and shortness of breath.   Cardiovascular:  Negative for chest pain.  Gastrointestinal:  Negative for abdominal pain, constipation, diarrhea, nausea and vomiting.  Musculoskeletal:  Positive for back pain.  Neurological:  Negative for dizziness, weakness and headaches.  Psychiatric/Behavioral:  Positive for depression and suicidal ideas (fleeting). Negative for hallucinations. The patient is nervous/anxious.    Blood pressure (!) 144/76, pulse 66, temperature 98.5 F (36.9 C), temperature source Oral, resp. rate 12, height 5\' 2"  (1.575 m), weight 67 kg, last menstrual period 11/18/2018, SpO2 100%. Body mass index is 27 kg/m.   Treatment Plan Summary: Daily contact with patient to assess and evaluate symptoms and progress in treatment and Medication management   Bruchy Mikel is a 53 yr old female who presented on 2/16 to [facility] with SI worsened by her substance abuse, she was admitted to [facility] on 2/17.  PPHx is significant for MDD and Cocaine Use Disorder, and 2 Suicide Attempts (last- Ingestion 08/2022) and 2 Prior Psychiatric Hospitalizations, and no history of Self Injurious Behavior.     Ladon is still having withdrawal symptoms which is contributing to her poor sleep.  As her back  pain is a significant factor we will trial gabapentin to address her anxiety and back pain.  She would still ultimately benefit from residential rehab so we will continue to inquire about this.  We will not make any other changes to her medication at this time.  We will continue to monitor.     MDD, Recurrent, Severe, w/out Psychosis: -Continue Zoloft to 50 mg daily for depression -Start Gabapentin 100 mg TID for anxiety and back pain -Continue Agitation Protocol: Haldol/Benadryl/Ativan     Withdrawal: -Continue COWS, last score= 3 @ 0800 2/19 -Continue Imodium 2-4 mg PRN diarrhea -Continue Robaxin 500 mg q8 PRN muscle spasms -Continue Naproxen 500 mg BID PRN pain -Continue Zofran-ODT 4 mg q6 PRN nausea -Continue Bentyl 20 mg q6 PRN spasms   HTN: -Continue Amlodipine 10 mg daily -Continue Clonidine 0.1 mg TID PRN Administer one dose if SBP is over 180 -or- if DBP is over 100      -Continue Lipitor 40 mg QHS -Continue Aspirin EC 81 mg daily -Continue PRN's: Tylenol, Maalox, Atarax, Milk of Magnesia   Lauro Franklin, MD 11/24/2023, 8:46 AM

## 2023-11-24 NOTE — Group Note (Signed)
 Date:  11/24/2023 Time:  9:02 AM  Group Topic/Focus:  Developing a Wellness Toolbox:   The focus of this group is to help patients develop a "wellness toolbox" with skills and strategies to promote recovery upon discharge.    Participation Level:  Did Not Attend   Erasmo Score 11/24/2023, 9:02 AM

## 2023-11-24 NOTE — Progress Notes (Signed)
   11/24/23 2200  Psych Admission Type (Psych Patients Only)  Admission Status Voluntary  Psychosocial Assessment  Patient Complaints Anxiety;Substance abuse  Eye Contact Brief  Facial Expression Flat  Affect Appropriate to circumstance  Speech Logical/coherent  Interaction Minimal;Isolative  Motor Activity Slow  Appearance/Hygiene Unremarkable  Behavior Characteristics Cooperative  Mood Depressed;Anxious  Thought Process  Coherency WDL  Content WDL  Delusions None reported or observed  Perception WDL  Hallucination None reported or observed  Judgment Poor  Confusion Mild  Danger to Self  Current suicidal ideation? Denies  Description of Suicide Plan None  Self-Injurious Behavior No self-injurious ideation or behavior indicators observed or expressed   Agreement Not to Harm Self Yes  Description of Agreement Verbal agreement  Danger to Others  Danger to Others None reported or observed

## 2023-11-24 NOTE — Plan of Care (Signed)
   Problem: Education: Goal: Knowledge of [facility] Education information/materials will improve Outcome: Progressing Goal: Emotional status will improve Outcome: Progressing Goal: Mental status will improve Outcome: Progressing Goal: Verbalization of understanding the information provided will improve Outcome: Progressing

## 2023-11-24 NOTE — [facility] Suicide Risk Assessment (Signed)
 [facility] INPATIENT:  Family/Significant Other Suicide Prevention Education  Suicide Prevention Education:  Patient Refusal for Family/Significant Other Suicide Prevention Education: The patient Alexandra Buckley has refused to provide written consent for family/significant other to be provided Family/Significant Other Suicide Prevention Education during admission and/or prior to discharge.  Physician notified.  Kathi Der 11/24/2023, 12:09 PM

## 2023-11-24 NOTE — Progress Notes (Signed)
   11/23/23 2036  Psych Admission Type (Psych Patients Only)  Admission Status Voluntary  Psychosocial Assessment  Patient Complaints Depression;Anxiety  Eye Contact Brief  Facial Expression Flat  Affect Depressed  Speech Logical/coherent  Interaction Isolative;Minimal  Motor Activity Slow  Appearance/Hygiene Poor hygiene;Layered clothes;Disheveled  Behavior Characteristics Cooperative  Mood Depressed;Anxious  Thought Process  Coherency WDL  Content WDL  Delusions None reported or observed  Perception WDL  Hallucination None reported or observed  Judgment Poor  Confusion Mild  Danger to Self  Current suicidal ideation? Denies  Agreement Not to Harm Self Yes  Description of Agreement verbal  Danger to Others  Danger to Others None reported or observed

## 2023-11-24 NOTE — Progress Notes (Signed)
   11/24/23 0900  Psych Admission Type (Psych Patients Only)  Admission Status Voluntary  Psychosocial Assessment  Patient Complaints Anxiety;Depression  Eye Contact Brief  Facial Expression Flat  Affect Depressed  Speech Logical/coherent  Interaction Isolative;Minimal  Motor Activity Slow  Appearance/Hygiene Layered clothes  Behavior Characteristics Cooperative  Mood Depressed;Anxious  Thought Process  Coherency WDL  Content WDL  Delusions None reported or observed  Perception WDL  Hallucination None reported or observed  Judgment Poor  Confusion Mild  Danger to Self  Current suicidal ideation? Denies  Agreement Not to Harm Self Yes  Description of Agreement verbal  Danger to Others  Danger to Others None reported or observed

## 2023-11-25 DIAGNOSIS — F332 Major depressive disorder, recurrent severe without psychotic features: Secondary | ICD-10-CM

## 2023-11-25 MED ORDER — RISPERIDONE 1 MG PO TABS
1.0000 mg | ORAL_TABLET | Freq: Two times a day (BID) | ORAL | Status: DC
  Administered 2023-11-25 – 2023-11-26 (×3): 1 mg via ORAL
  Filled 2023-11-25 (×2): qty 1
  Filled 2023-11-25: qty 28
  Filled 2023-11-25: qty 1
  Filled 2023-11-25 (×2): qty 28
  Filled 2023-11-25: qty 1
  Filled 2023-11-25: qty 28

## 2023-11-25 NOTE — Group Note (Signed)
 Date:  11/25/2023 Time:  10:12 PM  Group Topic/Focus:  Wrap-Up Group:   The focus of this group is to help patients review their daily goal of treatment and discuss progress on daily workbooks.    Participation Level:  Did Not Attend  Participation Quality:   Did not attend   Affect:  Did not attend   Cognitive:  Did not attend   Insight: None  Engagement in Group:  Did not attend   Modes of Intervention:  Did not attend   Additional Comments:  Patient did not attend wrap up group  Alexandra Buckley 11/25/2023, 10:12 PM

## 2023-11-25 NOTE — Progress Notes (Signed)
   11/25/23 2000  Psych Admission Type (Psych Patients Only)  Admission Status Voluntary  Psychosocial Assessment  Eye Contact Brief  Facial Expression Flat  Affect Appropriate to circumstance  Speech Logical/coherent  Interaction Minimal;Isolative  Motor Activity Slow  Appearance/Hygiene Unremarkable  Behavior Characteristics Appropriate to situation  Mood Depressed  Thought Process  Coherency WDL  Content WDL  Delusions None reported or observed  Perception WDL  Hallucination None reported or observed  Judgment Poor  Confusion Mild  Danger to Self  Current suicidal ideation? Denies  Description of Suicide Plan None  Self-Injurious Behavior No self-injurious ideation or behavior indicators observed or expressed   Agreement Not to Harm Self Yes  Description of Agreement Verbal agreement  Danger to Others  Danger to Others None reported or observed

## 2023-11-25 NOTE — Progress Notes (Signed)
   11/25/23 0800  Psych Admission Type (Psych Patients Only)  Admission Status Voluntary  Psychosocial Assessment  Patient Complaints Substance abuse;Depression;Anxiety  Eye Contact Brief  Facial Expression Flat  Affect Depressed  Speech Logical/coherent  Interaction Minimal  Motor Activity Slow  Appearance/Hygiene Unremarkable  Behavior Characteristics Cooperative;Appropriate to situation;Fidgety  Mood Depressed;Anxious  Thought Process  Coherency WDL  Content WDL  Delusions None reported or observed  Perception WDL  Hallucination None reported or observed  Judgment Poor  Confusion None  Danger to Self  Current suicidal ideation? Denies  Self-Injurious Behavior No self-injurious ideation or behavior indicators observed or expressed   Agreement Not to Harm Self Yes  Description of Agreement verbal  Danger to Others  Danger to Others None reported or observed

## 2023-11-25 NOTE — Progress Notes (Signed)
     11/25/2023       1:04 PM   [name]   Type of Note: [facility]   Spoke with Marcelino Duster at [facility], pt was accepted to their facility and is due to admit there on Monday 11/29/23 by 9:00AM. Pt needs to arrive with 14 day supply of medications and 30 day scripts. MD and patient aware and agreeable.   Weekend CSW team to complete discharge Sunday evening and arrange taxi for Monday at 8:00AM.   Signed:  Sharetta Ricchio, LCSW-A 11/25/2023  1:04 PM

## 2023-11-25 NOTE — Plan of Care (Signed)
  Problem: Education: Goal: Knowledge of [facility] Education information/materials will improve Outcome: Progressing Goal: Emotional status will improve Outcome: Progressing Goal: Mental status will improve Outcome: Progressing Goal: Verbalization of understanding the information provided will improve Outcome: Progressing   Problem: Activity: Goal: Interest or engagement in activities will improve Outcome: Not Progressing

## 2023-11-25 NOTE — Progress Notes (Addendum)
 [facility] MD Progress Note  11/25/2023 10:06 AM Alexandra Buckley  MRN:  [id number] Subjective:   Alexandra Buckley is a 53 yr old female who presented on 2/16 to [facility] with SI worsened by her substance abuse, she was admitted to [facility] on 2/17. PPHx is significant for MDD and Cocaine Use Disorder, and 2 Suicide Attempts (last- Ingestion 08/2022) and 2 Prior Psychiatric Hospitalizations, and no history of Self Injurious Behavior.    Case was discussed in the multidisciplinary team. MAR was reviewed and patient was compliant with medications.  She received PRN Hydroxyzine and Robaxin yesterday.   Psychiatric Team made the following recommendations yesterday: -Stop Zoloft -Start Cymbalta 30 mg daily for depression -Start Risperdal 0.5 mg BID for psychosis -Start Gabapentin 100 mg TID for anxiety and back pain    On interview today patient reports she slept  better  last night due to less back pain.  She reports her appetite is doing good.  She reports still having SI.  She reports no HI, or VH.  She reports continuing to have AH of voices telling her to hurt herself.  She reports no Paranoia or Ideas of Reference.  She reports no issues with her medications.  She reports that she is continuing to have sweating.  She reports that the Risperdal has not had any effect so far.  Discussed with her that we would increase her Risperdal and she was agreeable.  She reports that her back pain is improved.  She reports her anxiety has had small improvement with the Gabapentin.  She reports that she has thought about and would like to discuss Residential Treatment options.  Discussed with her that we would ask Social Work to talk with her about this.  She reports no other concerns at present.    Principal Problem: MDD (major depressive disorder), recurrent severe, without psychosis (HCC) Diagnosis: Principal Problem:   MDD (major depressive disorder), recurrent severe, without psychosis (HCC) Active Problems:    Essential hypertension  Total Time spent with patient:  I personally spent 35 minutes on the unit in direct patient care. The direct patient care time included face-to-face time with the patient, reviewing the patient's chart, communicating with other professionals, and coordinating care. Greater than 50% of this time was spent in counseling or coordinating care with the patient regarding goals of hospitalization, psycho-education, and discharge planning needs.   Past Psychiatric History: MDD and Cocaine Use Disorder, and 2 Suicide Attempts (last- Ingestion 08/2022) and 2 Prior Psychiatric Hospitalizations, and no history of Self Injurious Behavior.   Past Medical History:  Past Medical History:  Diagnosis Date   History of suicide attempt 08/05/2022   2018   Homeless    HTN (hypertension)    Multiple cerebral infarctions (HCC)    Polysubstance abuse (HCC)    Tobacco use disorder 08/05/2022   History reviewed. No pertinent surgical history. Family History: History reviewed. No pertinent family history. Family Psychiatric  History:  Brother, Sister- Crack Cocaine Abuse No Known Diagnosis' or Suicides  Social History:  Social History   Substance and Sexual Activity  Alcohol Use Yes     Social History   Substance and Sexual Activity  Drug Use Yes   Types: Cocaine   Comment: Heroin/Crack    Social History   Socioeconomic History   Marital status: Legally Separated    Spouse name: Not on file   Number of children: Not on file   Years of education: Not on file   Highest education level:  Not on file  Occupational History   Not on file  Tobacco Use   Smoking status: Former    Types: Cigarettes   Smokeless tobacco: Never  Vaping Use   Vaping status: Never Used  Substance and Sexual Activity   Alcohol use: Yes   Drug use: Yes    Types: Cocaine    Comment: Heroin/Crack   Sexual activity: Not on file  Other Topics Concern   Not on file  Social History Narrative   Not  on file   Social Drivers of Health   Financial Resource Strain: Not on file  Food Insecurity: Food Insecurity Present (11/21/2023)   Hunger Vital Sign    Worried About Running Out of Food in the Last Year: Often true    Ran Out of Food in the Last Year: Often true  Transportation Needs: Unmet Transportation Needs (11/21/2023)   PRAPARE - Administrator, Civil Service (Medical): Yes    Lack of Transportation (Non-Medical): Yes  Physical Activity: Not on file  Stress: Not on file  Social Connections: Unknown (02/17/2022)   Received from [facility], [facility]   Social Network    Social Network: Not on file   Additional Social History:                         Sleep:  better  due to decreased back pain   Appetite:  Good  Current Medications: Current Facility-Administered Medications  Medication Dose Route Frequency Provider Last Rate Last Admin   acetaminophen (TYLENOL) tablet 650 mg  650 mg Oral Q6H PRN Ardis Hughs, NP   650 mg at 11/23/23 0839   alum & mag hydroxide-simeth (MAALOX/MYLANTA) 200-200-20 MG/5ML suspension 30 mL  30 mL Oral Q4H PRN Ardis Hughs, NP       amLODipine (NORVASC) tablet 10 mg  10 mg Oral Daily Lauro Franklin, MD   10 mg at 11/25/23 1610   aspirin EC tablet 81 mg  81 mg Oral QHS Lauro Franklin, MD   81 mg at 11/24/23 2109   atorvastatin (LIPITOR) tablet 40 mg  40 mg Oral QHS Lauro Franklin, MD   40 mg at 11/24/23 2109   cloNIDine (CATAPRES) tablet 0.1 mg  0.1 mg Oral TID PRN Lauro Franklin, MD   0.1 mg at 11/23/23 9604   dicyclomine (BENTYL) tablet 20 mg  20 mg Oral Q6H PRN Lauro Franklin, MD       haloperidol (HALDOL) tablet 5 mg  5 mg Oral TID PRN Lauro Franklin, MD       And   diphenhydrAMINE (BENADRYL) capsule 50 mg  50 mg Oral TID PRN Lauro Franklin, MD       haloperidol lactate (HALDOL) injection 5 mg  5 mg Intramuscular TID PRN Lauro Franklin, MD       And    diphenhydrAMINE (BENADRYL) injection 50 mg  50 mg Intramuscular TID PRN Lauro Franklin, MD       And   LORazepam (ATIVAN) injection 2 mg  2 mg Intramuscular TID PRN Lauro Franklin, MD       DULoxetine (CYMBALTA) DR capsule 30 mg  30 mg Oral Daily Golda Acre, MD   30 mg at 11/25/23 0802   gabapentin (NEURONTIN) capsule 100 mg  100 mg Oral TID Lauro Franklin, MD   100 mg at 11/25/23 0801   hydrOXYzine (ATARAX) tablet 25 mg  25 mg Oral TID PRN Ardis Hughs, NP   25 mg at 11/24/23 2109   loperamide (IMODIUM) capsule 2-4 mg  2-4 mg Oral PRN Lauro Franklin, MD       magnesium hydroxide (MILK OF MAGNESIA) suspension 30 mL  30 mL Oral Daily PRN Ardis Hughs, NP       methocarbamol (ROBAXIN) tablet 500 mg  500 mg Oral Q8H PRN Lauro Franklin, MD   500 mg at 11/24/23 2109   multivitamins with iron tablet 1 tablet  1 tablet Oral Daily Golda Acre, MD   1 tablet at 11/25/23 0801   naproxen (NAPROSYN) tablet 500 mg  500 mg Oral BID WC Golda Acre, MD   500 mg at 11/25/23 0802   ondansetron (ZOFRAN-ODT) disintegrating tablet 4 mg  4 mg Oral Q6H PRN Lauro Franklin, MD       risperiDONE (RISPERDAL) tablet 1 mg  1 mg Oral BID Lauro Franklin, MD       Vitamin D (Ergocalciferol) (DRISDOL) 1.25 MG (50000 UNIT) capsule 50,000 Units  50,000 Units Oral Q7 days Golda Acre, MD   50,000 Units at 11/24/23 1146   vitamin D3 (CHOLECALCIFEROL) tablet 2,000 Units  2,000 Units Oral BID Golda Acre, MD   2,000 Units at 11/25/23 0801    Lab Results:  Results for orders placed or performed during the hospital encounter of 11/21/23 (from the past 48 hours)  Folate     Status: None   Collection Time: 11/24/23  6:33 AM  Result Value Ref Range   Folate 7.2 >5.9 ng/mL    Comment: Performed at [facility], [address] [place], [place] [address]  RPR     Status: None   Collection Time: 11/24/23  6:33 AM  Result Value Ref  Range   RPR Ser Ql NON REACTIVE NON REACTIVE    Comment: Performed at [facility], [address], [place], [place] [address]  VITAMIN D 25 Hydroxy (Vit-D Deficiency, Fractures)     Status: Abnormal   Collection Time: 11/24/23  6:33 AM  Result Value Ref Range   Vit D, 25-Hydroxy 10.71 (L) 30 - 100 ng/mL    Comment: (NOTE) Vitamin D deficiency has been defined by the Institute of Medicine  and an Endocrine Society practice guideline as a level of serum 25-OH  vitamin D less than 20 ng/mL (1,2). The Endocrine Society went on to  further define vitamin D insufficiency as a level between 21 and 29  ng/mL (2).  1. IOM (Institute of Medicine). 2010. Dietary reference intakes for  calcium and D. [place]: [employer]. 2. Holick MF, Binkley Robeson, Bischoff-Ferrari HA, et al. Evaluation,  treatment, and prevention of vitamin D deficiency: an Endocrine  Society clinical practice guideline, JCEM. 2011 Jul; 96(7): 1911-30.  Performed at [facility], [address], [place], [place] [address]   Basic metabolic panel     Status: None   Collection Time: 11/24/23  6:33 AM  Result Value Ref Range   Sodium 142 135 - 145 mmol/L   Potassium 3.9 3.5 - 5.1 mmol/L   Chloride 110 98 - 111 mmol/L   CO2 26 22 - 32 mmol/L   Glucose, Bld 81 70 - 99 mg/dL    Comment: Glucose reference range applies only to samples taken after fasting for at least 8 hours.   BUN 14 6 - 20 mg/dL   Creatinine,  Ser 0.62 0.44 - 1.00 mg/dL   Calcium 9.0 8.9 - 16.1 mg/dL   GFR, Estimated >09 >60 mL/min    Comment: (NOTE) Calculated using the CKD-EPI Creatinine Equation (2021)    Anion gap 6 5 - 15    Comment: Performed at [facility], [address] [place], [place] [address]  Sedimentation rate     Status: None   Collection Time: 11/24/23  6:33 AM  Result Value Ref Range   Sed Rate 11 0 - 22 mm/hr    Comment: Performed at [facility], [address] [place], [place] [address]  C-reactive protein     Status: None   Collection Time: 11/24/23  6:33 AM  Result Value Ref Range   CRP 0.8 <1.0 mg/dL    Comment: Performed at [facility], [address], [place], [place] [address]    Blood Alcohol level:  Lab Results  Component Value Date   [facility] <10 11/21/2023   ETH <10 07/08/2023    Metabolic Disorder Labs: Lab Results  Component Value Date   HGBA1C 5.8 (H) 11/21/2023   MPG 120 11/21/2023   MPG 119.76 07/11/2023   Lab Results  Component Value Date   PROLACTIN 7.6 08/04/2022   Lab Results  Component Value Date   CHOL 205 (H) 11/21/2023   TRIG 99 11/21/2023   HDL 63 11/21/2023   CHOLHDL 3.3 11/21/2023   VLDL 20 11/21/2023   LDLCALC 122 (H) 11/21/2023   LDLCALC 110 (H) 05/20/2023    Physical Findings: AIMS:  , ,  ,  ,    CIWA:    COWS:  COWS Total Score: 5  Musculoskeletal: Strength & Muscle Tone: within normal limits Gait & Station: normal Patient leans: N/A  Psychiatric Specialty Exam:  Presentation  General Appearance:  Disheveled  Eye Contact: Fair  Speech: Clear and Coherent; Normal Rate  Speech Volume: Normal  Handedness: Right   Mood and Affect  Mood: Anxious; Depressed  Affect: Congruent   Thought Process  Thought Processes: Coherent  Descriptions of Associations:Intact  Orientation:Full (Time, Place and Person)  Thought Content:WDL  History of Schizophrenia/Schizoaffective disorder:No  Duration of Psychotic Symptoms:No data recorded Hallucinations:Hallucinations: Auditory Description of Auditory Hallucinations: voices telling her to kill herself  Ideas of Reference:None  Suicidal Thoughts:Suicidal Thoughts: Yes, Passive  Homicidal Thoughts:Homicidal Thoughts: No   Sensorium  Memory: Immediate Fair; Recent Fair  Judgment: Fair  Insight: Fair   Art therapist  Concentration: Fair  Attention Span: Fair  Recall: Fair  Fund of  Knowledge: Fair  Language: Good   Psychomotor Activity  Psychomotor Activity: Psychomotor Activity: Normal   Assets  Assets: Communication Skills; Desire for Improvement; Resilience   Sleep  Sleep: Sleep: Fair Number of Hours of Sleep: 9.5    Physical Exam: Physical Exam Vitals and nursing note reviewed.  Constitutional:      General: She is not in acute distress.    Appearance: Normal appearance. She is normal weight. She is not ill-appearing or toxic-appearing.  HENT:     Head: Normocephalic and atraumatic.  Pulmonary:     Effort: Pulmonary effort is normal.  Musculoskeletal:        General: Normal range of motion.  Neurological:     General: No focal deficit present.     Mental Status: She is alert.    Review of Systems  Constitutional:  Positive for diaphoresis.  Respiratory:  Negative for cough and shortness of breath.   Cardiovascular:  Negative for chest pain.  Gastrointestinal:  Negative for abdominal pain, constipation, diarrhea, nausea and vomiting.  Musculoskeletal:  Positive for back pain (improved).  Neurological:  Negative for dizziness, weakness and headaches.  Psychiatric/Behavioral:  Positive for depression, hallucinations (AH) and suicidal ideas. The patient is nervous/anxious.    Blood pressure (!) 144/88, pulse 72, temperature 98.3 F (36.8 C), temperature source Oral, resp. rate 12, height 5\' 2"  (1.575 m), weight 67 kg, last menstrual period 11/18/2018, SpO2 98%. Body mass index is 27 kg/m.   Treatment Plan Summary: Daily contact with patient to assess and evaluate symptoms and progress in treatment and Medication management   Alisson Rozell is a 53 yr old female who presented on 2/16 to [facility] with SI worsened by her substance abuse, she was admitted to [facility] on 2/17.  PPHx is significant for MDD and Cocaine Use Disorder, and 2 Suicide Attempts (last- Ingestion 08/2022) and 2 Prior Psychiatric Hospitalizations, and no history of Self  Injurious Behavior.     Pearlie has had some improvement in back pain with the starting of Gabapentin and Cymbalta yesterday and minimal improvement in anxiety.  She continues to have significant depressive symptoms.  She has tolerated starting the Risperdal but reports no change in AH yet so we will increase her dose starting this evening.  She is interested in residential rehab and appreciate Social Works assistance with this.  We will not make any other changes to her medications at this time.  We will continue to monitor.       MDD, Recurrent, Severe, w/ Psychosis: -Continue Cymbalta 30 mg daily for depression -Continue Gabapentin 100 mg TID for anxiety and back pain -Increase Risperdal to 1 mg BID for psychosis this evening -Continue Agitation Protocol: Haldol/Benadryl/Ativan     Withdrawal: -Continue COWS, last score= 5 @ 0800 2/20 -Continue Imodium 2-4 mg PRN diarrhea -Continue Robaxin 500 mg q8 PRN muscle spasms -Continue Naproxen 500 mg BID PRN pain -Continue Zofran-ODT 4 mg q6 PRN nausea -Continue Bentyl 20 mg q6 PRN spasms   HTN: -Continue Amlodipine 10 mg daily -Continue Clonidine 0.1 mg TID PRN Administer one dose if SBP is over 180 -or- if DBP is over 100      -Continue Lipitor 40 mg QHS -Continue Aspirin EC 81 mg daily -Continue PRN's: Tylenol, Maalox, Atarax, Milk of Magnesia   Lauro Franklin, MD 11/25/2023, 10:06 AM

## 2023-11-25 NOTE — Group Note (Signed)
 LCSW Group Therapy Note   Group Date: 11/25/2023 Start Time: 1100 End Time: 1200  Participation:  did not attend    Type of Therapy:  Group Therapy   Topic:  Finding Balance: Using Wise Mind for Thoughtful Decisions  Objective: This class aims to help participants understand the concept of [place] Mind and apply it in real-life situations for more balanced, thoughtful decision-making. Participants will gain tools to manage emotions, consider logic, and find a middle ground for healthier responses and outcomes.  Goals: 1.  Understand Wise Mind: Learn the difference between Emotional Mind, Reasonable Mind, and [place] Mind, and how [place] Mind helps balance emotions and logic for thoughtful decisions. 2.  Recognize Emotional and Reasonable Mind: Identify signs of Emotional Mind and Reasonable Mind in personal reactions and how to shift into Wise Mind for balanced responses. 3.  Practice Wise Mind: Engage in scenarios and activities to apply [employer], combining emotions and logic for balanced, thoughtful decisions.  Summary In this class, we explored Wise Mind as the balance between Emotional Mind (impulsive reactions) and Reasonable Mind (logic-focused responses). We discussed how to identify when we're in each state and practiced using Wise Mind to make more balanced, thoughtful decisions in everyday situations, improving decision-making and relationships.  Therapeutic Modalities This class incorporates Dialectical Behavior Therapy (DBT), mindfulness techniques, and emotion regulation strategies to help participants balance emotional and logical responses in their decision-making process.     Alla Feeling, LCSWA 11/25/2023  7:55 PM

## 2023-11-25 NOTE — Progress Notes (Signed)
     11/25/2023       10:48 AM   [name]   Type of Note: [facility]  Pt reports being interested in substance use treatment this morning. [facility] referral has been faxed. Will continue to assist as needed.  Signed:  Bryton Romagnoli, LCSW-A 11/25/2023  10:48 AM

## 2023-11-25 NOTE — Plan of Care (Signed)
  Problem: Education: Goal: Emotional status will improve Outcome: Progressing Goal: Mental status will improve Outcome: Progressing   Problem: Activity: Goal: Interest or engagement in activities will improve Outcome: Progressing   Problem: Coping: Goal: Ability to verbalize frustrations and anger appropriately will improve Outcome: Progressing   Problem: Health Behavior/Discharge Planning: Goal: Compliance with treatment plan for underlying cause of condition will improve Outcome: Progressing   Problem: Safety: Goal: Periods of time without injury will increase Outcome: Progressing

## 2023-11-26 ENCOUNTER — Encounter (HOSPITAL_COMMUNITY): Payer: Self-pay

## 2023-11-26 DIAGNOSIS — F332 Major depressive disorder, recurrent severe without psychotic features: Secondary | ICD-10-CM | POA: Diagnosis not present

## 2023-11-26 MED ORDER — RISPERIDONE 2 MG PO TABS
2.0000 mg | ORAL_TABLET | Freq: Every day | ORAL | Status: DC
  Administered 2023-11-26: 2 mg via ORAL
  Filled 2023-11-26 (×3): qty 1

## 2023-11-26 NOTE — Progress Notes (Signed)
 [facility] MD Progress Note  11/26/2023 6:51 PM Alexandra Buckley  MRN:  [id number] Subjective:   Alexandra Buckley is a 53 yr old female who presented on 2/16 to [facility] with SI worsened by her substance abuse, she was admitted to [facility] on 2/17. PPHx is significant for MDD and Cocaine Use Disorder, and 2 Suicide Attempts (last- Ingestion 08/2022) and 2 Prior Psychiatric Hospitalizations, and no history of Self Injurious Behavior.    Case was discussed in the multidisciplinary team. MAR was reviewed and patient was compliant with medications.    Pt continues to complain of AH.  Pain better but still present.  Mood is brighter today.  Will increase Risperdal to address AH.   Principal Problem: MDD (major depressive disorder), recurrent severe, without psychosis (HCC) Diagnosis: Principal Problem:   MDD (major depressive disorder), recurrent severe, without psychosis (HCC) Active Problems:   Essential hypertension  Total Time spent with patient:  I personally spent 35 minutes on the unit in direct patient care. The direct patient care time included face-to-face time with the patient, reviewing the patient's chart, communicating with other professionals, and coordinating care. Greater than 50% of this time was spent in counseling or coordinating care with the patient regarding goals of hospitalization, psycho-education, and discharge planning needs.   Past Psychiatric History: MDD and Cocaine Use Disorder, and 2 Suicide Attempts (last- Ingestion 08/2022) and 2 Prior Psychiatric Hospitalizations, and no history of Self Injurious Behavior.   Past Medical History:  Past Medical History:  Diagnosis Date   History of suicide attempt 08/05/2022   2018   Homeless    HTN (hypertension)    Multiple cerebral infarctions (HCC)    Polysubstance abuse (HCC)    Tobacco use disorder 08/05/2022   History reviewed. No pertinent surgical history. Family History: History reviewed. No pertinent family history. Family  Psychiatric  History:  Brother, Sister- Crack Cocaine Abuse No Known Diagnosis' or Suicides  Social History:  Social History   Substance and Sexual Activity  Alcohol Use Yes     Social History   Substance and Sexual Activity  Drug Use Yes   Types: Cocaine   Comment: Heroin/Crack    Social History   Socioeconomic History   Marital status: Legally Separated    Spouse name: Not on file   Number of children: Not on file   Years of education: Not on file   Highest education level: Not on file  Occupational History   Not on file  Tobacco Use   Smoking status: Former    Types: Cigarettes   Smokeless tobacco: Never  Vaping Use   Vaping status: Never Used  Substance and Sexual Activity   Alcohol use: Yes   Drug use: Yes    Types: Cocaine    Comment: Heroin/Crack   Sexual activity: Not on file  Other Topics Concern   Not on file  Social History Narrative   Not on file   Social Drivers of Health   Financial Resource Strain: Not on file  Food Insecurity: Food Insecurity Present (11/21/2023)   Hunger Vital Sign    Worried About Running Out of Food in the Last Year: Often true    Ran Out of Food in the Last Year: Often true  Transportation Needs: Unmet Transportation Needs (11/21/2023)   PRAPARE - Administrator, Civil Service (Medical): Yes    Lack of Transportation (Non-Medical): Yes  Physical Activity: Not on file  Stress: Not on file  Social Connections: Unknown (02/17/2022)  Received from [employer], [facility]   Social Network    Social Network: Not on file   Additional Social History:                         Sleep:  better  due to decreased back pain   Appetite:  Good  Current Medications: Current Facility-Administered Medications  Medication Dose Route Frequency Provider Last Rate Last Admin   acetaminophen (TYLENOL) tablet 650 mg  650 mg Oral Q6H PRN Ardis Hughs, NP   650 mg at 11/26/23 1655   alum & mag  hydroxide-simeth (MAALOX/MYLANTA) 200-200-20 MG/5ML suspension 30 mL  30 mL Oral Q4H PRN Ardis Hughs, NP       amLODipine (NORVASC) tablet 10 mg  10 mg Oral Daily Lauro Franklin, MD   10 mg at 11/26/23 6213   aspirin EC tablet 81 mg  81 mg Oral QHS Lauro Franklin, MD   81 mg at 11/25/23 2132   atorvastatin (LIPITOR) tablet 40 mg  40 mg Oral QHS Lauro Franklin, MD   40 mg at 11/25/23 2132   cloNIDine (CATAPRES) tablet 0.1 mg  0.1 mg Oral TID PRN Lauro Franklin, MD   0.1 mg at 11/23/23 0865   dicyclomine (BENTYL) tablet 20 mg  20 mg Oral Q6H PRN Lauro Franklin, MD       haloperidol (HALDOL) tablet 5 mg  5 mg Oral TID PRN Lauro Franklin, MD       And   diphenhydrAMINE (BENADRYL) capsule 50 mg  50 mg Oral TID PRN Lauro Franklin, MD       haloperidol lactate (HALDOL) injection 5 mg  5 mg Intramuscular TID PRN Lauro Franklin, MD       And   diphenhydrAMINE (BENADRYL) injection 50 mg  50 mg Intramuscular TID PRN Lauro Franklin, MD       And   LORazepam (ATIVAN) injection 2 mg  2 mg Intramuscular TID PRN Lauro Franklin, MD       DULoxetine (CYMBALTA) DR capsule 30 mg  30 mg Oral Daily Golda Acre, MD   30 mg at 11/26/23 0805   gabapentin (NEURONTIN) capsule 100 mg  100 mg Oral TID Lauro Franklin, MD   100 mg at 11/26/23 1203   hydrOXYzine (ATARAX) tablet 25 mg  25 mg Oral TID PRN Ardis Hughs, NP   25 mg at 11/24/23 2109   loperamide (IMODIUM) capsule 2-4 mg  2-4 mg Oral PRN Lauro Franklin, MD       magnesium hydroxide (MILK OF MAGNESIA) suspension 30 mL  30 mL Oral Daily PRN Ardis Hughs, NP       methocarbamol (ROBAXIN) tablet 500 mg  500 mg Oral Q8H PRN Lauro Franklin, MD   500 mg at 11/25/23 2132   multivitamins with iron tablet 1 tablet  1 tablet Oral Daily Golda Acre, MD   1 tablet at 11/26/23 0805   naproxen (NAPROSYN) tablet 500 mg  500 mg Oral BID WC Golda Acre, MD   500  mg at 11/26/23 1654   ondansetron (ZOFRAN-ODT) disintegrating tablet 4 mg  4 mg Oral Q6H PRN Lauro Franklin, MD       risperiDONE (RISPERDAL) tablet 2 mg  2 mg Oral QHS Golda Acre, MD       Vitamin D (Ergocalciferol) (DRISDOL) 1.25 MG (50000 UNIT) capsule 50,000 Units  50,000 Units Oral Q7 days Golda Acre, MD   50,000 Units at 11/24/23 1146   vitamin D3 (CHOLECALCIFEROL) tablet 2,000 Units  2,000 Units Oral BID Golda Acre, MD   2,000 Units at 11/26/23 1654    Lab Results:  No results found for this or any previous visit (from the past 48 hours).   Blood Alcohol level:  Lab Results  Component Value Date   ETH <10 11/21/2023   ETH <10 07/08/2023    Metabolic Disorder Labs: Lab Results  Component Value Date   HGBA1C 5.8 (H) 11/21/2023   MPG 120 11/21/2023   MPG 119.76 07/11/2023   Lab Results  Component Value Date   PROLACTIN 7.6 08/04/2022   Lab Results  Component Value Date   CHOL 205 (H) 11/21/2023   TRIG 99 11/21/2023   HDL 63 11/21/2023   CHOLHDL 3.3 11/21/2023   VLDL 20 11/21/2023   LDLCALC 122 (H) 11/21/2023   LDLCALC 110 (H) 05/20/2023    Physical Findings: AIMS:  , ,  ,  ,    CIWA:    COWS:  COWS Total Score: 0  Musculoskeletal: Strength & Muscle Tone: within normal limits Gait & Station: normal Patient leans: N/A  Psychiatric Specialty Exam:  Presentation  General Appearance:  Disheveled  Eye Contact: Fair  Speech: Clear and Coherent; Normal Rate  Speech Volume: Normal  Handedness: Right   Mood and Affect  Mood: Anxious; Depressed  Affect: Congruent   Thought Process  Thought Processes: Coherent  Descriptions of Associations:Intact  Orientation:Full (Time, Place and Person)  Thought Content:WDL  History of Schizophrenia/Schizoaffective disorder:No  Duration of Psychotic Symptoms:No data recorded Hallucinations:Hallucinations: Auditory Description of Auditory Hallucinations: voices telling her to  kill herself  Ideas of Reference:None  Suicidal Thoughts:Suicidal Thoughts: Yes, Passive  Homicidal Thoughts:Homicidal Thoughts: No   Sensorium  Memory: Immediate Fair; Recent Fair  Judgment: Fair  Insight: Fair   Art therapist  Concentration: Fair  Attention Span: Fair  Recall: Fair  Fund of Knowledge: Fair  Language: Good   Psychomotor Activity  Psychomotor Activity: Psychomotor Activity: Normal   Assets  Assets: Communication Skills; Desire for Improvement; Resilience   Sleep  Sleep: Sleep: Fair Number of Hours of Sleep: 9.5    Physical Exam: General: Sitting comfortably. NAD. HEENT: Normocephalic, atraumatic, MMM, EMOI Lungs: no increased work of breathing noted Heart: no cyanosis Abdomen: Non distended Musculoskeletal: FROM. No obvious deformities Skin: Warm, dry, intact. No rashes noted Neuro: No obvious focal deficits.  Gait and station are normal  Review of Systems  Constitutional: Negative.   HENT: Negative.    Eyes: Negative.   Respiratory: Negative.    Cardiovascular: Negative.   Gastrointestinal: Negative.   Genitourinary: Negative.   Skin: Negative.   Neurological: Negative.   Psychiatric/Behavioral:  Positive for AH.     Blood pressure (!) 124/53, pulse 69, temperature 98.3 F (36.8 C), temperature source Oral, resp. rate 12, height 5\' 2"  (1.575 m), weight 67 kg, last menstrual period 11/18/2018, SpO2 100%. Body mass index is 27 kg/m.   Treatment Plan Summary: Daily contact with patient to assess and evaluate symptoms and progress in treatment and Medication management   Alexandra Buckley is a 53 yr old female who presented on 2/16 to [facility] with SI worsened by her substance abuse, she was admitted to [facility] on 2/17.  PPHx is significant for MDD and Cocaine Use Disorder, and 2 Suicide Attempts (last- Ingestion 08/2022) and 2 Prior Psychiatric Hospitalizations, and no history of  Self Injurious Behavior.       MDD,  Recurrent, Severe, w/ Psychosis: -Continue Cymbalta 30 mg daily for depression -Continue Gabapentin 100 mg TID for anxiety and back pain -Increase Risperdal to 1 mg / 2 mg today -Continue Agitation Protocol: Haldol/Benadryl/Ativan     Withdrawal: -Continue COWS, last score= 5 @ 0800 2/20 -Continue Imodium 2-4 mg PRN diarrhea -Continue Robaxin 500 mg q8 PRN muscle spasms -Continue Naproxen 500 mg BID PRN pain -Continue Zofran-ODT 4 mg q6 PRN nausea -Continue Bentyl 20 mg q6 PRN spasms   HTN: -Continue Amlodipine 10 mg daily -Continue Clonidine 0.1 mg TID PRN Administer one dose if SBP is over 180 -or- if DBP is over 100      -Continue Lipitor 40 mg QHS -Continue Aspirin EC 81 mg daily -Continue PRN's: Tylenol, Maalox, Atarax, Milk of Magnesia   Golda Acre, MD 11/26/2023, 6:51 PM

## 2023-11-26 NOTE — Group Note (Signed)
 Recreation Therapy Group Note   Group Topic:Team Building  Group Date: 11/26/2023 Start Time: 0930 End Time: 0956 Facilitators: Bastian Andreoli-McCall, LRT,CTRS Location: [address] Dayroom   Group Topic: Communication, Team Building, Problem Solving  Goal Area(s) Addresses:  Patient will effectively work with peer towards shared goal.  Patient will identify skills used to make activity successful.  Patient will identify how skills used during activity can be used to reach post d/c goals.   Intervention: STEM Activity  Activity: Stage manager. In teams of 3-5, patients were given 12 plastic drinking straws and an equal length of masking tape. Using the materials provided, patients were asked to build a landing pad to catch a golf ball dropped from approximately 5 feet in the air. All materials were required to be used by the team in their design. LRT facilitated post-activity discussion.  Education: Pharmacist, community, Scientist, physiological, Discharge Planning   Education Outcome: Acknowledges education/In group clarification offered/Needs additional education.    Affect/Mood: N/A   Participation Level: Did not attend    Clinical Observations/Individualized Feedback:      Plan: Continue to engage patient in RT group sessions 2-3x/week.   Bevely Hackbart-McCall, LRT,CTRS 11/26/2023 12:15 PM

## 2023-11-26 NOTE — Plan of Care (Signed)
  Problem: Education: Goal: Emotional status will improve Outcome: Progressing Goal: Mental status will improve Outcome: Progressing Goal: Verbalization of understanding the information provided will improve Outcome: Progressing   Problem: Activity: Goal: Interest or engagement in activities will improve Outcome: Progressing   Problem: Coping: Goal: Ability to verbalize frustrations and anger appropriately will improve Outcome: Progressing   Problem: Physical Regulation: Goal: Ability to maintain clinical measurements within normal limits will improve Outcome: Progressing   Problem: Safety: Goal: Periods of time without injury will increase Outcome: Progressing

## 2023-11-26 NOTE — BH IP Treatment Plan (Signed)
 Interdisciplinary Treatment and Diagnostic Plan Update  11/26/2023 Time of Session: 12:10 PM - UPDATE Alexandra Buckley MRN: [id number]  Principal Diagnosis: MDD (major depressive disorder), recurrent severe, without psychosis (HCC)  Secondary Diagnoses: Principal Problem:   MDD (major depressive disorder), recurrent severe, without psychosis (HCC) Active Problems:   Essential hypertension   Current Medications:  Current Facility-Administered Medications  Medication Dose Route Frequency Provider Last Rate Last Admin   acetaminophen (TYLENOL) tablet 650 mg  650 mg Oral Q6H PRN Ardis Hughs, NP   650 mg at 11/26/23 1655   alum & mag hydroxide-simeth (MAALOX/MYLANTA) 200-200-20 MG/5ML suspension 30 mL  30 mL Oral Q4H PRN Ardis Hughs, NP       amLODipine (NORVASC) tablet 10 mg  10 mg Oral Daily Lauro Franklin, MD   10 mg at 11/26/23 8657   aspirin EC tablet 81 mg  81 mg Oral QHS Lauro Franklin, MD   81 mg at 11/25/23 2132   atorvastatin (LIPITOR) tablet 40 mg  40 mg Oral QHS Lauro Franklin, MD   40 mg at 11/25/23 2132   cloNIDine (CATAPRES) tablet 0.1 mg  0.1 mg Oral TID PRN Lauro Franklin, MD   0.1 mg at 11/23/23 8469   dicyclomine (BENTYL) tablet 20 mg  20 mg Oral Q6H PRN Lauro Franklin, MD       haloperidol (HALDOL) tablet 5 mg  5 mg Oral TID PRN Lauro Franklin, MD       And   diphenhydrAMINE (BENADRYL) capsule 50 mg  50 mg Oral TID PRN Lauro Franklin, MD       haloperidol lactate (HALDOL) injection 5 mg  5 mg Intramuscular TID PRN Lauro Franklin, MD       And   diphenhydrAMINE (BENADRYL) injection 50 mg  50 mg Intramuscular TID PRN Lauro Franklin, MD       And   LORazepam (ATIVAN) injection 2 mg  2 mg Intramuscular TID PRN Lauro Franklin, MD       DULoxetine (CYMBALTA) DR capsule 30 mg  30 mg Oral Daily Golda Acre, MD   30 mg at 11/26/23 0805   gabapentin (NEURONTIN) capsule 100 mg  100 mg Oral TID  Lauro Franklin, MD   100 mg at 11/26/23 1203   hydrOXYzine (ATARAX) tablet 25 mg  25 mg Oral TID PRN Ardis Hughs, NP   25 mg at 11/24/23 2109   loperamide (IMODIUM) capsule 2-4 mg  2-4 mg Oral PRN Lauro Franklin, MD       magnesium hydroxide (MILK OF MAGNESIA) suspension 30 mL  30 mL Oral Daily PRN Ardis Hughs, NP       methocarbamol (ROBAXIN) tablet 500 mg  500 mg Oral Q8H PRN Lauro Franklin, MD   500 mg at 11/25/23 2132   multivitamins with iron tablet 1 tablet  1 tablet Oral Daily Golda Acre, MD   1 tablet at 11/26/23 0805   naproxen (NAPROSYN) tablet 500 mg  500 mg Oral BID WC Golda Acre, MD   500 mg at 11/26/23 1654   ondansetron (ZOFRAN-ODT) disintegrating tablet 4 mg  4 mg Oral Q6H PRN Lauro Franklin, MD       risperiDONE (RISPERDAL) tablet 2 mg  2 mg Oral QHS Golda Acre, MD       Vitamin D (Ergocalciferol) (DRISDOL) 1.25 MG (50000 UNIT) capsule 50,000 Units  50,000 Units Oral Q7 days Jimmey Ralph,  Rhea Bleacher, MD   50,000 Units at 11/24/23 1146   vitamin D3 (CHOLECALCIFEROL) tablet 2,000 Units  2,000 Units Oral BID Golda Acre, MD   2,000 Units at 11/26/23 1654   PTA Medications: Medications Prior to Admission  Medication Sig Dispense Refill Last Dose/Taking   amLODipine (NORVASC) 5 MG tablet Take 1 tablet (5 mg total) by mouth daily. (Patient not taking: Reported on 11/22/2023) 30 tablet 1 Not Taking   aspirin EC 81 MG tablet Take 1 tablet (81 mg total) by mouth at bedtime. Swallow whole. (Patient not taking: Reported on 11/22/2023)   Not Taking   atorvastatin (LIPITOR) 40 MG tablet Take 1 tablet (40 mg total) by mouth at bedtime. (Patient not taking: Reported on 11/22/2023) 30 tablet 1 Not Taking   cyclobenzaprine (FLEXERIL) 5 MG tablet Take 5 mg by mouth 3 (three) times daily as needed for muscle spasms. (Patient not taking: Reported on 11/22/2023)   Not Taking   meloxicam (MOBIC) 15 MG tablet Take 1 tablet (15 mg total) by mouth daily.  (Patient not taking: Reported on 11/22/2023) 30 tablet 1 Not Taking   traZODone (DESYREL) 100 MG tablet Take 100 mg by mouth at bedtime. (Patient not taking: Reported on 11/22/2023)   Not Taking    Patient Stressors: Financial difficulties   Medication change or noncompliance   Substance abuse    Patient Strengths: Printmaker for treatment/growth  Supportive family/friends   Treatment Modalities: Medication Management, Group therapy, Case management,  1 to 1 session with clinician, Psychoeducation, Recreational therapy.   Physician Treatment Plan for Primary Diagnosis: MDD (major depressive disorder), recurrent severe, without psychosis (HCC) Long Term Goal(s): Improvement in symptoms so as ready for discharge   Short Term Goals: Ability to identify changes in lifestyle to reduce recurrence of condition will improve Ability to verbalize feelings will improve Ability to disclose and discuss suicidal ideas Ability to demonstrate self-control will improve Ability to identify and develop effective coping behaviors will improve Ability to maintain clinical measurements within normal limits will improve Compliance with prescribed medications will improve Ability to identify triggers associated with substance abuse/mental health issues will improve  Medication Management: Evaluate patient's response, side effects, and tolerance of medication regimen.  Therapeutic Interventions: 1 to 1 sessions, Unit Group sessions and Medication administration.  Evaluation of Outcomes: Progressing  Physician Treatment Plan for Secondary Diagnosis: Principal Problem:   MDD (major depressive disorder), recurrent severe, without psychosis (HCC) Active Problems:   Essential hypertension  Long Term Goal(s): Improvement in symptoms so as ready for discharge   Short Term Goals: Ability to identify changes in lifestyle to reduce recurrence of condition will improve Ability to verbalize  feelings will improve Ability to disclose and discuss suicidal ideas Ability to demonstrate self-control will improve Ability to identify and develop effective coping behaviors will improve Ability to maintain clinical measurements within normal limits will improve Compliance with prescribed medications will improve Ability to identify triggers associated with substance abuse/mental health issues will improve     Medication Management: Evaluate patient's response, side effects, and tolerance of medication regimen.  Therapeutic Interventions: 1 to 1 sessions, Unit Group sessions and Medication administration.  Evaluation of Outcomes: Progressing   RN Treatment Plan for Primary Diagnosis: MDD (major depressive disorder), recurrent severe, without psychosis (HCC) Long Term Goal(s): Knowledge of disease and therapeutic regimen to maintain health will improve  Short Term Goals: Ability to remain free from injury will improve, Ability to verbalize frustration and anger appropriately will  improve, Ability to participate in decision making will improve, Ability to verbalize feelings will improve, Ability to identify and develop effective coping behaviors will improve, and Compliance with prescribed medications will improve  Medication Management: RN will administer medications as ordered by provider, will assess and evaluate patient's response and provide education to patient for prescribed medication. RN will report any adverse and/or side effects to prescribing provider.  Therapeutic Interventions: 1 on 1 counseling sessions, Psychoeducation, Medication administration, Evaluate responses to treatment, Monitor vital signs and CBGs as ordered, Perform/monitor CIWA, COWS, AIMS and Fall Risk screenings as ordered, Perform wound care treatments as ordered.  Evaluation of Outcomes: Progressing   LCSW Treatment Plan for Primary Diagnosis: MDD (major depressive disorder), recurrent severe, without  psychosis (HCC) Long Term Goal(s): Safe transition to appropriate next level of care at discharge, Engage patient in therapeutic group addressing interpersonal concerns.  Short Term Goals: Engage patient in aftercare planning with referrals and resources, Increase ability to appropriately verbalize feelings, Facilitate acceptance of mental health diagnosis and concerns, and Identify triggers associated with mental health/substance abuse issues  Therapeutic Interventions: Assess for all discharge needs, 1 to 1 time with Social worker, Explore available resources and support systems, Assess for adequacy in community support network, Educate family and significant other(s) on suicide prevention, Complete Psychosocial Assessment, Interpersonal group therapy.  Progress in Treatment: Attending groups: No. Participating in groups: No. Taking medication as prescribed: Yes. Toleration medication: Yes. Family/Significant other contact made: patient declined to provide consents Patient understands diagnosis: Yes. Discussing patient identified problems/goals with staff: Yes. Medical problems stabilized or resolved: Yes. Denies suicidal/homicidal ideation: Yes. Issues/concerns per patient self-inventory: No.   New problem(s) identified: No, Describe:  None   New Short Term/Long Term Goal(s): detox, medication management for mood stabilization; elimination of SI thoughts; development of comprehensive mental wellness/sobriety plan   Patient Goals:  "Get my mind right"   Discharge Plan or Barriers: Patient recently admitted. CSW will continue to follow and assess for appropriate referrals and possible discharge planning.    Reason for Continuation of Hospitalization: Medication stabilization Withdrawal symptoms Other; describe mood stabilization, discharge planning   Estimated Length of Stay: 3 - 5 days  Last 3 [place] Suicide Severity Risk Score: Flowsheet Row Ad[facility] (Current[facility]2023  [facility] Most recent reading at 11/21/2023  5:40 PM ED from 11/21/2023 in [facility] Most recent reading at 11/21/2023  1:59 PM ED from 07/09/2023 in [facility] Most recent reading at 07/10/2023 12:48 AM  C-SSRS RISK CATEGORY Low Risk Low Risk No Risk       Last PHQ 2/9 Scores:    07/12/2023    3:13 PM 07/09/2023   10:38 AM 08/06/2022    2:06 PM  Depression screen PHQ 2/9  Decreased Interest 1 1 3   Down, Depressed, Hopeless 1 1 1   PHQ - 2 Score 2 2 4   Altered sleeping 2 1 0  Tired, decreased energy 1 1 0  Change in appetite 0 0 1  Feeling bad or failure about yourself  1 1 1   Trouble concentrating 1 1   Moving slowly or fidgety/restless 1 1 2   Suicidal thoughts 0 0 2  PHQ-9 Score 8 7 10   Difficult doing work/chores Very difficult Very difficult Somewhat difficult    Scribe for Treatment Team: Nyra Jabs 11/26/2023 7:45 PM

## 2023-11-26 NOTE — [facility] Group Notes (Signed)
 [facility] Group Notes:  (Nursing/MHT/Case Management/Adjunct)  Date:  11/26/2023  Time:  8:49 PM  Type of Therapy:   Wrap-up group  Participation Level:  Did Not Attend  Participation Quality:    Affect:    Cognitive:    Insight:    Engagement in Group:    Modes of Intervention:    Summary of Progress/Problems: Pt refused to attend group.   Alexandra Buckley 11/26/2023, 8:49 PM

## 2023-11-26 NOTE — Progress Notes (Signed)
   11/26/23 0800  Psych Admission Type (Psych Patients Only)  Admission Status Voluntary  Psychosocial Assessment  Patient Complaints Anxiety;Depression  Eye Contact Brief  Facial Expression Flat  Affect Appropriate to circumstance;Anxious;Sad  Speech Logical/coherent  Interaction Minimal  Motor Activity Slow  Appearance/Hygiene Unremarkable  Behavior Characteristics Cooperative;Calm  Mood Depressed;Anxious  Thought Process  Coherency WDL  Content WDL  Delusions None reported or observed  Perception WDL  Hallucination None reported or observed  Judgment Poor  Confusion None  Danger to Self  Current suicidal ideation? Denies  Self-Injurious Behavior No self-injurious ideation or behavior indicators observed or expressed   Agreement Not to Harm Self Yes  Description of Agreement verbal  Danger to Others  Danger to Others None reported or observed

## 2023-11-27 DIAGNOSIS — F332 Major depressive disorder, recurrent severe without psychotic features: Secondary | ICD-10-CM | POA: Diagnosis not present

## 2023-11-27 MED ORDER — RISPERIDONE 2 MG PO TABS
4.0000 mg | ORAL_TABLET | Freq: Every day | ORAL | Status: DC
  Administered 2023-11-27 – 2023-11-28 (×2): 4 mg via ORAL
  Filled 2023-11-27 (×2): qty 2
  Filled 2023-11-27: qty 28
  Filled 2023-11-27: qty 2

## 2023-11-27 MED ORDER — DULOXETINE HCL 60 MG PO CPEP
60.0000 mg | ORAL_CAPSULE | Freq: Every day | ORAL | Status: DC
  Administered 2023-11-28 – 2023-11-29 (×2): 60 mg via ORAL
  Filled 2023-11-27: qty 1
  Filled 2023-11-27: qty 14
  Filled 2023-11-27 (×2): qty 1

## 2023-11-27 MED ORDER — IBUPROFEN 400 MG PO TABS: 400.0000 mg | ORAL_TABLET | Freq: Four times a day (QID) | ORAL | Status: DC | PRN

## 2023-11-27 MED ORDER — GABAPENTIN 100 MG PO CAPS
200.0000 mg | ORAL_CAPSULE | Freq: Three times a day (TID) | ORAL | Status: DC
  Administered 2023-11-27 – 2023-11-29 (×6): 200 mg via ORAL
  Filled 2023-11-27: qty 2
  Filled 2023-11-27: qty 84
  Filled 2023-11-27 (×4): qty 2
  Filled 2023-11-27: qty 84
  Filled 2023-11-27 (×4): qty 2
  Filled 2023-11-27: qty 84

## 2023-11-27 NOTE — Progress Notes (Signed)
Pt did not attend goals group. 

## 2023-11-27 NOTE — Group Note (Signed)
 Date:  11/27/2023 Time:  3:13 PM  Group Topic/Focus:  Self Esteem:   Neomia Dear can help build self-esteem and confidence through singing, social engagement, and positive reinforcement. It can also help people express themselves and release tension   Participation Level:  Did Not Attend   Shela Nevin 11/27/2023, 3:13 PM

## 2023-11-27 NOTE — Group Note (Signed)
 Date:  11/27/2023 Time:  8:16 PM  Group Topic/Focus:  Wrap-Up Group:   The focus of this group is to help patients review their daily goal of treatment and discuss progress on daily workbooks.    Participation Level:  Did Not Attend  Participation Quality:   Patient did not participate   Affect:  Patient did not participate   Cognitive:  Patient did not participate   Insight: None  Engagement in Group:  Patient did not participate   Modes of Intervention:  Patient did not participate   Additional Comments:  Patient did not participate in the wrap up group this evening   Alfonse Ras 11/27/2023, 8:16 PM

## 2023-11-27 NOTE — Progress Notes (Signed)
 [facility] MD Progress Note  11/27/2023 10:48 AM Alexandra Buckley  MRN:  [id number] Subjective:   Alexandra Buckley is a 53 yr old female who presented on 2/16 to [facility] with SI worsened by her substance abuse, she was admitted to [facility] on 2/17. PPHx is significant for MDD and Cocaine Use Disorder, and 2 Suicide Attempts (last- Ingestion 08/2022) and 2 Prior Psychiatric Hospitalizations, and no history of Self Injurious Behavior.    Case was discussed in the multidisciplinary team. MAR was reviewed and patient was compliant with medications.    The patient was seen in her room during rounds.  She continues to complain of pain and auditory hallucinations.  She reports no improvement in auditory hallucinations with increase in Risperdal.  She does report less sedation during the day with the elimination of the morning dose of Risperdal.  She denies suicidal or homicidal ideation.  She denies visual hallucinations.  She denies medication side effects.  We discussed increasing duloxetine to address issues with depression, anxiety, and pain.  I will also increase gabapentin and Risperdal, and add as needed Advil to her regimen.   Principal Problem: MDD (major depressive disorder), recurrent severe, without psychosis (HCC) Diagnosis: Principal Problem:   MDD (major depressive disorder), recurrent severe, without psychosis (HCC) Active Problems:   Essential hypertension  Total Time spent with patient:  I personally spent 35 minutes on the unit in direct patient care. The direct patient care time included face-to-face time with the patient, reviewing the patient's chart, communicating with other professionals, and coordinating care. Greater than 50% of this time was spent in counseling or coordinating care with the patient regarding goals of hospitalization, psycho-education, and discharge planning needs.   Past Psychiatric History: MDD and Cocaine Use Disorder, and 2 Suicide Attempts (last- Ingestion 08/2022) and 2  Prior Psychiatric Hospitalizations, and no history of Self Injurious Behavior.   Past Medical History:  Past Medical History:  Diagnosis Date   History of suicide attempt 08/05/2022   2018   Homeless    HTN (hypertension)    Multiple cerebral infarctions (HCC)    Polysubstance abuse (HCC)    Tobacco use disorder 08/05/2022   History reviewed. No pertinent surgical history. Family History: History reviewed. No pertinent family history. Family Psychiatric  History:  Brother, Sister- Crack Cocaine Abuse No Known Diagnosis' or Suicides  Social History:  Social History   Substance and Sexual Activity  Alcohol Use Yes     Social History   Substance and Sexual Activity  Drug Use Yes   Types: Cocaine   Comment: Heroin/Crack    Social History   Socioeconomic History   Marital status: Legally Separated    Spouse name: Not on file   Number of children: Not on file   Years of education: Not on file   Highest education level: Not on file  Occupational History   Not on file  Tobacco Use   Smoking status: Former    Types: Cigarettes   Smokeless tobacco: Never  Vaping Use   Vaping status: Never Used  Substance and Sexual Activity   Alcohol use: Yes   Drug use: Yes    Types: Cocaine    Comment: Heroin/Crack   Sexual activity: Not on file  Other Topics Concern   Not on file  Social History Narrative   Not on file   Social Drivers of Health   Financial Resource Strain: Not on file  Food Insecurity: Food Insecurity Present (11/21/2023)   Hunger Vital Sign  Worried About Programme researcher, broadcasting/film/video in the Last Year: Often true    Barista in the Last Year: Often true  Transportation Needs: Unmet Transportation Needs (11/21/2023)   PRAPARE - Administrator, Civil Service (Medical): Yes    Lack of Transportation (Non-Medical): Yes  Physical Activity: Not on file  Stress: Not on file  Social Connections: Unknown (02/17/2022)   Received from [employer],  [facility]   Social Network    Social Network: Not on file   Additional Social History:                         Sleep:  better  due to decreased back pain   Appetite:  Good  Current Medications: Current Facility-Administered Medications  Medication Dose Route Frequency Provider Last Rate Last Admin   acetaminophen (TYLENOL) tablet 650 mg  650 mg Oral Q6H PRN Ardis Hughs, NP   650 mg at 11/26/23 1655   alum & mag hydroxide-simeth (MAALOX/MYLANTA) 200-200-20 MG/5ML suspension 30 mL  30 mL Oral Q4H PRN Ardis Hughs, NP       amLODipine (NORVASC) tablet 10 mg  10 mg Oral Daily Lauro Franklin, MD   10 mg at 11/27/23 0750   aspirin EC tablet 81 mg  81 mg Oral QHS Lauro Franklin, MD   81 mg at 11/26/23 2115   atorvastatin (LIPITOR) tablet 40 mg  40 mg Oral QHS Lauro Franklin, MD   40 mg at 11/26/23 2115   cloNIDine (CATAPRES) tablet 0.1 mg  0.1 mg Oral TID PRN Lauro Franklin, MD   0.1 mg at 11/23/23 4098   dicyclomine (BENTYL) tablet 20 mg  20 mg Oral Q6H PRN Lauro Franklin, MD       haloperidol (HALDOL) tablet 5 mg  5 mg Oral TID PRN Lauro Franklin, MD       And   diphenhydrAMINE (BENADRYL) capsule 50 mg  50 mg Oral TID PRN Lauro Franklin, MD       haloperidol lactate (HALDOL) injection 5 mg  5 mg Intramuscular TID PRN Lauro Franklin, MD       And   diphenhydrAMINE (BENADRYL) injection 50 mg  50 mg Intramuscular TID PRN Lauro Franklin, MD       And   LORazepam (ATIVAN) injection 2 mg  2 mg Intramuscular TID PRN Lauro Franklin, MD       [START ON 11/28/2023] DULoxetine (CYMBALTA) DR capsule 60 mg  60 mg Oral Daily Golda Acre, MD       gabapentin (NEURONTIN) capsule 200 mg  200 mg Oral TID Golda Acre, MD       hydrOXYzine (ATARAX) tablet 25 mg  25 mg Oral TID PRN Ardis Hughs, NP   25 mg at 11/24/23 2109   ibuprofen (ADVIL) tablet 400 mg  400 mg Oral Q6H PRN Golda Acre, MD        loperamide (IMODIUM) capsule 2-4 mg  2-4 mg Oral PRN Lauro Franklin, MD       magnesium hydroxide (MILK OF MAGNESIA) suspension 30 mL  30 mL Oral Daily PRN Ardis Hughs, NP       methocarbamol (ROBAXIN) tablet 500 mg  500 mg Oral Q8H PRN Lauro Franklin, MD   500 mg at 11/25/23 2132   multivitamins with iron tablet 1 tablet  1 tablet Oral Daily Jimmey Ralph,  Rhea Bleacher, MD   1 tablet at 11/27/23 0750   naproxen (NAPROSYN) tablet 500 mg  500 mg Oral BID WC Golda Acre, MD   500 mg at 11/27/23 0750   ondansetron (ZOFRAN-ODT) disintegrating tablet 4 mg  4 mg Oral Q6H PRN Lauro Franklin, MD       risperiDONE (RISPERDAL) tablet 4 mg  4 mg Oral QHS Golda Acre, MD       Vitamin D (Ergocalciferol) (DRISDOL) 1.25 MG (50000 UNIT) capsule 50,000 Units  50,000 Units Oral Q7 days Golda Acre, MD   50,000 Units at 11/24/23 1146   vitamin D3 (CHOLECALCIFEROL) tablet 2,000 Units  2,000 Units Oral BID Golda Acre, MD   2,000 Units at 11/27/23 0750    Lab Results:  Results for orders placed or performed during the hospital encounter of 11/21/23  Folate   Collection Time: 11/24/23  6:33 AM  Result Value Ref Range   Folate 7.2 >5.9 ng/mL  RPR   Collection Time: 11/24/23  6:33 AM  Result Value Ref Range   RPR Ser Ql NON REACTIVE NON REACTIVE  VITAMIN D 25 Hydroxy (Vit-D Deficiency, Fractures)   Collection Time: 11/24/23  6:33 AM  Result Value Ref Range   Vit D, 25-Hydroxy 10.71 (L) 30 - 100 ng/mL  Basic metabolic panel   Collection Time: 11/24/23  6:33 AM  Result Value Ref Range   Sodium 142 135 - 145 mmol/L   Potassium 3.9 3.5 - 5.1 mmol/L   Chloride 110 98 - 111 mmol/L   CO2 26 22 - 32 mmol/L   Glucose, Bld 81 70 - 99 mg/dL   BUN 14 6 - 20 mg/dL   Creatinine, Ser 7.82 0.44 - 1.00 mg/dL   Calcium 9.0 8.9 - 95.6 mg/dL   GFR, Estimated >21 >30 mL/min   Anion gap 6 5 - 15  Sedimentation rate   Collection Time: 11/24/23  6:33 AM  Result Value Ref Range   Sed Rate  11 0 - 22 mm/hr  C-reactive protein   Collection Time: 11/24/23  6:33 AM  Result Value Ref Range   CRP 0.8 <1.0 mg/dL      Blood Alcohol level:  Lab Results  Component Value Date   ETH <10 11/21/2023   ETH <10 07/08/2023    Metabolic Disorder Labs: Lab Results  Component Value Date   HGBA1C 5.8 (H) 11/21/2023   MPG 120 11/21/2023   MPG 119.76 07/11/2023   Lab Results  Component Value Date   PROLACTIN 7.6 08/04/2022   Lab Results  Component Value Date   CHOL 205 (H) 11/21/2023   TRIG 99 11/21/2023   HDL 63 11/21/2023   CHOLHDL 3.3 11/21/2023   VLDL 20 11/21/2023   LDLCALC 122 (H) 11/21/2023   LDLCALC 110 (H) 05/20/2023    Physical Findings: AIMS:  , ,  ,  ,    CIWA:    COWS:  COWS Total Score: 0  Musculoskeletal: Strength & Muscle Tone: within normal limits Gait & Station: normal Patient leans: N/A  Psychiatric Specialty Exam:  Presentation  General Appearance:  Disheveled  Eye Contact: Fair  Speech: Clear and Coherent  Speech Volume: Normal  Handedness: Right   Mood and Affect  Mood: Dysphoric  Affect: Restricted; Depressed   Thought Process  Thought Processes: Coherent  Descriptions of Associations:Intact  Orientation:Full (Time, Place and Person)  Thought Content:Logical  History of Schizophrenia/Schizoaffective disorder:No  Duration of Psychotic Symptoms:No data recorded Hallucinations:Hallucinations: Auditory  Ideas of Reference:None  Suicidal Thoughts:Suicidal Thoughts: No   Homicidal Thoughts:Homicidal Thoughts: No    Sensorium  Memory: Immediate Good  Judgment: Poor  Insight: Fair   Art therapist  Concentration: Fair  Attention Span: Fair  Recall: Good  Fund of Knowledge: Good  Language: Good   Psychomotor Activity  Psychomotor Activity: Psychomotor Activity: Normal    Assets  Assets: Communication Skills; Desire for Improvement   Sleep  Sleep: Sleep:  Good     Physical Exam: General: Sitting comfortably. NAD. HEENT: Normocephalic, atraumatic, MMM, EMOI Lungs: no increased work of breathing noted Heart: no cyanosis Abdomen: Non distended Musculoskeletal: FROM. No obvious deformities Skin: Warm, dry, intact. No rashes noted Neuro: No obvious focal deficits.  Gait and station are normal  Review of Systems  Constitutional: Negative.   HENT: Negative.    Eyes: Negative.   Respiratory: Negative.    Cardiovascular: Negative.   Gastrointestinal: Negative.   Genitourinary: Negative.   Skin: Negative.   Neurological: Negative.   Psychiatric/Behavioral:  Positive for AH, depression.     Blood pressure (!) 147/75, pulse 71, temperature 98.3 F (36.8 C), temperature source Oral, resp. rate 16, height 5\' 2"  (1.575 m), weight 67 kg, last menstrual period 11/18/2018, SpO2 100%. Body mass index is 27 kg/m.   Treatment Plan Summary: Daily contact with patient to assess and evaluate symptoms and progress in treatment and Medication management   Alexandra Buckley is a 53 yr old female who presented on 2/16 to [facility] with SI worsened by her substance abuse, she was admitted to [facility] on 2/17.  PPHx is significant for MDD and Cocaine Use Disorder, and 2 Suicide Attempts (last- Ingestion 08/2022) and 2 Prior Psychiatric Hospitalizations, and no history of Self Injurious Behavior.       MDD, Recurrent, Severe, w/ Psychosis: -Increase Cymbalta to 60 mg daily -Increase gabapentin to 200 mg TID for anxiety and back pain -Increase Risperdal to 4 mg nightly -Continue Agitation Protocol: Haldol/Benadryl/Ativan     Withdrawal: -Continue COWS  Pain: -Continue Robaxin 500 mg q8 PRN muscle spasms -Continue Naproxen 500 mg BID PRN pain -Acetaminophen and ibuprofen as needed  GI: -Continue Bentyl 20 mg q6 PRN spasms -Continue Imodium 2-4 mg PRN diarrhea -Continue Zofran-ODT 4 mg q6 PRN nausea  HTN: -Continue Amlodipine 10 mg daily -Continue  Clonidine 0.1 mg TID PRN Administer one dose if SBP is over 180 -or- if DBP is over 100      -Continue Lipitor 40 mg QHS -Continue Aspirin EC 81 mg daily -Continue PRN's: Tylenol, Maalox, Atarax, Milk of Magnesia   Golda Acre, MD 11/27/2023, 10:48 AM

## 2023-11-27 NOTE — Progress Notes (Signed)
 Pt did attend meditation group.

## 2023-11-27 NOTE — Plan of Care (Signed)
   Problem: Activity: Goal: Sleeping patterns will improve Outcome: Progressing   Problem: Coping: Goal: Ability to verbalize frustrations and anger appropriately will improve Outcome: Progressing Goal: Ability to demonstrate self-control will improve Outcome: Progressing   Problem: Safety: Goal: Periods of time without injury will increase Outcome: Progressing     Problem: Education: Goal: Mental status will improve Outcome: Progressing

## 2023-11-27 NOTE — Group Note (Addendum)
 [facility] LCSW Group Therapy Note   Group Date: 11/27/2023 Start Time: 1000 End Time: 1100   Type of Therapy/Topic:  Group Therapy:  Emotion Regulation  Participation Level:  Patient did not attend.    Laural Roes Jago Carton, LCSWA 11/27/2023  12:43 PM

## 2023-11-27 NOTE — Progress Notes (Signed)
   11/27/23 0840  Psych Admission Type (Psych Patients Only)  Admission Status Voluntary  Psychosocial Assessment  Patient Complaints Anxiety;Depression  Eye Contact Brief  Facial Expression Flat  Affect Appropriate to circumstance  Speech Logical/coherent  Interaction Assertive  Motor Activity Slow  Appearance/Hygiene Unremarkable  Behavior Characteristics Cooperative  Mood Depressed;Anxious  Thought Process  Coherency WDL  Content WDL  Delusions None reported or observed  Perception Hallucinations  Hallucination Auditory (Pt declined to give details about AH, stating, " I don't want to talk about it".)  Judgment Poor  Confusion None  Danger to Self  Agreement Not to Harm Self Yes  Description of Agreement Verbal  Danger to Others  Danger to Others None reported or observed

## 2023-11-28 DIAGNOSIS — F332 Major depressive disorder, recurrent severe without psychotic features: Secondary | ICD-10-CM | POA: Diagnosis not present

## 2023-11-28 MED ORDER — RISPERIDONE 4 MG PO TABS: 4.0000 mg | ORAL_TABLET | Freq: Every day | ORAL | 0 refills | Status: DC

## 2023-11-28 MED ORDER — ASPIRIN 81 MG PO TBEC: 81.0000 mg | DELAYED_RELEASE_TABLET | Freq: Every day | ORAL | 0 refills | Status: DC

## 2023-11-28 MED ORDER — GABAPENTIN 100 MG PO CAPS
200.0000 mg | ORAL_CAPSULE | Freq: Three times a day (TID) | ORAL | 0 refills | Status: DC
Start: 2023-11-28 — End: 2024-02-09

## 2023-11-28 MED ORDER — DULOXETINE HCL 60 MG PO CPEP: 60.0000 mg | ORAL_CAPSULE | Freq: Every day | ORAL | 0 refills | Status: DC

## 2023-11-28 MED ORDER — AMLODIPINE BESYLATE 10 MG PO TABS: 10.0000 mg | ORAL_TABLET | Freq: Every day | ORAL | 0 refills | Status: DC

## 2023-11-28 MED ORDER — ATORVASTATIN CALCIUM 40 MG PO TABS: 40.0000 mg | ORAL_TABLET | Freq: Every day | ORAL | 0 refills | Status: DC

## 2023-11-28 MED ORDER — NAPROXEN 500 MG PO TABS: 500.0000 mg | ORAL_TABLET | Freq: Two times a day (BID) | ORAL | 0 refills | Status: AC

## 2023-11-28 MED ORDER — VITAMIN D (ERGOCALCIFEROL) 1.25 MG (50000 UNIT) PO CAPS: 50000.0000 [IU] | ORAL_CAPSULE | ORAL | 0 refills | Status: DC

## 2023-11-28 MED ORDER — WHITE PETROLATUM EX OINT
TOPICAL_OINTMENT | CUTANEOUS | Status: AC
  Administered 2023-11-28: 1
  Filled 2023-11-28: qty 5

## 2023-11-28 NOTE — BHH Group Notes (Signed)
Pt did not attend goals/orientation group.  

## 2023-11-28 NOTE — Plan of Care (Signed)

## 2023-11-28 NOTE — Progress Notes (Signed)
   11/27/23 2200  Psych Admission Type (Psych Patients Only)  Admission Status Voluntary  Psychosocial Assessment  Patient Complaints Anxiety  Eye Contact Fair  Facial Expression Animated  Affect Appropriate to circumstance  Speech Logical/coherent  Interaction Assertive  Motor Activity Slow  Appearance/Hygiene Unremarkable  Behavior Characteristics Cooperative;Appropriate to situation  Mood Pleasant  Thought Process  Coherency WDL  Content WDL  Delusions None reported or observed  Perception WDL  Hallucination None reported or observed  Judgment Poor  Confusion None  Danger to Self  Current suicidal ideation? Denies  Description of Suicide Plan none  Self-Injurious Behavior No self-injurious ideation or behavior indicators observed or expressed   Agreement Not to Harm Self Yes  Description of Agreement verbal  Danger to Others  Danger to Others None reported or observed

## 2023-11-28 NOTE — Progress Notes (Signed)
  [facility] Adult Case Management Discharge Plan :  Will you be returning to the same living situation after discharge:  No. At discharge, do you have transportation home?: Yes,  Patient will go to [facility] Do you have the ability to pay for your medications: Yes,  Patient has Medicaid  Release of information consent forms completed and in the chart;  Patient's signature needed at discharge.  Patient to Follow up at:  Follow-up Information     [name]. Go to.   Specialty: Behavioral Health Why: Please go to this provider for medication management services. You may go Monday through Friday, arrive no later than 7:00 am after completing [facility]. Contact information: [address] [place] [address] [phone]        [facility]. Go to.   Specialty: Professional Counselor Why: Please go to this provider for therapy services. You may go Monday through Friday, from 9 am to 1 pm for an assessment after completing [facility]. Contact information: [address] [place] [address] [phone]         [facility]. Go on 11/29/2023.   Why: You have been accepted to this treatment facility for substance use. You will begin treatment on 11/29/23 at 9:00AM. You will receive therapy and medication management at this program. Contact information: [address] [place] [address] [phone]                 Next level of care provider has access to [facility] Link:no  Safety Planning and Suicide Prevention discussed: Yes,  Patient refused to provide written consent  for family/significant other to be provided with suicide prevention information. Information provided to patient.     Has patient been referred to the Quitline?: Patient does not use tobacco/nicotine products  Patient has been referred for addiction  treatment: The patient will present to [facility]  on 11/29/23 at [address], [place], [place] 11/28/2023, 3:13 PM

## 2023-11-28 NOTE — BHH Group Notes (Signed)
 Pt did not attend group activity.

## 2023-11-28 NOTE — Plan of Care (Signed)
   Problem: Education: Goal: Emotional status will improve Outcome: Progressing Goal: Mental status will improve Outcome: Progressing Goal: Verbalization of understanding the information provided will improve Outcome: Progressing

## 2023-11-28 NOTE — Group Note (Signed)
 Date:  11/28/2023 Time:  2:45 PM  Group Topic/Focus:  Building Self Esteem:   The Focus of this group is helping patients become aware of the effects of self-esteem on their lives, the things they and others do that enhance or undermine their self-esteem, seeing the relationship between their level of self-esteem and the choices they make and learning ways to enhance self-esteem. Managing Feelings:   The focus of this group is to identify what feelings patients have difficulty handling and develop a plan to handle them in a healthier way upon discharge.    Participation Level:  Did Not Attend  Participation Quality:    Affect:    Cognitive:    Insight:   Engagement in Group:    Modes of Intervention:    Additional Comments:    Gardiner Barefoot 11/28/2023, 2:45 PM

## 2023-11-28 NOTE — BHH Suicide Risk Assessment (Signed)
 [facility] Discharge Suicide Risk Assessment   Principal Problem: MDD (major depressive disorder), recurrent severe, without psychosis (HCC)  Discharge Diagnoses: Principal Problem:   MDD (major depressive disorder), recurrent severe, without psychosis (HCC) Active Problems:   Essential hypertension      Total Time spent with patient: 40  Musculoskeletal: Strength & Muscle Tone: within normal limits Gait & Station: normal Patient leans: N/A   Psychiatric Specialty Exam:  Presentation  General Appearance: Appropriate for Environment  Eye Contact: Good  Speech: Clear and Coherent; Normal Rate  Speech Volume: Normal  Handedness: Right   Mood and Affect  Mood: Euthymic  Affect: Congruent   Thought Process  Thought Processes: Linear  Descriptions of Associations: Intact  Orientation: Full (Time, Place and Person)  Thought Content: Logical  History of Schizophrenia/Schizoaffective disorder: No  Duration of Psychotic Symptoms: NA Hallucinations: Hallucinations: None  Ideas of Reference: None  Suicidal Thoughts: Suicidal Thoughts: No  Homicidal Thoughts: Homicidal Thoughts: No   Sensorium  Memory: Immediate Good  Judgment: Poor  Insight: Fair   Art therapist  Concentration: Good  Attention Span: Good  Recall: Good  Fund of Knowledge: Good  Language: Good   Psychomotor Activity  Psychomotor Activity: Psychomotor Activity: Normal   Assets  Assets: Communication Skills; Leisure Time; Resilience   Sleep  Sleep: Sleep: Good   Physical Exam: General: Sitting comfortably. NAD. HEENT: Normocephalic, atraumatic, MMM, EMOI Lungs: no increased work of breathing noted Heart: no cyanosis Abdomen: Non distended Musculoskeletal: FROM. No obvious deformities Skin: Warm, dry, intact. No rashes noted Neuro: No obvious focal deficits.  Gait and station are normal  Review of Systems  Constitutional: Negative.   HENT: Negative.    Eyes:  Negative.   Respiratory: Negative.    Cardiovascular: Negative.   Gastrointestinal: Negative.   Genitourinary: Negative.   Skin: Negative.   Neurological: Negative.   Psychiatric/Behavioral:  Negative   Mental Status Per Nursing Assessment: WNL  Demographic Factors:  Low socioeconomic status and Unemployed  Loss Factors: Financial problems/change in socioeconomic status  Historical Factors: Prior suicide attempts and Family history of mental illness or substance abuse  Risk Reduction Factors:   Sense of responsibility to family and Positive coping skills or problem solving skills  Continued Clinical Symptoms:  Previous psychiatric diagnoses and treatments  Cognitive Features That Contribute To Risk:  None  Suicide Risk:  Minimal: No identifiable suicidal ideation.  Patients presenting with no risk factors but with morbid ruminations; may be classified as minimal risk based on the severity of the depressive symptoms.    Follow-up Information     [name]. Go to.   Specialty: Behavioral Health Why: Please go to this provider for medication management services. You may go Monday through Friday, arrive no later than 7:00 am after completing [facility]. Contact information: [address] [place] [address] [phone]        [facility]. Go to.   Specialty: Professional Counselor Why: Please go to this provider for therapy services. You may go Monday through Friday, from 9 am to 1 pm for an assessment after completing [facility]. Contact information: [address] [place] [address] [phone]         [facility]. Go on 11/29/2023.   Why: You have been accepted to this treatment facility for substance use. You will begin treatment on 11/29/23 at 9:00AM. You will receive therapy and medication management at this program. Contact information: [address] [place] [address] [phone]                  Plan Of Care/Follow-up recommendations:  Activity: as tolerated  Diet: heart healthy  Other: -Follow-up with your outpatient psychiatric provider -instructions on appointment date, time, and address (location) are provided to you in discharge paperwork.  -Take your psychiatric medications as prescribed at discharge - instructions are provided to you in the discharge paperwork  -Follow-up with outpatient primary care doctor and other specialists -for management of preventative medicine and chronic medical issues  -Testing: Follow-up with outpatient provider for abnormal lab results: See DC Summary  -If you are prescribed an atypical antipsychotic medication, we recommend that your outpatient psychiatrist follow routine screening for side effects within 3 months of discharge, including monitoring: AIMS scale, height, weight, blood pressure, fasting lipid panel, HbA1c, and fasting blood sugar.   -Recommend total abstinence from alcohol, tobacco, and other illicit drug use at discharge.   -If your psychiatric symptoms recur, worsen, or if you have side effects to your psychiatric medications, call your outpatient psychiatric provider, 911, [phone] or go to the nearest emergency department.  -If suicidal thoughts occur, immediately call your outpatient psychiatric provider, 911, [phone] or go to the nearest emergency department.   Criss Alvine, MD 11/28/23 12:07 PM

## 2023-11-28 NOTE — BHH Suicide Risk Assessment (Signed)
 B[facility] INPATIENT:  Family/Significant Other Suicide Prevention Education  Suicide Prevention Education:  Education Completed; with patient Alexandra Buckley.   The suicide prevention education provided includes the following: Suicide risk factors Suicide prevention and interventions National Suicide Hotline telephone number [facility] assessment telephone number [facility] Emergency Assistance 911 [facility] and/or Residential Mobile Crisis Unit telephone number  Patient verbalizes understanding of the suicide prevention education information provided.  The family member/significant other agrees to remove the items of safety concern listed above.  Verna Czech [place] 11/28/2023, 3:58 PM

## 2023-11-28 NOTE — Progress Notes (Signed)
   11/28/23 0844  Psych Admission Type (Psych Patients Only)  Admission Status Voluntary  Psychosocial Assessment  Patient Complaints Anxiety;Depression  Eye Contact Fair  Facial Expression Flat  Affect Appropriate to circumstance  Speech Logical/coherent  Interaction Assertive  Motor Activity Slow  Appearance/Hygiene Unremarkable  Behavior Characteristics Cooperative;Appropriate to situation  Mood Anxious;Pleasant  Thought Process  Coherency WDL  Content WDL  Delusions None reported or observed  Perception WDL  Hallucination None reported or observed  Judgment Poor  Confusion None  Danger to Self  Current suicidal ideation? Denies  Agreement Not to Harm Self Yes  Description of Agreement Verbal  Danger to Others  Danger to Others None reported or observed

## 2023-11-28 NOTE — Group Note (Signed)
 Date:  11/28/2023 Time:  10:18 PM  Group Topic/Focus:  Wrap-Up Group:   The focus of this group is to help patients review their daily goal of treatment and discuss progress on daily workbooks.    Participation Level:  Did Not Attend  Participation Quality:   n/a  Affect:   n/a  Cognitive:   n/a  Insight: None  Engagement in Group:   n/a  Modes of Intervention:   n/a  Additional Comments:  Patient did not attend wrap up group.   Kennieth Francois 11/28/2023, 10:18 PM

## 2023-11-28 NOTE — Progress Notes (Signed)
   11/28/23 2152  Psych Admission Type (Psych Patients Only)  Admission Status Voluntary  Psychosocial Assessment  Patient Complaints Isolation  Eye Contact Fair  Facial Expression Animated  Affect Appropriate to circumstance  Speech Logical/coherent  Interaction Assertive  Motor Activity Slow  Appearance/Hygiene Unremarkable  Behavior Characteristics Cooperative;Appropriate to situation  Mood Pleasant  Thought Process  Coherency WDL  Content WDL  Delusions None reported or observed  Perception WDL  Hallucination None reported or observed  Judgment Poor  Confusion None  Danger to Self  Current suicidal ideation? Denies  Self-Injurious Behavior No self-injurious ideation or behavior indicators observed or expressed   Agreement Not to Harm Self Yes  Description of Agreement verbal  Danger to Others  Danger to Others None reported or observed

## 2023-11-28 NOTE — Progress Notes (Signed)
 [facility] MD Progress Note  11/28/2023 12:06 PM Alexandra Buckley  MRN:  [id number] Subjective:   Alexandra Buckley is a 53 yr old female who presented on 2/16 to [facility] with SI worsened by her substance abuse, she was admitted to [facility] on 2/17. PPHx is significant for MDD and Cocaine Use Disorder, and 2 Suicide Attempts (last- Ingestion 08/2022) and 2 Prior Psychiatric Hospitalizations, and no history of Self Injurious Behavior.    Case was discussed in the multidisciplinary team. MAR was reviewed and patient was compliant with medications.    Alexandra Buckley was seen in her room during rounds.  She reported that her mood was "good" today.  She denies any new psychiatric or medical complaints.  She reports that her appetite is good.  She reports that focus and concentration are adequate.  She denies issues with energy.  She reports adequate sleep.  She denies any medication side effects.  She denies suicidal ideations, homicidal ideations, auditory hallucinations, visual hallucinations, or delusions.  Plan is for discharge to [facility] tomorrow.    Principal Problem: MDD (major depressive disorder), recurrent severe, without psychosis (HCC) Diagnosis: Principal Problem:   MDD (major depressive disorder), recurrent severe, without psychosis (HCC) Active Problems:   Essential hypertension  Total Time spent with patient:  I personally spent 35 minutes on the unit in direct patient care. The direct patient care time included face-to-face time with the patient, reviewing the patient's chart, communicating with other professionals, and coordinating care. Greater than 50% of this time was spent in counseling or coordinating care with the patient regarding goals of hospitalization, psycho-education, and discharge planning needs.   Past Psychiatric History: MDD and Cocaine Use Disorder, and 2 Suicide Attempts (last- Ingestion 08/2022) and 2 Prior Psychiatric Hospitalizations, and no history of Self Injurious Behavior.    Past Medical History:  Past Medical History:  Diagnosis Date   History of suicide attempt 08/05/2022   2018   Homeless    HTN (hypertension)    Multiple cerebral infarctions (HCC)    Polysubstance abuse (HCC)    Tobacco use disorder 08/05/2022   History reviewed. No pertinent surgical history. Family History: History reviewed. No pertinent family history. Family Psychiatric  History:  Brother, Sister- Crack Cocaine Abuse No Known Diagnosis' or Suicides  Social History:  Social History   Substance and Sexual Activity  Alcohol Use Yes     Social History   Substance and Sexual Activity  Drug Use Yes   Types: Cocaine   Comment: Heroin/Crack    Social History   Socioeconomic History   Marital status: Legally Separated    Spouse name: Not on file   Number of children: Not on file   Years of education: Not on file   Highest education level: Not on file  Occupational History   Not on file  Tobacco Use   Smoking status: Former    Types: Cigarettes   Smokeless tobacco: Never  Vaping Use   Vaping status: Never Used  Substance and Sexual Activity   Alcohol use: Yes   Drug use: Yes    Types: Cocaine    Comment: Heroin/Crack   Sexual activity: Not on file  Other Topics Concern   Not on file  Social History Narrative   Not on file   Social Drivers of Health   Financial Resource Strain: Not on file  Food Insecurity: Food Insecurity Present (11/21/2023)   Hunger Vital Sign    Worried About Running Out of Food in the Last Year: Often  true    Ran Out of Food in the Last Year: Often true  Transportation Needs: Unmet Transportation Needs (11/21/2023)   PRAPARE - Administrator, Civil Service (Medical): Yes    Lack of Transportation (Non-Medical): Yes  Physical Activity: Not on file  Stress: Not on file  Social Connections: Unknown (02/17/2022)   [facility]   Social Network    Social Network: Not on file   Additional  Social History:                         Sleep:  better  due to decreased back pain   Appetite:  Good  Current Medications: Current Facility-Administered Medications  Medication Dose Route Frequency Provider Last Rate Last Admin   white petrolatum (VASELINE) gel            acetaminophen (TYLENOL) tablet 650 mg  650 mg Oral Q6H PRN Ardis Hughs, NP   650 mg at 11/26/23 1655   alum & mag hydroxide-simeth (MAALOX/MYLANTA) 200-200-20 MG/5ML suspension 30 mL  30 mL Oral Q4H PRN Ardis Hughs, NP       amLODipine (NORVASC) tablet 10 mg  10 mg Oral Daily Lauro Franklin, MD   10 mg at 11/28/23 1610   aspirin EC tablet 81 mg  81 mg Oral QHS Lauro Franklin, MD   81 mg at 11/27/23 2058   atorvastatin (LIPITOR) tablet 40 mg  40 mg Oral QHS Lauro Franklin, MD   40 mg at 11/27/23 2058   cloNIDine (CATAPRES) tablet 0.1 mg  0.1 mg Oral TID PRN Lauro Franklin, MD   0.1 mg at 11/23/23 9604   haloperidol (HALDOL) tablet 5 mg  5 mg Oral TID PRN Lauro Franklin, MD       And   diphenhydrAMINE (BENADRYL) capsule 50 mg  50 mg Oral TID PRN Lauro Franklin, MD       haloperidol lactate (HALDOL) injection 5 mg  5 mg Intramuscular TID PRN Lauro Franklin, MD       And   diphenhydrAMINE (BENADRYL) injection 50 mg  50 mg Intramuscular TID PRN Lauro Franklin, MD       And   LORazepam (ATIVAN) injection 2 mg  2 mg Intramuscular TID PRN Lauro Franklin, MD       DULoxetine (CYMBALTA) DR capsule 60 mg  60 mg Oral Daily Golda Acre, MD   60 mg at 11/28/23 5409   gabapentin (NEURONTIN) capsule 200 mg  200 mg Oral TID Golda Acre, MD   200 mg at 11/28/23 1147   hydrOXYzine (ATARAX) tablet 25 mg  25 mg Oral TID PRN Ardis Hughs, NP   25 mg at 11/24/23 2109   ibuprofen (ADVIL) tablet 400 mg  400 mg Oral Q6H PRN Golda Acre, MD       magnesium hydroxide (MILK OF MAGNESIA) suspension 30 mL  30 mL Oral Daily PRN Ardis Hughs,  NP       multivitamins with iron tablet 1 tablet  1 tablet Oral Daily Golda Acre, MD   1 tablet at 11/28/23 0844   naproxen (NAPROSYN) tablet 500 mg  500 mg Oral BID WC Golda Acre, MD   500 mg at 11/28/23 0844   risperiDONE (RISPERDAL) tablet 4 mg  4 mg Oral QHS Golda Acre, MD   4 mg at 11/27/23 2058  Vitamin D (Ergocalciferol) (DRISDOL) 1.25 MG (50000 UNIT) capsule 50,000 Units  50,000 Units Oral Q7 days Golda Acre, MD   50,000 Units at 11/24/23 1146   vitamin D3 (CHOLECALCIFEROL) tablet 2,000 Units  2,000 Units Oral BID Golda Acre, MD   2,000 Units at 11/28/23 [phone]    Lab Results:  Results for orders placed or performed during the hospital encounter of 11/21/23  Folate   Collection Time: 11/24/23  6:33 AM  Result Value Ref Range   Folate 7.2 >5.9 ng/mL  RPR   Collection Time: 11/24/23  6:33 AM  Result Value Ref Range   RPR Ser Ql NON REACTIVE NON REACTIVE  VITAMIN D 25 Hydroxy (Vit-D Deficiency, Fractures)   Collection Time: 11/24/23  6:33 AM  Result Value Ref Range   Vit D, 25-Hydroxy 10.71 (L) 30 - 100 ng/mL  Basic metabolic panel   Collection Time: 11/24/23  6:33 AM  Result Value Ref Range   Sodium 142 135 - 145 mmol/L   Potassium 3.9 3.5 - 5.1 mmol/L   Chloride 110 98 - 111 mmol/L   CO2 26 22 - 32 mmol/L   Glucose, Bld 81 70 - 99 mg/dL   BUN 14 6 - 20 mg/dL   Creatinine, Ser 5.36 0.44 - 1.00 mg/dL   Calcium 9.0 8.9 - 64.4 mg/dL   GFR, Estimated >03 >47 mL/min   Anion gap 6 5 - 15  Sedimentation rate   Collection Time: 11/24/23  6:33 AM  Result Value Ref Range   Sed Rate 11 0 - 22 mm/hr  C-reactive protein   Collection Time: 11/24/23  6:33 AM  Result Value Ref Range   CRP 0.8 <1.0 mg/dL      Blood Alcohol level:  Lab Results  Component Value Date   ETH <10 11/21/2023   ETH <10 07/08/2023    Metabolic Disorder Labs: Lab Results  Component Value Date   HGBA1C 5.8 (H) 11/21/2023   MPG 120 11/21/2023   MPG 119.76 07/11/2023   Lab  Results  Component Value Date   PROLACTIN 7.6 08/04/2022   Lab Results  Component Value Date   CHOL 205 (H) 11/21/2023   TRIG 99 11/21/2023   HDL 63 11/21/2023   CHOLHDL 3.3 11/21/2023   VLDL 20 11/21/2023   LDLCALC 122 (H) 11/21/2023   LDLCALC 110 (H) 05/20/2023    Physical Findings: AIMS:  , ,  ,  ,    CIWA:    COWS:  COWS Total Score: 0  Musculoskeletal: Strength & Muscle Tone: within normal limits Gait & Station: normal Patient leans: N/A  Psychiatric Specialty Exam:  Presentation  General Appearance:  Appropriate for Environment  Eye Contact: Good  Speech: Clear and Coherent; Normal Rate  Speech Volume: Normal  Handedness: Right   Mood and Affect  Mood: Euthymic  Affect: Congruent   Thought Process  Thought Processes: Linear  Descriptions of Associations:Intact  Orientation:Full (Time, Place and Person)  Thought Content:Logical  History of Schizophrenia/Schizoaffective disorder:No  Duration of Psychotic Symptoms:No data recorded Hallucinations:Hallucinations: None   Ideas of Reference:None  Suicidal Thoughts:Suicidal Thoughts: No   Homicidal Thoughts:Homicidal Thoughts: No    Sensorium  Memory: Immediate Good  Judgment: Poor  Insight: Fair   Art therapist  Concentration: Good  Attention Span: Good  Recall: Good  Fund of Knowledge: Good  Language: Good   Psychomotor Activity  Psychomotor Activity: Psychomotor Activity: Normal    Assets  Assets: Communication Skills; Leisure Time; Resilience   Sleep  Sleep: Sleep: Good     Physical Exam: General: Sitting comfortably. NAD. HEENT: Normocephalic, atraumatic, MMM, EMOI Lungs: no increased work of breathing noted Heart: no cyanosis Abdomen: Non distended Musculoskeletal: FROM. No obvious deformities Skin: Warm, dry, intact. No rashes noted Neuro: No obvious focal deficits.  Gait and station are normal  Review of Systems   Constitutional: Negative.   HENT: Negative.    Eyes: Negative.   Respiratory: Negative.    Cardiovascular: Negative.   Gastrointestinal: Negative.   Genitourinary: Negative.   Skin: Negative.   Neurological: Negative.   Psychiatric/Behavioral:  Positive for AH, depression.     Blood pressure (!) 140/65, pulse 74, temperature 98.4 F (36.9 C), temperature source Oral, resp. rate 16, height 5\' 2"  (1.575 m), weight 67 kg, last menstrual period 11/18/2018, SpO2 99%. Body mass index is 27 kg/m.   Treatment Plan Summary: Daily contact with patient to assess and evaluate symptoms and progress in treatment and Medication management   Kelani Robart is a 53 yr old female who presented on 2/16 to [facility] with SI worsened by her substance abuse, she was admitted to [facility] on 2/17.  PPHx is significant for MDD and Cocaine Use Disorder, and 2 Suicide Attempts (last- Ingestion 08/2022) and 2 Prior Psychiatric Hospitalizations, and no history of Self Injurious Behavior.       MDD, Recurrent, Severe, w/ Psychosis: -Increase Cymbalta to 60 mg daily -Increase gabapentin to 200 mg TID for anxiety and back pain -Increase Risperdal to 4 mg nightly -Continue Agitation Protocol: Haldol/Benadryl/Ativan     Withdrawal: -Continue COWS  Pain: -Continue Robaxin 500 mg q8 PRN muscle spasms -Continue Naproxen 500 mg BID PRN pain -Acetaminophen and ibuprofen as needed  GI: -Continue Bentyl 20 mg q6 PRN spasms -Continue Imodium 2-4 mg PRN diarrhea -Continue Zofran-ODT 4 mg q6 PRN nausea  HTN: -Continue Amlodipine 10 mg daily -Continue Clonidine 0.1 mg TID PRN Administer one dose if SBP is over 180 -or- if DBP is over 100      -Continue Lipitor 40 mg QHS -Continue Aspirin EC 81 mg daily -Continue PRN's: Tylenol, Maalox, Atarax, Milk of Magnesia   Golda Acre, MD 11/28/2023, 12:06 PM

## 2023-11-29 DIAGNOSIS — F332 Major depressive disorder, recurrent severe without psychotic features: Secondary | ICD-10-CM | POA: Diagnosis not present

## 2023-11-29 NOTE — Progress Notes (Signed)
   11/29/23 0800  Psych Admission Type (Psych Patients Only)  Admission Status Voluntary  Psychosocial Assessment  Patient Complaints None  Eye Contact Fair  Facial Expression Animated  Affect Appropriate to circumstance  Speech Logical/coherent  Interaction Assertive  Motor Activity Slow  Appearance/Hygiene Unremarkable  Behavior Characteristics Anxious  Mood Anxious  Thought Process  Coherency WDL  Content WDL  Delusions None reported or observed  Perception WDL  Hallucination None reported or observed  Judgment Poor  Confusion None  Danger to Self  Current suicidal ideation? Denies  Agreement Not to Harm Self Yes  Description of Agreement verbal  Danger to Others  Danger to Others None reported or observed

## 2023-11-29 NOTE — Progress Notes (Signed)
 Pt discharged home alert and oriented x 4. All discharge instructions reviewed with pt and belongings returned to pt. Pt verbalized understanding of discharge instructions.

## 2023-11-29 NOTE — Progress Notes (Signed)
   11/29/23 0529  15 Minute Checks  Location Bedroom  Visual Appearance Calm  Behavior Sleeping  Sleep (Behavioral Health Patients Only)  Calculate sleep? (Click Yes once per 24 hr at 0600 safety check) Yes  Documented sleep last 24 hours 10.25

## 2023-12-01 LAB — ANTINUCLEAR ANTIBODIES, IFA: ANA Ab, IFA: NEGATIVE

## 2023-12-07 NOTE — Discharge Summary (Signed)
 Physician Discharge Summary Note  Patient:  Alexandra Buckley is a 53 y.o. female  MRN:  [id number]  DOB:  1971/06/17  Patient phone: [phone] (home)  Patient address:   [place] [address]   Total Time spent with patient: 53 Minutes  Date of Admission:  11/21/2023  Date of Discharge: 12/07/23   Reason for Admission:  Per Dr. Renaldo Fiddler, Alexandra Buckley is a 53 yr old female who presented on 2/16 to [facility] with SI worsened by her substance abuse, she was admitted to [facility] on 2/17.  PPHx is significant for MDD and Cocaine Use Disorder, and 2 Suicide Attempts (last- Ingestion 08/2022) and 2 Prior Psychiatric Hospitalizations, and no history of Self Injurious Behavior.   She was very tired throughout the interview falling asleep multiple times and needing to be reawoken.   She reports that she was admitted to the hospital because she felt like giving up on life.  She reports she has been feeling this for several months.  When asked she reports she has been having suicidal thoughts for several months.  She reports that she has plans of walking into the ocean to drown herself because she is afraid of the water.   She reports past psychiatric history significant for depression and cocaine abuse.  She reports a history of 2 suicide attempts both via ingestion.  She reports no history of self-injurious behavior.  She reports 2 prior psychiatric hospitalizations.  She reports a past medical history significant for stroke (05/2023), hypertension, and hyperlipidemia.  She reports a past surgical history significant for multiple surgeries to both of her arms and her right leg.  She reports no history of seizures.  She reports no history of head trauma.  She reports NKDA.     She reports she is currently unhoused.  She reports she is not currently working.  She reports no alcohol use.  She reports smoking 1/3 pack/day of cigarettes.  She reports smoking crack cocaine daily and estimates about a gram per  day.  She reports no access to firearms.  She reports no current legal issues.   When asked she reports that Zoloft was helpful for her in the past.  Discussed to restarting it and she was agreeable with this.  She reports not feeling well due to her blood pressure being elevated.  She reports not being on her blood pressure medicine for a while but does not remember the name of it.  Discussed with her that we would look up what she was on in the chart and restart it and she was agreeable with this.  She reports she still has SI but reports no HI or AVH.  She reports no other concerns present.  Number  Principal Problem: MDD (major depressive disorder), recurrent severe, without psychosis (HCC)  Discharge Diagnoses: Principal Problem:   MDD (major depressive disorder), recurrent severe, without psychosis (HCC) Active Problems:   Essential hypertension     Past Psychiatric (and medical) History: Alexandra Buckley  has a past medical history of History of suicide attempt (08/05/2022), Homeless, HTN (hypertension), Multiple cerebral infarctions (HCC), Polysubstance abuse (HCC), and Tobacco use disorder (08/05/2022). MDD and Cocaine Use Disorder, and 2 Suicide Attempts (last- Ingestion 08/2022) and 2 Prior Psychiatric Hospitalizations, and no history of Self Injurious Behavior.   Past Medical History:  Past Medical History:  Diagnosis Date   History of suicide attempt 08/05/2022   2018   Homeless    HTN (hypertension)    Multiple cerebral infarctions (HCC)  Polysubstance abuse (HCC)    Tobacco use disorder 08/05/2022     History reviewed. No pertinent surgical history.   Family History: History reviewed. No pertinent family history.   Family Psychiatric History:  Brother, Sister- Crack Cocaine Abuse No Known Diagnosis' or Suicides  Social History:  Social History   Substance and Sexual Activity  Alcohol Use Yes     Social History   Substance and Sexual Activity  Drug Use Yes    Types: Cocaine   Comment: Heroin/Crack     Social History   Socioeconomic History   Marital status: Legally Separated    Spouse name: Not on file   Number of children: Not on file   Years of education: Not on file   Highest education level: Not on file  Occupational History   Not on file  Tobacco Use   Smoking status: Former    Types: Cigarettes   Smokeless tobacco: Never  Vaping Use   Vaping status: Never Used  Substance and Sexual Activity   Alcohol use: Yes   Drug use: Yes    Types: Cocaine    Comment: Heroin/Crack   Sexual activity: Not on file  Other Topics Concern   Not on file  Social History Narrative   Not on file   Social Drivers of Health   Financial Resource Strain: Not on file  Food Insecurity: Food Insecurity Present (11/21/2023)   Hunger Vital Sign    Worried About Running Out of Food in the Last Year: Often true    Ran Out of Food in the Last Year: Often true  Transportation Needs: Unmet Transportation Needs (11/21/2023)   PRAPARE - Administrator, Civil Service (Medical): Yes    Lack of Transportation (Non-Medical): Yes  Physical Activity: Not on file  Stress: Not on file  Social Connections: Unknown (02/17/2022)   Received from [facility]   Social Network    Social Network: Not on file     Hospital Course:  During the patient's hospitalization, patient had extensive initial psychiatric evaluation, and follow-up psychiatric evaluations every day.  Psychiatric diagnoses provided upon initial assessment: MDD (major depressive disorder), recurrent severe, without psychosis (HCC) [F33.2].  Diagnosis at discharge is major depressive disorder, recurrent, severe, with psychotic features.  Patient was stabilized on the medications listed in the discharge summary.  She was excepted into [facility] for drug treatment, but on the day of discharge she refused to go to [facility].  Patient's care was discussed during the  interdisciplinary team meeting every day during the hospitalization.  The patient denied having side effects to prescribed psychiatric medication.  Gradually, patient started adjusting to milieu. The patient was evaluated each day by a clinical provider to ascertain response to treatment. Improvement was noted by the patient's report of decreasing symptoms, improved sleep and appetite, affect, medication tolerance, behavior, and participation in unit programming.  Patient was asked each day to complete a self inventory noting mood, mental status, pain, new symptoms, anxiety and concerns.    Symptoms were reported as significantly decreased or resolved completely by discharge.   On day of discharge, the patient reports that their mood is stable. The patient denied having suicidal thoughts for more than 48 hours prior to discharge.  Patient denies having homicidal thoughts.  Patient denies having auditory hallucinations.  Patient denies any visual hallucinations or other symptoms of psychosis. The patient was motivated to continue taking medication with a goal of continued improvement in mental health.  The patient reports their target psychiatric symptoms of depression, suicidal ideations, auditory hallucinations responded well to the psychiatric medications, and the patient reports overall benefit other psychiatric hospitalization. Supportive psychotherapy was provided to the patient. The patient also participated in regular group therapy while hospitalized. Coping skills, problem solving as well as relaxation therapies were also part of the unit programming.  Labs were reviewed with the patient, and abnormal results were discussed with the patient.  The patient is able to verbalize their individual safety plan to this provider.    Physical Findings:  AIMS:  Facial and Oral Movements: None Muscles of Facial Expression: None Lips and Perioral Area: None Jaw: None Tongue: None,Extremity  Movements Upper (arms, wrists, hands, fingers): None Lower (legs, knees, ankles, toes): None, Trunk Movements Neck, shoulders, hips: None, Global Judgements Severity of abnormal movements overall: None Incapacitation due to abnormal movements: None Patient's awareness of abnormal movements: No Awareness, Dental Status Current problems with teeth and/or dentures: No Does patient usually wear dentures: No Edentia: No   CIWA:   NA  COWS:  NA  Musculoskeletal: Strength & Muscle Tone: within normal limits Gait & Station: normal Patient leans: N/A    Psychiatric Specialty Exam:  Presentation  General Appearance: Appropriate for Environment  Eye Contact: Good  Speech: Clear and Coherent; Normal Rate  Speech Volume: Normal  Handedness: Right   Mood and Affect  Mood: Euthymic  Affect: Congruent   Thought Process  Thought Processes: Linear  Descriptions of Associations: Intact  Orientation: Full (Time, Place and Person)  Thought Content: Logical  History of Schizophrenia/Schizoaffective disorder: No  Duration of Psychotic Symptoms: NA Hallucinations: No data recorded Ideas of Reference: None  Suicidal Thoughts: No data recorded Homicidal Thoughts: No data recorded  Sensorium  Memory: Immediate Good  Judgment: Poor  Insight: Fair   Art therapist  Concentration: Good  Attention Span: Good  Recall: Good  Fund of Knowledge: Good  Language: Good   Psychomotor Activity  Psychomotor Activity: No data recorded  Assets  Assets: Communication Skills; Leisure Time; Resilience   Sleep  Sleep: No data recorded     Physical Exam: General: Sitting comfortably. NAD. HEENT: Normocephalic, atraumatic, MMM, EMOI Lungs: no increased work of breathing noted Heart: no cyanosis Abdomen: Non distended Musculoskeletal: FROM. No obvious deformities Skin: Warm, dry, intact. No rashes noted Neuro: No obvious focal deficits.  Gait and station are  normal  Review of Systems:  Constitutional: Negative.   HENT: Negative.    Eyes: Negative.   Respiratory: Negative.    Cardiovascular: Negative.   Gastrointestinal: Negative.   Genitourinary: Negative.   Skin: Negative.   Neurological: Negative.   Psychiatric/Behavioral:  Negative  Blood pressure (!) 147/73, pulse 70, temperature 97.9 F (36.6 C), temperature source Oral, resp. rate 16, height 5\' 2"  (1.575 m), weight 67 kg, last menstrual period 11/18/2018, SpO2 100%. Body mass index is 27 kg/m.    Social History   Tobacco Use  Smoking Status Former   Types: Cigarettes  Smokeless Tobacco Never     Tobacco Cessation:  A prescription for an FDA approved medication for tobacco cessation was not prescribed because: Patient refused   Blood Alcohol level:  Lab Results  Component Value Date   ETH <10 11/21/2023   ETH <10 07/08/2023    Metabolic Disorder Labs:  Lab Results  Component Value Date   HGBA1C 5.8 (H) 11/21/2023   MPG 120 11/21/2023   MPG 119.76 07/11/2023   Lab Results  Component Value Date  PROLACTIN 7.6 08/04/2022    Lab Results  Component Value Date   CHOL 205 (H) 11/21/2023   TRIG 99 11/21/2023   HDL 63 11/21/2023   VLDL 20 11/21/2023   LDLCALC 122 (H) 11/21/2023   LDLCALC 110 (H) 05/20/2023      See Psychiatric Specialty Exam and Suicide Risk Assessment completed by Attending Physician prior to discharge.  Discharge destination: To homeless shelter  Is patient on multiple antipsychotic therapies at discharge:  No  Has Patient had three or more failed trials of antipsychotic monotherapy by history: NA Recommended Plan for Multiple Antipsychotic Therapies: NA   Discharge Instructions     Diet - low sodium heart healthy   Complete by: As directed    Increase activity slowly   Complete by: As directed         Allergies as of 11/29/2023   No Known Allergies      Medication List     STOP taking these medications     cyclobenzaprine 5 MG tablet Commonly known as: FLEXERIL   meloxicam 15 MG tablet Commonly known as: MOBIC   traZODone 100 MG tablet Commonly known as: DESYREL       TAKE these medications      Indication  amLODipine 10 MG tablet Commonly known as: NORVASC Take 1 tablet (10 mg total) by mouth daily. What changed:  medication strength how much to take  Indication: High Blood Pressure   aspirin EC 81 MG tablet Take 1 tablet (81 mg total) by mouth at bedtime. Swallow whole.  Indication: Stroke Due To Limited Blood Flow   atorvastatin 40 MG tablet Commonly known as: LIPITOR Take 1 tablet (40 mg total) by mouth at bedtime.  Indication: Cerebrovascular Accident or Stroke, High Amount of Fats in the Blood   DULoxetine 60 MG capsule Commonly known as: CYMBALTA Take 1 capsule (60 mg total) by mouth daily.  Indication: Major Depressive Disorder   gabapentin 100 MG capsule Commonly known as: NEURONTIN Take 2 capsules (200 mg total) by mouth 3 (three) times daily.  Indication: Generalized Anxiety Disorder   risperidone 4 MG tablet Commonly known as: RISPERDAL Take 1 tablet (4 mg total) by mouth at bedtime.  Indication: Major Depressive Disorder   Vitamin D (Ergocalciferol) 1.25 MG (50000 UNIT) Caps capsule Commonly known as: DRISDOL Take 1 capsule (50,000 Units total) by mouth every 7 (seven) days.  Indication: Vitamin D Deficiency       ASK your doctor about these medications      Indication  naproxen 500 MG tablet Commonly known as: NAPROSYN Take 1 tablet (500 mg total) by mouth 2 (two) times daily with a meal for 7 days. Ask about: Should I take this medication?  Indication: Pain          Follow-up Information     [name]. Go to.   Specialty: Behavioral Health Why: Please go to this provider for medication management services on 12/02/23 at 7:00AM. Otherwise, you may go Monday through Friday, arrive no later than 7:00  am. Contact information: [address] [place] [address] [phone]        [facility]. Go to.   Specialty: Professional Counselor Why: Please go to this provider for therapy services on 12/01/23 at 9:00AM. Otherwise, you may go Monday through Friday, from 9 am to 1 pm for an assessment. Contact information: [address] [place] [address] [phone]  Follow-up recommendations:  - It is recommended to the patient to continue psychiatric medications as prescribed, after discharge from the hospital.   - It is recommended to the patient to follow up with your outpatient psychiatric provider and PCP. - It was discussed with the patient, the impact of alcohol, drugs, tobacco have been there overall psychiatric and medical wellbeing, and total abstinence from substance use was recommended the patient. - Prescriptions provided or sent directly to preferred pharmacy at discharge. Patient agreeable to plan. Given opportunity to ask questions. Appears to feel comfortable with discharge.   - In the event of worsening symptoms, the patient is instructed to call the crisis hotline, 911 and or go to the nearest ED for appropriate evaluation and treatment of symptoms. To follow-up with primary care provider for other medical issues, concerns and or health care needs - Patient was discharged to homeless shelter with a plan to follow up as noted above.   Comments:  NA  Signed: Criss Alvine, MD 12/07/23 11:47 AM

## 2023-12-09 NOTE — Progress Notes (Signed)
 Withdrawal sx refers to cocaine

## 2024-02-09 ENCOUNTER — Ambulatory Visit (HOSPITAL_COMMUNITY)
Admission: EM | Admit: 2024-02-09 | Discharge: 2024-02-09 | Payer: MEDICAID | Attending: Psychiatry | Admitting: Psychiatry

## 2024-02-09 ENCOUNTER — Other Ambulatory Visit (HOSPITAL_COMMUNITY)
Admission: EM | Admit: 2024-02-09 | Discharge: 2024-02-18 | Payer: MEDICAID | Attending: Psychiatry | Admitting: Psychiatry

## 2024-02-09 DIAGNOSIS — E785 Hyperlipidemia, unspecified: Secondary | ICD-10-CM | POA: Insufficient documentation

## 2024-02-09 DIAGNOSIS — Z79899 Other long term (current) drug therapy: Secondary | ICD-10-CM | POA: Insufficient documentation

## 2024-02-09 DIAGNOSIS — F1510 Other stimulant abuse, uncomplicated: Secondary | ICD-10-CM | POA: Diagnosis not present

## 2024-02-09 DIAGNOSIS — F1410 Cocaine abuse, uncomplicated: Secondary | ICD-10-CM | POA: Diagnosis not present

## 2024-02-09 DIAGNOSIS — R45851 Suicidal ideations: Secondary | ICD-10-CM

## 2024-02-09 DIAGNOSIS — I10 Essential (primary) hypertension: Secondary | ICD-10-CM | POA: Insufficient documentation

## 2024-02-09 DIAGNOSIS — F1590 Other stimulant use, unspecified, uncomplicated: Secondary | ICD-10-CM

## 2024-02-09 DIAGNOSIS — F1994 Other psychoactive substance use, unspecified with psychoactive substance-induced mood disorder: Secondary | ICD-10-CM

## 2024-02-09 DIAGNOSIS — F1414 Cocaine abuse with cocaine-induced mood disorder: Secondary | ICD-10-CM | POA: Diagnosis not present

## 2024-02-09 DIAGNOSIS — F329 Major depressive disorder, single episode, unspecified: Secondary | ICD-10-CM | POA: Insufficient documentation

## 2024-02-09 DIAGNOSIS — Z8673 Personal history of transient ischemic attack (TIA), and cerebral infarction without residual deficits: Secondary | ICD-10-CM | POA: Insufficient documentation

## 2024-02-09 DIAGNOSIS — Z5900 Homelessness unspecified: Secondary | ICD-10-CM

## 2024-02-09 LAB — POC URINE PREG, ED
Preg Test, Ur: NEGATIVE
Preg Test, Ur: NEGATIVE

## 2024-02-09 LAB — POCT URINE DRUG SCREEN - MANUAL ENTRY (I-SCREEN)
POC Amphetamine UR: NOT DETECTED
POC Buprenorphine (BUP): NOT DETECTED
POC Cocaine UR: POSITIVE — AB
POC Marijuana UR: POSITIVE — AB
POC Methadone UR: NOT DETECTED
POC Methamphetamine UR: NOT DETECTED
POC Morphine: NOT DETECTED
POC Oxazepam (BZO): NOT DETECTED
POC Oxycodone UR: NOT DETECTED
POC Secobarbital (BAR): NOT DETECTED

## 2024-02-09 MED ORDER — CLONIDINE HCL 0.1 MG PO TABS
0.1000 mg | ORAL_TABLET | Freq: Four times a day (QID) | ORAL | Status: DC | PRN
  Administered 2024-02-10 – 2024-02-17 (×3): 0.1 mg via ORAL
  Filled 2024-02-09 (×4): qty 1

## 2024-02-09 MED ORDER — OLANZAPINE 10 MG IM SOLR: 5.0000 mg | Freq: Three times a day (TID) | INTRAMUSCULAR | Status: DC | PRN

## 2024-02-09 MED ORDER — ALUM & MAG HYDROXIDE-SIMETH 200-200-20 MG/5ML PO SUSP
30.0000 mL | ORAL | Status: DC | PRN
  Administered 2024-02-10 – 2024-02-16 (×2): 30 mL via ORAL
  Filled 2024-02-09 (×2): qty 30

## 2024-02-09 MED ORDER — AMLODIPINE BESYLATE 5 MG PO TABS
5.0000 mg | ORAL_TABLET | Freq: Every day | ORAL | Status: DC
  Administered 2024-02-10 – 2024-02-17 (×8): 5 mg via ORAL
  Filled 2024-02-09: qty 7
  Filled 2024-02-09 (×8): qty 1

## 2024-02-09 MED ORDER — HALOPERIDOL LACTATE 5 MG/ML IJ SOLN: 5.0000 mg | Freq: Three times a day (TID) | INTRAMUSCULAR | Status: DC | PRN

## 2024-02-09 MED ORDER — AMLODIPINE BESYLATE 5 MG PO TABS
5.0000 mg | ORAL_TABLET | Freq: Every day | ORAL | Status: DC
  Administered 2024-02-09: 5 mg via ORAL
  Filled 2024-02-09: qty 1

## 2024-02-09 MED ORDER — TRIPLE ANTIBIOTIC 3.5-400-5000 EX OINT
1.0000 | TOPICAL_OINTMENT | Freq: Every day | CUTANEOUS | Status: AC
  Administered 2024-02-09 – 2024-02-11 (×3): 1 via TOPICAL
  Filled 2024-02-09 (×3): qty 1

## 2024-02-09 MED ORDER — HALOPERIDOL LACTATE 5 MG/ML IJ SOLN: 10.0000 mg | Freq: Three times a day (TID) | INTRAMUSCULAR | Status: DC | PRN

## 2024-02-09 MED ORDER — ACETAMINOPHEN 325 MG PO TABS
650.0000 mg | ORAL_TABLET | Freq: Four times a day (QID) | ORAL | Status: DC | PRN
  Administered 2024-02-10 – 2024-02-17 (×7): 650 mg via ORAL
  Filled 2024-02-09 (×7): qty 2

## 2024-02-09 MED ORDER — CLONIDINE HCL 0.1 MG PO TABS
0.1000 mg | ORAL_TABLET | Freq: Once | ORAL | Status: AC
  Administered 2024-02-09: 0.1 mg via ORAL
  Filled 2024-02-09: qty 1

## 2024-02-09 MED ORDER — OLANZAPINE 5 MG PO TBDP: 5.0000 mg | ORAL_TABLET | Freq: Three times a day (TID) | ORAL | Status: DC | PRN

## 2024-02-09 MED ORDER — DIPHENHYDRAMINE HCL 50 MG/ML IJ SOLN: 50.0000 mg | Freq: Three times a day (TID) | INTRAMUSCULAR | Status: DC | PRN

## 2024-02-09 MED ORDER — METHOCARBAMOL 500 MG PO TABS: 500.0000 mg | ORAL_TABLET | Freq: Four times a day (QID) | ORAL | Status: DC | PRN

## 2024-02-09 MED ORDER — ALUM & MAG HYDROXIDE-SIMETH 200-200-20 MG/5ML PO SUSP: 30.0000 mL | ORAL | Status: DC | PRN

## 2024-02-09 MED ORDER — DIPHENHYDRAMINE HCL 50 MG PO CAPS: 50.0000 mg | ORAL_CAPSULE | Freq: Three times a day (TID) | ORAL | Status: DC | PRN

## 2024-02-09 MED ORDER — MAGNESIUM HYDROXIDE 400 MG/5ML PO SUSP: 30.0000 mL | Freq: Every day | ORAL | Status: DC | PRN

## 2024-02-09 MED ORDER — OLANZAPINE 10 MG IM SOLR: 10.0000 mg | Freq: Three times a day (TID) | INTRAMUSCULAR | Status: DC | PRN

## 2024-02-09 MED ORDER — LORAZEPAM 2 MG/ML IJ SOLN: 2.0000 mg | Freq: Three times a day (TID) | INTRAMUSCULAR | Status: DC | PRN

## 2024-02-09 MED ORDER — HALOPERIDOL 5 MG PO TABS: 5.0000 mg | ORAL_TABLET | Freq: Three times a day (TID) | ORAL | Status: DC | PRN

## 2024-02-09 MED ORDER — TRAZODONE HCL 50 MG PO TABS
50.0000 mg | ORAL_TABLET | Freq: Every evening | ORAL | Status: DC | PRN
  Administered 2024-02-10 – 2024-02-17 (×4): 50 mg via ORAL
  Filled 2024-02-09 (×2): qty 1
  Filled 2024-02-09: qty 7
  Filled 2024-02-09 (×4): qty 1

## 2024-02-09 MED ORDER — HYDROXYZINE HCL 25 MG PO TABS
25.0000 mg | ORAL_TABLET | Freq: Three times a day (TID) | ORAL | Status: DC | PRN
  Administered 2024-02-10 – 2024-02-17 (×2): 25 mg via ORAL
  Filled 2024-02-09 (×3): qty 1

## 2024-02-09 MED ORDER — ACETAMINOPHEN 325 MG PO TABS: 650.0000 mg | ORAL_TABLET | Freq: Four times a day (QID) | ORAL | Status: DC | PRN

## 2024-02-09 MED ORDER — MAGNESIUM HYDROXIDE 400 MG/5ML PO SUSP
30.0000 mL | Freq: Every day | ORAL | Status: DC | PRN
  Administered 2024-02-13: 30 mL via ORAL
  Filled 2024-02-09: qty 30

## 2024-02-09 MED ORDER — AMLODIPINE BESYLATE 5 MG PO TABS: 5.0000 mg | ORAL_TABLET | Freq: Every day | ORAL | Status: DC

## 2024-02-09 MED ORDER — AMLODIPINE BESYLATE 5 MG PO TABS
5.0000 mg | ORAL_TABLET | Freq: Once | ORAL | Status: AC
  Administered 2024-02-09: 5 mg via ORAL
  Filled 2024-02-09: qty 1

## 2024-02-09 NOTE — ED Notes (Signed)
 Patient A&Ox4. Patient endorses SI no plan or intent. Patient denies HI and AVH. Patient oriented to unit. Meal and beverage given. Patient denies any physical complaints when asked. No acute distress noted. Support and encouragement provided. Routine safety checks conducted according to facility protocol. Encouraged patient to notify staff if thoughts of harm toward self or others arise. Patient verbalize understanding and agreement. Will continue to monitor for safety.

## 2024-02-09 NOTE — ED Provider Notes (Signed)
 [facility] Continuous Assessment Admission H&P  Date: 02/09/24 Patient Name: Alexandra Buckley MRN: [id number] Chief Complaint: suicidal thoughts   Diagnoses:  Final diagnoses:  Suicidal ideation  Cocaine abuse (HCC)  Homelessness unspecified    HPI: Alexandra Buckley, 53 y/o female with a history of MDD, Cocaine abuse, polysubstance abuse, SI, presented to [facility] voluntarily.  Per the patient when asked if she is suicidal she stated there is no reason for her to be living.  I review of patient records show a history of cocaine abuse and homelessness.  Patient is currently not seeing a psychiatrist or therapist.  According to patient she is currently homeless and has nowhere to go.  Per the patient she just cannot get right with life.  Reports using crack cocaine within the last 24 hours.  Face-to-face observation of patient, patient is alert and oriented x 4, speech is clear.  Upon entering the room patient is observed slumped over in the chair patient was asked to sit up so she could be interviewed.  Patient does have a defensive attitude.  Patient does appear to be malingering for secondary gain as she has nowhere to go.  When asked if she is suicidal she did not alter right admitted but she is stated she questioned if her life is worth living.  Patient denies HI, AVH or paranoia.  Denies recent alcohol use.  According to patient she last used crack cocaine within the last 24 hours.  Patient stated she need a place to stay so she can figure out a plan.  When asked if she is homeless she says something like that.  At this present moment patient does not seem to be in any acute distress does not seem to be influenced by internal stimuli however patient does appear to be malingering for secondary gain for a place to stay.  However because patient presented with suicidal ideation.  We will admit patient for overnight observation and further evaluation in the morning with possible discharge.  Total  Time spent with patient: 20 minutes  Musculoskeletal  Strength & Muscle Tone: within normal limits Gait & Station: normal Patient leans: N/A  Psychiatric Specialty Exam  Presentation General Appearance:  Appropriate for Environment  Eye Contact: Fleeting  Speech: Clear and Coherent  Speech Volume: Decreased  Handedness: Right   Mood and Affect  Mood: Euthymic  Affect: Congruent   Thought Process  Thought Processes: Linear  Descriptions of Associations:Intact  Orientation:Full (Time, Place and Person)  Thought Content:WDL  Diagnosis of Schizophrenia or Schizoaffective disorder in past: No   Hallucinations:Hallucinations: None  Ideas of Reference:None  Suicidal Thoughts:Suicidal Thoughts: Yes, Passive SI Passive Intent and/or Plan: Without Plan; Without Intent  Homicidal Thoughts:Homicidal Thoughts: No   Sensorium  Memory: Immediate Fair  Judgment: Poor  Insight: Fair   Chartered certified accountant: Fair  Attention Span: Fair  Recall: Good  Fund of Knowledge: Good  Language: Good   Psychomotor Activity  Psychomotor Activity: Psychomotor Activity: Normal   Assets  Assets: Desire for Improvement; Housing; Vocational/Educational   Sleep  Sleep: Sleep: Fair Number of Hours of Sleep: 8   Nutritional Assessment (For OBS and FBC admissions only) Has the patient had a weight loss or gain of 10 pounds or more in the last 3 months?: No Has the patient had a decrease in food intake/or appetite?: No Does the patient have dental problems?: No Does the patient have eating habits or behaviors that may be indicators of an eating disorder including  binging or inducing vomiting?: No Has the patient recently lost weight without trying?: 0 Has the patient been eating poorly because of a decreased appetite?: 0 Malnutrition Screening Tool Score: 0    Physical Exam HENT:     Head: Normocephalic.     Nose: Nose normal.   Eyes:     Pupils: Pupils are equal, round, and reactive to light.  Cardiovascular:     Rate and Rhythm: Normal rate.  Pulmonary:     Effort: Pulmonary effort is normal.  Musculoskeletal:        General: Normal range of motion.     Cervical back: Normal range of motion.  Neurological:     General: No focal deficit present.     Mental Status: She is alert.  Psychiatric:        Mood and Affect: Mood normal.        Behavior: Behavior normal.        Thought Content: Thought content normal.        Judgment: Judgment normal.    Review of Systems  Constitutional: Negative.   HENT: Negative.    Eyes: Negative.   Respiratory: Negative.    Cardiovascular: Negative.   Gastrointestinal: Negative.   Genitourinary: Negative.   Musculoskeletal: Negative.   Skin: Negative.   Neurological: Negative.   Psychiatric/Behavioral:  Positive for substance abuse and suicidal ideas. The patient is nervous/anxious.     Blood pressure (!) 161/78, pulse 71, temperature 98.3 F (36.8 C), temperature source Oral, resp. rate 18, last menstrual period 11/18/2018, SpO2 99%. There is no height or weight on file to calculate BMI.  Past Psychiatric History: Suicidal ideation, polysubstance abuse, cocaine abuse, MDD.  Homelessness  Is the patient at risk to self? Yes  Has the patient been a risk to self in the past 6 months? Yes .    Has the patient been a risk to self within the distant past? Yes   Is the patient a risk to others? No   Has the patient been a risk to others in the past 6 months? No   Has the patient been a risk to others within the distant past? No   Past Medical History: See chart  Family History: Unknown  Social History: Cocaine abuse  Last Labs:  Admission on 11/21/2023, Discharged on 11/29/2023  Component Date Value Ref Range Status   Folate 11/24/2023 7.2  >5.9 ng/mL Final   Performed at [facility], [address], [place], [place] [address]   RPR Ser Ql  11/24/2023 NON REACTIVE  NON REACTIVE Final   Performed at [facility], [address] [place], [place] [address]   Vit D, 25-Hydroxy 11/24/2023 10.71 (L)  30 - 100 ng/mL Final   Comment: (NOTE) Vitamin D  deficiency has been defined by the Institute of Medicine  and an Endocrine Society practice guideline as a level of serum 25-OH  vitamin D  less than 20 ng/mL (1,2). The Endocrine Society went on to  further define vitamin D  insufficiency as a level between 21 and 29  ng/mL (2).  1. IOM (Institute of Medicine). 2010. Dietary reference intakes for  calcium  and D. [place]: [employer]. 2. Holick MF, Binkley Nesbitt, Bischoff-Ferrari HA, et al. Evaluation,  treatment, and prevention of vitamin D  deficiency: an Endocrine  Society clinical practice guideline, JCEM. 2011 Jul; 96(7): 1911-30.  Performed at [facility], [address] [place], [place] [address]    Sodium 11/24/2023  142  135 - 145 mmol/L Final   Potassium 11/24/2023 3.9  3.5 - 5.1 mmol/L Final   Chloride 11/24/2023 110  98 - 111 mmol/L Final   CO2 11/24/2023 26  22 - 32 mmol/L Final   Glucose, Bld 11/24/2023 81  70 - 99 mg/dL Final   Glucose reference range applies only to samples taken after fasting for at least 8 hours.   BUN 11/24/2023 14  6 - 20 mg/dL Final   Creatinine, Ser 11/24/2023 0.62  0.44 - 1.00 mg/dL Final   Calcium  11/24/2023 9.0  8.9 - 10.3 mg/dL Final   GFR, Estimated 11/24/2023 >60  >60 mL/min Final   Comment: (NOTE) Calculated using the CKD-EPI Creatinine Equation (2021)    Anion gap 11/24/2023 6  5 - 15 Final   Performed at [facility], [address], [place], [place] [address]   Sed Rate 11/24/2023 11  0 - 22 mm/hr Final   Performed at [facility], [address] [place], [place] [address]   CRP 11/24/2023 0.8  <1.0 mg/dL Final   Performed at [facility], [address] [place], [place] [address]   ANA Ab, IFA  11/24/2023 Negative   Final   Comment: (NOTE)                                     Negative   <1:80                                     Borderline  1:80                                     Positive   >1:80 ICAP nomenclature: AC-0 For more information about Hep-2 cell patterns use ANApatterns.org, the official website for the International Consensus on Antinuclear Antibody (ANA) Patterns (ICAP). Performed At: [facility] [address] [place], [place] [id number] Pearlean Botts MD NU:[id number]   Admission on 11/21/2023, Discharged on 11/21/2023  Component Date Value Ref Range Status   WBC 11/21/2023 6.5  4.0 - 10.5 K/uL Final   RBC 11/21/2023 4.91  3.87 - 5.11 MIL/uL Final   Hemoglobin 11/21/2023 14.0  12.0 - 15.0 g/dL Final   HCT 34/74/2595 44.1  36.0 - 46.0 % Final   MCV 11/21/2023 89.8  80.0 - 100.0 fL Final   MCH 11/21/2023 28.5  26.0 - 34.0 pg Final   MCHC 11/21/2023 31.7  30.0 - 36.0 g/dL Final   RDW 63/87/5643 14.0  11.5 - 15.5 % Final   Platelets 11/21/2023 259  150 - 400 K/uL Final   nRBC 11/21/2023 0.0  0.0 - 0.2 % Final   Neutrophils Relative % 11/21/2023 61  % Final   Neutro Abs 11/21/2023 4.0  1.7 - 7.7 K/uL Final   Lymphocytes Relative 11/21/2023 31  % Final   Lymphs Abs 11/21/2023 2.0  0.7 - 4.0 K/uL Final   Monocytes Relative 11/21/2023 6  % Final   Monocytes Absolute 11/21/2023 0.4  0.1 - 1.0 K/uL Final   Eosinophils Relative 11/21/2023 1  % Final   Eosinophils Absolute 11/21/2023 0.1  0.0 - 0.5 K/uL Final   Basophils Relative 11/21/2023 1  % Final   Basophils Absolute 11/21/2023  0.0  0.0 - 0.1 K/uL Final   Immature Granulocytes 11/21/2023 0  % Final   Abs Immature Granulocytes 11/21/2023 0.02  0.00 - 0.07 K/uL Final   Performed at [facility], [address] [place], [place] [address]   Sodium 11/21/2023 146 (H)  135 - 145 mmol/L Final   Potassium 11/21/2023 3.7  3.5 - 5.1 mmol/L Final   Chloride 11/21/2023 104  98 - 111 mmol/L Final   CO2  11/21/2023 29  22 - 32 mmol/L Final   Glucose, Bld 11/21/2023 96  70 - 99 mg/dL Final   Glucose reference range applies only to samples taken after fasting for at least 8 hours.   BUN 11/21/2023 13  6 - 20 mg/dL Final   Creatinine, Ser 11/21/2023 0.80  0.44 - 1.00 mg/dL Final   Calcium  11/21/2023 9.3  8.9 - 10.3 mg/dL Final   Total Protein 56/21/3086 6.6  6.5 - 8.1 g/dL Final   Albumin 57/84/6962 3.9  3.5 - 5.0 g/dL Final   AST 95/28/4132 17  15 - 41 U/L Final   ALT 11/21/2023 19  0 - 44 U/L Final   Alkaline Phosphatase 11/21/2023 80  38 - 126 U/L Final   Total Bilirubin 11/21/2023 0.4  0.0 - 1.2 mg/dL Final   GFR, Estimated 11/21/2023 >60  >60 mL/min Final   Comment: (NOTE) Calculated using the CKD-EPI Creatinine Equation (2021)    Anion gap 11/21/2023 13  5 - 15 Final   Performed at [facility], [address], [place], [place] [address]   Hgb A1c MFr Bld 11/21/2023 5.8 (H)  4.8 - 5.6 % Final   Comment: (NOTE)         Prediabetes: 5.7 - 6.4         Diabetes: >6.4         Glycemic control for adults with diabetes: <7.0    Mean Plasma Glucose 11/21/2023 120  mg/dL Final   Comment: (NOTE) Performed At: [facility] [address] [place], [place] [id number] Pearlean Botts MD IH:[id number]    Magnesium  11/21/2023 2.2  1.7 - 2.4 mg/dL Final   Performed at [facility], [address], [place], [place] [address]   Alcohol, Ethyl (B) 11/21/2023 <10  <10 mg/dL Final   Comment: (NOTE) Lowest detectable limit for serum alcohol is 10 mg/dL.  For medical purposes only. Performed at [facility], [address], [place], [place] [address]    Cholesterol 11/21/2023 205 (H)  0 - 200 mg/dL Final   Triglycerides 88/41/6606 99  <150 mg/dL Final   HDL 30/16/0109 63  >40 mg/dL Final   Total CHOL/HDL Ratio 11/21/2023 3.3  RATIO Final   VLDL 11/21/2023 20  0 - 40 mg/dL Final   LDL Cholesterol 11/21/2023 122 (H)  0 - 99 mg/dL Final   Comment:        Total  Cholesterol/HDL:CHD Risk Coronary Heart Disease Risk Table                     Men   Women  1/2 Average Risk   3.4   3.3  Average Risk       5.0   4.4  2 X Average Risk   9.6   7.1  3 X Average Risk  23.4   11.0        Use the calculated Patient Ratio above and the CHD Risk Table to determine the patient's CHD  Risk.        ATP III CLASSIFICATION (LDL):  <100     mg/dL   Optimal  829-562  mg/dL   Near or Above                    Optimal  130-159  mg/dL   Borderline  130-865  mg/dL   High  >784     mg/dL   Very High Performed at [facility], [address], [place], [place] [address]    TSH 11/21/2023 0.723  0.350 - 4.500 uIU/mL Final   Comment: Performed by a 3rd Generation assay with a functional sensitivity of <=0.01 uIU/mL. Performed at [facility], [address] [place], [place] [address]    Color, Urine 11/21/2023 AMBER (A)  YELLOW Final   BIOCHEMICALS MAY BE AFFECTED BY COLOR   APPearance 11/21/2023 CLOUDY (A)  CLEAR Final   Specific Gravity, Urine 11/21/2023 1.021  1.005 - 1.030 Final   pH 11/21/2023 6.0  5.0 - 8.0 Final   Glucose, UA 11/21/2023 NEGATIVE  NEGATIVE mg/dL Final   Hgb urine dipstick 11/21/2023 NEGATIVE  NEGATIVE Final   Bilirubin Urine 11/21/2023 NEGATIVE  NEGATIVE Final   Ketones, ur 11/21/2023 NEGATIVE  NEGATIVE mg/dL Final   Protein, ur 32/44/0102 30 (A)  NEGATIVE mg/dL Final   Nitrite 72/53/6644 NEGATIVE  NEGATIVE Final   Leukocytes,Ua 11/21/2023 SMALL (A)  NEGATIVE Final   RBC / HPF 11/21/2023 0-5  0 - 5 RBC/hpf Final   WBC, UA 11/21/2023 6-10  0 - 5 WBC/hpf Final   Bacteria, UA 11/21/2023 RARE (A)  NONE SEEN Final   Squamous Epithelial / HPF 11/21/2023 21-50  0 - 5 /HPF Final   Mucus 11/21/2023 PRESENT   Final   Performed at [facility], [address], [place], [place] [address]   POC Amphetamine UR 11/21/2023 None Detected  NONE DETECTED (Cut Off Level 1000 ng/mL) Final   POC Secobarbital (BAR) 11/21/2023 None Detected   NONE DETECTED (Cut Off Level 300 ng/mL) Final   POC Buprenorphine (BUP) 11/21/2023 None Detected  NONE DETECTED (Cut Off Level 10 ng/mL) Final   POC Oxazepam (BZO) 11/21/2023 None Detected  NONE DETECTED (Cut Off Level 300 ng/mL) Final   POC Cocaine UR 11/21/2023 Positive (A)  NONE DETECTED (Cut Off Level 300 ng/mL) Final   POC Methamphetamine UR 11/21/2023 None Detected  NONE DETECTED (Cut Off Level 1000 ng/mL) Final   POC Morphine 11/21/2023 None Detected  NONE DETECTED (Cut Off Level 300 ng/mL) Final   POC Methadone UR 11/21/2023 None Detected  NONE DETECTED (Cut Off Level 300 ng/mL) Final   POC Oxycodone UR 11/21/2023 None Detected  NONE DETECTED (Cut Off Level 100 ng/mL) Final   POC Marijuana UR 11/21/2023 None Detected  NONE DETECTED (Cut Off Level 50 ng/mL) Final   Vitamin B-12 11/21/2023 306  180 - 914 pg/mL Final   Comment: (NOTE) This assay is not validated for testing neonatal or myeloproliferative syndrome specimens for Vitamin B12 levels. Performed at [facility], [address], [place], [place] [address]    T3, Free 11/21/2023 3.4  2.0 - 4.4 pg/mL Final   Comment: (NOTE) Performed At: [facility] [address] [place], [place] [id number] Pearlean Botts MD RJ:[id number]    Free T4 11/21/2023 0.88  0.61 - 1.12 ng/dL Final   Comment: (NOTE) Biotin ingestion may interfere with free T4 tests. If the results are inconsistent with the TSH  level, previous test results, or the clinical presentation, then consider biotin interference. If needed, order repeat testing after stopping biotin. Performed at [facility], [address], [place], [place] [address]    Preg Test, Ur 11/21/2023 NEGATIVE  NEGATIVE Final   Comment:        THE SENSITIVITY OF THIS METHODOLOGY IS >24 mIU/mL     Allergies: Patient has no known allergies.  Medications:  PTA Medications  Medication Sig   amLODipine  (NORVASC ) 10 MG tablet Take 1 tablet (10 mg total) by mouth daily.    DULoxetine  (CYMBALTA ) 60 MG capsule Take 1 capsule (60 mg total) by mouth daily.   risperiDONE  (RISPERDAL ) 4 MG tablet Take 1 tablet (4 mg total) by mouth at bedtime.   gabapentin  (NEURONTIN ) 100 MG capsule Take 2 capsules (200 mg total) by mouth 3 (three) times daily.   Vitamin D , Ergocalciferol , (DRISDOL ) 1.25 MG (50000 UNIT) CAPS capsule Take 1 capsule (50,000 Units total) by mouth every 7 (seven) days.   aspirin  EC 81 MG tablet Take 1 tablet (81 mg total) by mouth at bedtime. Swallow whole.   atorvastatin  (LIPITOR) 40 MG tablet Take 1 tablet (40 mg total) by mouth at bedtime.      Medical Decision Making  Observation unit    Recommendations  Based on my evaluation the patient does not appear to have an emergency medical condition.  Dorthea Gauze, NP 02/09/24  1:57 AM

## 2024-02-09 NOTE — ED Notes (Signed)
Patient observed resting quietly, eyes closed. Respirations equal and unlabored. Will continue to monitor for safety.  
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
## 2024-02-09 NOTE — ED Notes (Signed)
 Patient is sleeping. Respirations equal and unlabored, skin warm and dry. No change in assessment or acuity. Routine safety checks conducted according to facility protocol. Will continue to monitor for safety.

## 2024-02-09 NOTE — ED Provider Notes (Signed)
 Facility Based Crisis Admission H&P  Date: 02/09/24 Patient Name: Alexandra Buckley MRN: [id number] Chief Complaint: cocaine abuse, homelessness  Diagnoses:  Final diagnoses:  Stimulant use disorder  Cocaine abuse (HCC)  Hypertension, unspecified type    HPI:  Alexandra Buckley, 53 y/o female with a history of MDD, cocaine abuse, polysubstance abuse, SI, presented to [facility] voluntarily.  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  I review of patient records show a history of cocaine abuse and homelessness.  Patient is currently not seeing a psychiatrist or therapist.  Patient is requesting detox from cocaine use, last use prior to [facility], with hopeful transition to residential rehabilitation to maintain sobriety.   The patient reports depression and suicidal ideations in the setting of ongoing cocaine use.  She reports her substance use is driven by her homelessness and limited social support.  She has been homeless for the past 2 months, and had previously been living with an ex boyfriend.  The patient reports limited social support, she has an adult son who is 31 years old and lives in the [place] area but is not in contact with him.  She reports suicidal ideations, stating "I could not take it anymore".  She denies any specific intent or plan on interview, is primarily motivated to complete detox from cocaine and transition to residential rehabilitation.  She is amenable to transfer to [facility].  Patient is motivated to get sober as stated "I want to get my life back together, I want to be better for myself".   Patient's depressive symptoms are characterized by low mood, anhedonia, fatigue, feelings of hopelessness.   The patient has no pending legal charges or upcoming court dates.  She reports no access to firearms.   Past Psychiatric  History:  Diagnoses: MDD, cocaine use d/o, substance induced mood d/o, tobacco use d/o, suicide attempt x2 (last time 08/2022), inpatient psych admission Medication trials: doesn't remember, as documented past trial of Zoloft  Hospitalizations: 2 prior psychiatric hospitalizations.  Last admission at [facility] from 11/21/2023-11/30/2023 for worsening substance use and SI. Suicide attempts: x2 - 08/2022, 2018 - ingestion of cleaning products   Substance Use History: Nicotine :  reports that she has quit smoking. Her smoking use included cigarettes. She has never used smokeless tobacco. IV drug use: Denied Stimulants: Cocaine, active, daily use DT: Denied Detox: Yes Residential: Yes, previously discharged from [facility] to [facility] in 07/14/2023 Past Medical History:  Dx:  has a past medical history of History of suicide attempt (08/05/2022) and Tobacco use disorder (08/05/2022).  History of CVA in Aug 2024 Allergies: Patient has no known allergies.  Family History: unknown Family Psychiatric History obtained from chart review:  Brother, Sister- Crack Cocaine Abuse No Known Diagnosis' or Suicides Social History:  Housing: homeless Income: unemployed Family: daughter, grandchildren PHQ 2-9:  Flowsheet Row ED from 07/09/2023 in [facility] ED from 07/08/2023 in [facility] ED from 08/04/2022 in [facility]  Thoughts that you would be better off dead, or of hurting yourself in some way Not at all Not at all More than half the days  PHQ-9 Total Score 8 7 10        Flowsheet Row ED from 02/09/2024 in [facility] Most recent reading at 02/09/2024  2:22 AM Admission (Discharged)  [facility] Most recent reading at 11/21/2023  5:40 PM ED from 11/21/2023 in [facility] Most recent reading at 11/21/2023  1:59 PM  C-SSRS RISK  CATEGORY Low Risk Low Risk Low Risk         Total Time spent with patient: 1.5 hours  Musculoskeletal  Strength & Muscle Tone: within normal limits Gait & Station: normal Patient leans: N/A  Psychiatric Specialty Exam  Presentation General Appearance:  Appropriate for Environment  Eye Contact: Fleeting  Speech: Clear and Coherent  Speech Volume: Decreased   Mood and Affect  Mood: Euthymic  Affect: Congruent   Thought Process  Thought Processes: Linear  Descriptions of Associations:Intact  Orientation:Full (Time, Place and Person)  Thought Content:WDL  Diagnosis of Schizophrenia or Schizoaffective disorder in past: No   Hallucinations:Hallucinations: None  Ideas of Reference:None  Suicidal Thoughts:Suicidal Thoughts: Yes, Passive SI Passive Intent and/or Plan: Without Plan; Without Intent  Homicidal Thoughts:Homicidal Thoughts: No   Sensorium  Memory: Immediate Fair  Judgment: Poor  Insight: Fair   Chartered certified accountant: Fair  Attention Span: Fair  Recall: Good  Fund of Knowledge: Good  Language: Good   Psychomotor Activity  Psychomotor Activity: Psychomotor Activity: Normal   Assets  Assets: Desire for Improvement; Housing; Vocational/Educational   Sleep  Sleep: Sleep: Fair Number of Hours of Sleep: 8   Nutritional Assessment (For OBS and FBC admissions only) Has the patient had a weight loss or gain of 10 pounds or more in the last 3 months?: No Has the patient had a decrease in food intake/or appetite?: No Does the patient have dental problems?: No Does the patient have eating habits or behaviors that may be indicators of an eating disorder including binging or inducing vomiting?: No Has the patient recently lost weight without trying?: 0 Has the patient been eating poorly because of a decreased appetite?: 0 Malnutrition Screening Tool Score: 0    Physical Exam Vitals and nursing note  reviewed.  Constitutional:      General: She is not in acute distress.    Appearance: She is not ill-appearing.  HENT:     Head: Normocephalic and atraumatic.  Pulmonary:     Effort: Pulmonary effort is normal. No respiratory distress.  Musculoskeletal:        General: Normal range of motion.  Skin:    General: Skin is warm and dry.          Comments: Laceration at the R gluteal region, nonsuppurative, nonpurulent  Neurological:     General: No focal deficit present.    Review of Systems  All other systems reviewed and are negative.   Last menstrual period 11/18/2018. There is no height or weight on file to calculate BMI.   Last Labs:  Admission on 02/09/2024, Discharged on 02/09/2024  Component Date Value Ref Range Status   Preg Test, Ur 02/09/2024 Negative  Negative Final   POC Amphetamine UR 02/09/2024 None Detected  NONE DETECTED (Cut Off Level 1000 ng/mL) Final   POC Secobarbital (BAR) 02/09/2024 None Detected  NONE DETECTED (Cut Off Level 300 ng/mL) Final   POC Buprenorphine (BUP) 02/09/2024 None Detected  NONE DETECTED (Cut Off Level 10 ng/mL) Final   POC Oxazepam (BZO) 02/09/2024 None Detected  NONE DETECTED (Cut Off Level 300 ng/mL) Final   POC Cocaine UR 02/09/2024 Positive (A)  NONE DETECTED (Cut Off Level 300 ng/mL) Final   POC Methamphetamine UR 02/09/2024 None Detected  NONE DETECTED (Cut Off Level 1000 ng/mL) Final   POC Morphine 02/09/2024 None Detected  NONE DETECTED (Cut Off Level 300 ng/mL) Final   POC Methadone UR 02/09/2024 None Detected  NONE DETECTED (Cut Off Level 300 ng/mL) Final   POC Oxycodone UR 02/09/2024 None Detected  NONE DETECTED (Cut Off Level 100 ng/mL) Final   POC Marijuana UR 02/09/2024 Positive (A)  NONE DETECTED (Cut Off Level 50 ng/mL) Final  Admission on 11/21/2023, Discharged on 11/29/2023  Component Date Value Ref Range Status   Folate 11/24/2023 7.2  >5.9 ng/mL Final   Performed at [facility], [address], [place], [place] [address]   RPR Ser Ql 11/24/2023 NON REACTIVE  NON REACTIVE Final   Performed at [facility], [address], [place], [place] [address]   Vit D, 25-Hydroxy 11/24/2023 10.71 (L)  30 - 100 ng/mL Final   Comment: (NOTE) Vitamin D  deficiency has been defined by the Institute of Medicine  and an Endocrine Society practice guideline as a level of serum 25-OH  vitamin D  less than 20 ng/mL (1,2). The Endocrine Society went on to  further define vitamin D  insufficiency as a level between 21 and 29  ng/mL (2).  1. IOM (Institute of Medicine). 2010. Dietary reference intakes for  calcium  and D. [place]: [employer]. 2. Holick MF, Binkley [place], Bischoff-Ferrari HA, et al. Evaluation,  treatment, and prevention of vitamin D  deficiency: an Endocrine  Society clinical practice guideline, JCEM. 2011 Jul; 96(7): 1911-30.  Performed at [facility], [address], [place], [place] [address]    Sodium 11/24/2023 142  135 - 145 mmol/L Final   Potassium 11/24/2023 3.9  3.5 - 5.1 mmol/L Final   Chloride 11/24/2023 110  98 - 111 mmol/L Final   CO2 11/24/2023 26  22 - 32 mmol/L Final   Glucose, Bld 11/24/2023 81  70 - 99 mg/dL Final   Glucose reference range applies only to samples taken after fasting for at least 8 hours.   BUN 11/24/2023 14  6 - 20 mg/dL Final   Creatinine, Ser 11/24/2023 0.62  0.44 - 1.00 mg/dL Final   Calcium  11/24/2023 9.0  8.9 - 10.3 mg/dL Final   GFR, Estimated 11/24/2023 >60  >60 mL/min Final   Comment: (NOTE) Calculated using the CKD-EPI Creatinine Equation (2021)    Anion gap 11/24/2023 6  5 - 15 Final   Performed at [facility], [address] [place], [place] [address]   Sed Rate 11/24/2023 11  0 - 22 mm/hr Final   Performed at [facility], [address], [place], [place] [address]   CRP 11/24/2023 0.8  <1.0 mg/dL Final   Performed at [facility], [address]  [place], [place] [address]   ANA Ab, IFA 11/24/2023 Negative   Final   Comment: (NOTE)                                     Negative   <1:80                                     Borderline  1:80  Positive   >1:80 ICAP nomenclature: AC-0 For more information about Hep-2 cell patterns use ANApatterns.org, the official website for the International Consensus on Antinuclear Antibody (ANA) Patterns (ICAP). Performed At: [facility] [address] [place], [place] [id number] Pearlean Botts MD WU:[id number]   Admission on 11/21/2023, Discharged on 11/21/2023  Component Date Value Ref Range Status   WBC 11/21/2023 6.5  4.0 - 10.5 K/uL Final   RBC 11/21/2023 4.91  3.87 - 5.11 MIL/uL Final   Hemoglobin 11/21/2023 14.0  12.0 - 15.0 g/dL Final   HCT 95/62/1308 44.1  36.0 - 46.0 % Final   MCV 11/21/2023 89.8  80.0 - 100.0 fL Final   MCH 11/21/2023 28.5  26.0 - 34.0 pg Final   MCHC 11/21/2023 31.7  30.0 - 36.0 g/dL Final   RDW 65/78/4696 14.0  11.5 - 15.5 % Final   Platelets 11/21/2023 259  150 - 400 K/uL Final   nRBC 11/21/2023 0.0  0.0 - 0.2 % Final   Neutrophils Relative % 11/21/2023 61  % Final   Neutro Abs 11/21/2023 4.0  1.7 - 7.7 K/uL Final   Lymphocytes Relative 11/21/2023 31  % Final   Lymphs Abs 11/21/2023 2.0  0.7 - 4.0 K/uL Final   Monocytes Relative 11/21/2023 6  % Final   Monocytes Absolute 11/21/2023 0.4  0.1 - 1.0 K/uL Final   Eosinophils Relative 11/21/2023 1  % Final   Eosinophils Absolute 11/21/2023 0.1  0.0 - 0.5 K/uL Final   Basophils Relative 11/21/2023 1  % Final   Basophils Absolute 11/21/2023 0.0  0.0 - 0.1 K/uL Final   Immature Granulocytes 11/21/2023 0  % Final   Abs Immature Granulocytes 11/21/2023 0.02  0.00 - 0.07 K/uL Final   Performed at [facility], [address], [place], [place] [address]   Sodium 11/21/2023 146 (H)  135 - 145 mmol/L Final   Potassium 11/21/2023 3.7  3.5 - 5.1 mmol/L Final   Chloride 11/21/2023  104  98 - 111 mmol/L Final   CO2 11/21/2023 29  22 - 32 mmol/L Final   Glucose, Bld 11/21/2023 96  70 - 99 mg/dL Final   Glucose reference range applies only to samples taken after fasting for at least 8 hours.   BUN 11/21/2023 13  6 - 20 mg/dL Final   Creatinine, Ser 11/21/2023 0.80  0.44 - 1.00 mg/dL Final   Calcium  11/21/2023 9.3  8.9 - 10.3 mg/dL Final   Total Protein 41/32/4401 6.6  6.5 - 8.1 g/dL Final   Albumin 02/72/5366 3.9  3.5 - 5.0 g/dL Final   AST 44/12/4740 17  15 - 41 U/L Final   ALT 11/21/2023 19  0 - 44 U/L Final   Alkaline Phosphatase 11/21/2023 80  38 - 126 U/L Final   Total Bilirubin 11/21/2023 0.4  0.0 - 1.2 mg/dL Final   GFR, Estimated 11/21/2023 >60  >60 mL/min Final   Comment: (NOTE) Calculated using the CKD-EPI Creatinine Equation (2021)    Anion gap 11/21/2023 13  5 - 15 Final   Performed at [facility], [address], [place], [place] [address]   Hgb A1c MFr Bld 11/21/2023 5.8 (H)  4.8 - 5.6 % Final   Comment: (NOTE)         Prediabetes: 5.7 - 6.4         Diabetes: >6.4         Glycemic control for adults with diabetes: <7.0    Mean Plasma Glucose  11/21/2023 120  mg/dL Final   Comment: (NOTE) Performed At: [facility] [address] [place], [place] [id number] Pearlean Botts MD ON:[id number]    Magnesium  11/21/2023 2.2  1.7 - 2.4 mg/dL Final   Performed at [facility], [address], [place], [place] [address]   Alcohol, Ethyl (B) 11/21/2023 <10  <10 mg/dL Final   Comment: (NOTE) Lowest detectable limit for serum alcohol is 10 mg/dL.  For medical purposes only. Performed at [facility], [address], [place], [place] [address]    Cholesterol 11/21/2023 205 (H)  0 - 200 mg/dL Final   Triglycerides 66/44/0347 99  <150 mg/dL Final   HDL 42/59/5638 63  >40 mg/dL Final   Total CHOL/HDL Ratio 11/21/2023 3.3  RATIO Final   VLDL 11/21/2023 20  0 - 40 mg/dL Final   LDL Cholesterol 11/21/2023 122 (H)  0 - 99 mg/dL Final    Comment:        Total Cholesterol/HDL:CHD Risk Coronary Heart Disease Risk Table                     Men   Women  1/2 Average Risk   3.4   3.3  Average Risk       5.0   4.4  2 X Average Risk   9.6   7.1  3 X Average Risk  23.4   11.0        Use the calculated Patient Ratio above and the CHD Risk Table to determine the patient's CHD Risk.        ATP III CLASSIFICATION (LDL):  <100     mg/dL   Optimal  756-433  mg/dL   Near or Above                    Optimal  130-159  mg/dL   Borderline  295-188  mg/dL   High  >416     mg/dL   Very High Performed at [facility], [address] [place], [place] [address]    TSH 11/21/2023 0.723  0.350 - 4.500 uIU/mL Final   Comment: Performed by a 3rd Generation assay with a functional sensitivity of <=0.01 uIU/mL. Performed at [facility], [address] [place], [place] [address]    Preg Test, Ur 11/21/2023 Negative  Negative Final   Color, Urine 11/21/2023 AMBER (A)  YELLOW Final   BIOCHEMICALS MAY BE AFFECTED BY COLOR   APPearance 11/21/2023 CLOUDY (A)  CLEAR Final   Specific Gravity, Urine 11/21/2023 1.021  1.005 - 1.030 Final   pH 11/21/2023 6.0  5.0 - 8.0 Final   Glucose, UA 11/21/2023 NEGATIVE  NEGATIVE mg/dL Final   Hgb urine dipstick 11/21/2023 NEGATIVE  NEGATIVE Final   Bilirubin Urine 11/21/2023 NEGATIVE  NEGATIVE Final   Ketones, ur 11/21/2023 NEGATIVE  NEGATIVE mg/dL Final   Protein, ur 93/23/5573 30 (A)  NEGATIVE mg/dL Final   Nitrite 22/11/5425 NEGATIVE  NEGATIVE Final   Leukocytes,Ua 11/21/2023 SMALL (A)  NEGATIVE Final   RBC / HPF 11/21/2023 0-5  0 - 5 RBC/hpf Final   WBC, UA 11/21/2023 6-10  0 - 5 WBC/hpf Final   Bacteria, UA 11/21/2023 RARE (A)  NONE SEEN Final   Squamous Epithelial / HPF 11/21/2023 21-50  0 - 5 /HPF Final   Mucus 11/21/2023 PRESENT   Final   Performed at [facility], [address] [place], [place] [address]  POC Amphetamine UR 11/21/2023 None Detected  NONE DETECTED (Cut Off  Level 1000 ng/mL) Final   POC Secobarbital (BAR) 11/21/2023 None Detected  NONE DETECTED (Cut Off Level 300 ng/mL) Final   POC Buprenorphine (BUP) 11/21/2023 None Detected  NONE DETECTED (Cut Off Level 10 ng/mL) Final   POC Oxazepam (BZO) 11/21/2023 None Detected  NONE DETECTED (Cut Off Level 300 ng/mL) Final   POC Cocaine UR 11/21/2023 Positive (A)  NONE DETECTED (Cut Off Level 300 ng/mL) Final   POC Methamphetamine UR 11/21/2023 None Detected  NONE DETECTED (Cut Off Level 1000 ng/mL) Final   POC Morphine 11/21/2023 None Detected  NONE DETECTED (Cut Off Level 300 ng/mL) Final   POC Methadone UR 11/21/2023 None Detected  NONE DETECTED (Cut Off Level 300 ng/mL) Final   POC Oxycodone UR 11/21/2023 None Detected  NONE DETECTED (Cut Off Level 100 ng/mL) Final   POC Marijuana UR 11/21/2023 None Detected  NONE DETECTED (Cut Off Level 50 ng/mL) Final   Vitamin B-12 11/21/2023 306  180 - 914 pg/mL Final   Comment: (NOTE) This assay is not validated for testing neonatal or myeloproliferative syndrome specimens for Vitamin B12 levels. Performed at [facility], [address], [place], [place] [address]    T3, Free 11/21/2023 3.4  2.0 - 4.4 pg/mL Final   Comment: (NOTE) Performed At: [facility] [address] [place], [place] [id number] Pearlean Botts MD AO:[id number]    Free T4 11/21/2023 0.88  0.61 - 1.12 ng/dL Final   Comment: (NOTE) Biotin ingestion may interfere with free T4 tests. If the results are inconsistent with the TSH level, previous test results, or the clinical presentation, then consider biotin interference. If needed, order repeat testing after stopping biotin. Performed at [facility], [address] [place], [place] [address]    Preg Test, Ur 11/21/2023 NEGATIVE  NEGATIVE Final   Comment:        THE SENSITIVITY OF THIS METHODOLOGY IS >24 mIU/mL     Allergies: Patient has no known allergies.  Medications:  Facility Ordered Medications   Medication   acetaminophen  (TYLENOL ) tablet 650 mg   alum & mag hydroxide-simeth (MAALOX/MYLANTA) 200-200-20 MG/5ML suspension 30 mL   magnesium  hydroxide (MILK OF MAGNESIA) suspension 30 mL   haloperidol  (HALDOL ) tablet 5 mg   And   diphenhydrAMINE  (BENADRYL ) capsule 50 mg   haloperidol  lactate (HALDOL ) injection 5 mg   And   diphenhydrAMINE  (BENADRYL ) injection 50 mg   And   LORazepam  (ATIVAN ) injection 2 mg   haloperidol  lactate (HALDOL ) injection 10 mg   And   diphenhydrAMINE  (BENADRYL ) injection 50 mg   And   LORazepam  (ATIVAN ) injection 2 mg   hydrOXYzine  (ATARAX ) tablet 25 mg   traZODone  (DESYREL ) tablet 50 mg   [START ON 02/10/2024] amLODipine  (NORVASC ) tablet 5 mg   neomycin-bacitracin-polymyxin [phone] OINT 1 Application   PTA Medications  Medication Sig   Vitamin D , Ergocalciferol , (DRISDOL ) 1.25 MG (50000 UNIT) CAPS capsule Take 1 capsule (50,000 Units total) by mouth every 7 (seven) days. (Patient not taking: Reported on 02/09/2024)   aspirin  EC 81 MG tablet Take 1 tablet (81 mg total) by mouth at bedtime. Swallow whole. (Patient not taking: Reported on 02/09/2024)   atorvastatin  (LIPITOR) 40 MG tablet Take 1 tablet (40 mg total) by mouth at bedtime. (Patient not taking: Reported on 02/09/2024)   amLODipine  (NORVASC ) 5 MG tablet Take 1 tablet (5 mg total) by mouth daily.    Long Term Goals: Improvement  in symptoms so as ready for discharge  Short Term Goals: Patient will verbalize feelings in meetings with treatment team members., Patient will attend at least of 50% of the groups daily., Pt will complete the PHQ9 on admission, day 3 and discharge., and Patient will participate in completing the [place] Suicide Severity Rating Scale  Medical Decision Making  Status: Voluntary   Psychiatric Diagnoses and Treatment:  None recommended at this time   Medical Issues Being Addressed:   HTN Restart home amlodipine   R gluteal laceration Wound care and neomycin  ointment  Other PRNs: acetaminophen , 650 mg, Q6H PRN alum & mag hydroxide-simeth, 30 mL, Q4H PRN haloperidol , 5 mg, TID PRN  And diphenhydrAMINE , 50 mg, TID PRN haloperidol  lactate, 5 mg, TID PRN  And diphenhydrAMINE , 50 mg, TID PRN  And LORazepam , 2 mg, TID PRN haloperidol  lactate, 10 mg, TID PRN  And diphenhydrAMINE , 50 mg, TID PRN  And LORazepam , 2 mg, TID PRN hydrOXYzine , 25 mg, TID PRN magnesium  hydroxide, 30 mL, Daily PRN traZODone , 50 mg, QHS PRN    Other Labs/Imaging Reviewed: Urine pregnancy - negative UDS positive for cannabis and cocaine CBC and CMP pending  EKG pending   Disposition: pt expressed interest in residential rehabilitation    Recommendations  Based on my evaluation the patient does not appear to have an emergency medical condition.  Baltazar Bonier, MD 02/09/24  3:40 PM

## 2024-02-09 NOTE — ED Provider Notes (Signed)
 FBC/OBS ASAP Discharge Summary  Date and Time: 02/09/2024 12:20 PM  Name: Alexandra Buckley  MRN:  [id number]   Discharge Diagnoses:  Final diagnoses:  Suicidal ideation  Cocaine abuse (HCC)  Homelessness unspecified    Subjective:   Alexandra Buckley, 52 y/o female with a history of MDD, cocaine abuse, polysubstance abuse, SI, presented to [facility] voluntarily.  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  I review of patient records show a history of cocaine abuse and homelessness.  Patient is currently not seeing a psychiatrist or therapist.  Patient is requesting detox from cocaine use, last use prior to [facility], with hopeful transition to residential rehabilitation to maintain sobriety.  The patient reports depression and suicidal ideations in the setting of ongoing cocaine use.  She reports her substance use is driven by her homelessness and limited social support.  She has been homeless for the past 2 months, and had previously been living with an ex boyfriend.  The patient reports limited social support, she has an adult son who is 89 years old and lives in the [place] area but is not in contact with him.  She reports suicidal ideations, stating "I could not take it anymore".  She denies any specific intent or plan on interview, is primarily motivated to complete detox from cocaine and transition to residential rehabilitation.  She is amenable to transfer to [facility].  Patient is motivated to get sober as stated "I want to get my life back together, I want to be better for myself".  Patient's depressive symptoms are characterized by low mood, anhedonia, fatigue, feelings of hopelessness.  The patient has no pending legal charges or upcoming court dates.  She reports no access to firearms.   Stay Summary:  During the patient's hospitalization,  patient had extensive initial psychiatric evaluation, and follow-up psychiatric evaluations every day.  Psychiatric diagnoses provided upon initial assessment:  Stimulant use disorder, cocaine type Substance induced mood disorder  Patient's psychiatric medications were adjusted on admission: None  No scheduled medications were started while patient was held on [facility] observation.   Patient's care was discussed during the interdisciplinary team meeting every day during the hospitalization.  Total Time spent with patient: 1.5 hours  Past Psychiatric History:  Diagnoses: MDD, cocaine use d/o, substance induced mood d/o, tobacco use d/o, suicide attempt x2 (last time 08/2022), inpatient psych admission Medication trials: doesn't remember, as documented past trial of Zoloft  Hospitalizations: 2 prior psychiatric hospitalizations.  Last admission at [facility] from 11/21/2023-11/30/2023 for worsening substance use and SI. Suicide attempts: x2 - 08/2022, 2018 - ingestion of cleaning products  Substance Use History: Nicotine :  reports that she has quit smoking. Her smoking use included cigarettes. She has never used smokeless tobacco. IV drug use: Denied Stimulants: Cocaine, active, daily use DT: Denied Detox: Yes Residential: Yes, previously discharged from [facility] to [facility] in 07/13/2024 Past Medical History:  Dx:  has a past medical history of History of suicide attempt (08/05/2022) and Tobacco use disorder (08/05/2022).  History of CVA in Aug 2024 Allergies: Patient has no known allergies.  Family History: unknown Family Psychiatric History obtained from chart review:  Brother, Sister- Crack Cocaine Abuse No Known Diagnosis' or Suicides Social History:  Housing: homeless Income: unemployed Family: daughter, grandchildren    Current Medications:  Current Facility-Administered Medications  Medication Dose Route  Frequency Provider Last Rate Last Admin   acetaminophen  (TYLENOL ) tablet 650 mg  650  mg Oral Q6H PRN Dorthea Gauze, NP       alum & mag hydroxide-simeth (MAALOX/MYLANTA) 200-200-20 MG/5ML suspension 30 mL  30 mL Oral Q4H PRN Dorthea Gauze, NP       magnesium  hydroxide (MILK OF MAGNESIA) suspension 30 mL  30 mL Oral Daily PRN Dorthea Gauze, NP       OLANZapine (ZYPREXA) injection 10 mg  10 mg Intramuscular TID PRN Dorthea Gauze, NP       OLANZapine (ZYPREXA) injection 5 mg  5 mg Intramuscular TID PRN Dorthea Gauze, NP       OLANZapine zydis (ZYPREXA) disintegrating tablet 5 mg  5 mg Oral TID PRN Dorthea Gauze, NP       Current Outpatient Medications  Medication Sig Dispense Refill   amLODipine  (NORVASC ) 10 MG tablet Take 1 tablet (10 mg total) by mouth daily. (Patient not taking: Reported on 02/09/2024) 30 tablet 0   aspirin  EC 81 MG tablet Take 1 tablet (81 mg total) by mouth at bedtime. Swallow whole. (Patient not taking: Reported on 02/09/2024) 30 tablet 0   atorvastatin  (LIPITOR) 40 MG tablet Take 1 tablet (40 mg total) by mouth at bedtime. (Patient not taking: Reported on 02/09/2024) 30 tablet 0   DULoxetine  (CYMBALTA ) 60 MG capsule Take 1 capsule (60 mg total) by mouth daily. (Patient not taking: Reported on 02/09/2024) 30 capsule 0   gabapentin  (NEURONTIN ) 100 MG capsule Take 2 capsules (200 mg total) by mouth 3 (three) times daily. (Patient not taking: Reported on 02/09/2024) 180 capsule 0   risperiDONE  (RISPERDAL ) 4 MG tablet Take 1 tablet (4 mg total) by mouth at bedtime. (Patient not taking: Reported on 02/09/2024) 30 tablet 0   Vitamin D , Ergocalciferol , (DRISDOL ) 1.25 MG (50000 UNIT) CAPS capsule Take 1 capsule (50,000 Units total) by mouth every 7 (seven) days. (Patient not taking: Reported on 02/09/2024) 5 capsule 0    PTA Medications:  Facility Ordered Medications  Medication   acetaminophen  (TYLENOL ) tablet 650 mg   alum & mag hydroxide-simeth (MAALOX/MYLANTA) 200-200-20 MG/5ML suspension 30 mL   magnesium  hydroxide (MILK OF MAGNESIA) suspension 30 mL   OLANZapine zydis  (ZYPREXA) disintegrating tablet 5 mg   OLANZapine (ZYPREXA) injection 5 mg   OLANZapine (ZYPREXA) injection 10 mg   PTA Medications  Medication Sig   amLODipine  (NORVASC ) 10 MG tablet Take 1 tablet (10 mg total) by mouth daily. (Patient not taking: Reported on 02/09/2024)   DULoxetine  (CYMBALTA ) 60 MG capsule Take 1 capsule (60 mg total) by mouth daily. (Patient not taking: Reported on 02/09/2024)   risperiDONE  (RISPERDAL ) 4 MG tablet Take 1 tablet (4 mg total) by mouth at bedtime. (Patient not taking: Reported on 02/09/2024)   gabapentin  (NEURONTIN ) 100 MG capsule Take 2 capsules (200 mg total) by mouth 3 (three) times daily. (Patient not taking: Reported on 02/09/2024)   Vitamin D , Ergocalciferol , (DRISDOL ) 1.25 MG (50000 UNIT) CAPS capsule Take 1 capsule (50,000 Units total) by mouth every 7 (seven) days. (Patient not taking: Reported on 02/09/2024)   aspirin  EC 81 MG tablet Take 1 tablet (81 mg total) by mouth at bedtime. Swallow whole. (Patient not taking: Reported on 02/09/2024)   atorvastatin  (LIPITOR) 40 MG tablet Take 1 tablet (40 mg total) by mouth at bedtime. (Patient not taking: Reported on 02/09/2024)       07/12/2023    3:13 PM 07/09/2023   10:38 AM 08/06/2022  2:06 PM  Depression screen PHQ 2/9  Decreased Interest 1 1 3   Down, Depressed, Hopeless 1 1 1   PHQ - 2 Score 2 2 4   Altered sleeping 2 1 0  Tired, decreased energy 1 1 0  Change in appetite 0 0 1  Feeling bad or failure about yourself  1 1 1   Trouble concentrating 1 1   Moving slowly or fidgety/restless 1 1 2   Suicidal thoughts 0 0 2  PHQ-9 Score 8 7 10   Difficult doing work/chores Very difficult Very difficult Somewhat difficult    Flowsheet Row ED from 02/09/2024 in [facility] Most recent reading at 02/09/2024  2:22 AM [facility] ([facility] Most recent reading at 11/21/2023  5:40 PM ED from 11/21/2023 in [facility] Most recent reading at 11/21/2023  1:59 PM  C-SSRS RISK CATEGORY Low Risk Low Risk Low Risk       Musculoskeletal  Strength & Muscle Tone: within normal limits Gait & Station: normal Patient leans: N/A  Psychiatric Specialty Exam  Presentation  General Appearance:  Appropriate for Environment  Eye Contact: Fleeting  Speech: Clear and Coherent  Speech Volume: Decreased     Mood and Affect  Mood: Depressed  Affect: Congruent   Thought Process  Thought Processes: Linear  Descriptions of Associations:Intact  Orientation:Full (Time, Place and Person)  Thought Content:WDL  Diagnosis of Schizophrenia or Schizoaffective disorder in past: No    Hallucinations:Hallucinations: None  Ideas of Reference:None  Suicidal Thoughts:Suicidal Thoughts: Yes, Passive SI Passive Intent and/or Plan: Without Plan; Without Intent  Homicidal Thoughts:Homicidal Thoughts: No   Sensorium  Memory: Immediate Fair  Judgment: Poor  Insight: Fair   Chartered certified accountant: Fair  Attention Span: Fair  Recall: Good  Fund of Knowledge: Good  Language: Good   Psychomotor Activity  Psychomotor Activity: Psychomotor Activity: Normal   Assets  Assets: Desire for Improvement; Housing; Vocational/Educational   Sleep  Sleep: Sleep: Fair Number of Hours of Sleep: 8   Nutritional Assessment (For OBS and FBC admissions only) Has the patient had a weight loss or gain of 10 pounds or more in the last 3 months?: No Has the patient had a decrease in food intake/or appetite?: No Does the patient have dental problems?: No Does the patient have eating habits or behaviors that may be indicators of an eating disorder including binging or inducing vomiting?: No Has the patient recently lost weight without trying?: 0 Has the patient been eating poorly because of a decreased appetite?: 0 Malnutrition Screening Tool Score: 0    Physical Exam   Physical Exam Vitals and nursing note reviewed.  Constitutional:      General: She is not in acute distress.    Appearance: She is not ill-appearing.  HENT:     Head: Normocephalic and atraumatic.  Eyes:     Conjunctiva/sclera: Conjunctivae normal.  Pulmonary:     Effort: Pulmonary effort is normal. No respiratory distress.  Skin:    General: Skin is warm and dry.    Review of Systems  All other systems reviewed and are negative.  Blood pressure (!) 164/89, pulse 66, temperature 97.9 F (36.6 C), temperature source Oral, resp. rate 18, last menstrual period 11/18/2018, SpO2 100%. There is no height or weight on file to calculate BMI.  Demographic Factors:  Low socioeconomic status and Unemployed  Loss Factors: Financial problems/change in socioeconomic status  Historical Factors: Family history  of mental illness or substance abuse and Impulsivity  Risk Reduction Factors:   NA  Continued Clinical Symptoms:  Alcohol/Substance Abuse/Dependencies Unstable or Poor Therapeutic Relationship  Cognitive Features That Contribute To Risk:  None    Suicide Risk:  Mild:  Suicidal ideation of limited frequency, intensity, duration, and specificity.  There are no identifiable plans, no associated intent, mild dysphoria and related symptoms, good self-control (both objective and subjective assessment), few other risk factors, and identifiable protective factors, including available and accessible social support.  Plan Of Care/Follow-up recommendations:  Activity: as tolerated  Diet: heart healthy  Disposition: FBC  Baltazar Bonier, MD 02/09/2024, 12:20 PM

## 2024-02-09 NOTE — Progress Notes (Signed)
   02/09/24 0109  [facility] Triage Screening (Walk-ins at [facility] only)  How Did You Hear About Us ? Self  What Is the Reason for Your Visit/Call Today? Pt presents to [facility]  as a voluntary walk-in, unaccompanied with complaint of SI, with no plan/intent. Pt reports that she suffers from physical pain on right side of her body and " feeling like I can't get right with life". Pt hx of MDD and substance use disorder. Pt reports daily use of cocaine. Pt is not established with outpatient resources or medicatoin management at this time. Pt currently denies HI,AVH and alcohol use.  How Long Has This Been Causing You Problems? <Week  Have You Recently Had Any Thoughts About Hurting Yourself? Yes  How long ago did you have thoughts about hurting yourself? currently  Are You Planning to Commit Suicide/Harm Yourself At This time? No  Have you Recently Had Thoughts About Hurting Someone Marigene Shoulder? No  Are You Planning To Harm Someone At This Time? No  Physical Abuse Denies  Verbal Abuse Denies  Sexual Abuse Denies  Exploitation of patient/patient's resources Denies  Are you currently experiencing any auditory, visual or other hallucinations? No  Have You Used Any Alcohol or Drugs in the Past 24 Hours? Yes  What Did You Use and How Much? cocaine  Do you have any current medical co-morbidities that require immediate attention? No (Blood pressure elevated)  Clinician description of patient physical appearance/behavior: cooperative, calm, sleepy like  What Do You Feel Would Help You the Most Today? Treatment for Depression or other mood problem;Alcohol or Drug Use Treatment  If access to [facility] was not available, would you have sought care in the Emergency Department? No  Determination of Need Routine (7 days)  Options For Referral Other: Comment;Outpatient Therapy;Chemical Dependency Intensive Outpatient Therapy (CDIOP)
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
## 2024-02-09 NOTE — ED Notes (Signed)
 Patient resting quietly in bed with eyes closed. Respirations equal and unlabored, skin warm and dry, NAD. Routine safety checks conducted according to facility protocol. Will continue to monitor for safety.

## 2024-02-09 NOTE — ED Notes (Addendum)
 Alexandra Buckley is 182/78  hr 64, Pt is on 3m po amlodipine  daily.  Pt denies headache. H2o encouraged. Message left on MD phone and in Patient chart and chat for advisement. Will endorse to shift nurse to follow up.

## 2024-02-09 NOTE — ED Notes (Signed)
Patient discharged to [facility]

## 2024-02-09 NOTE — Discharge Instructions (Signed)
 Transfer to [facility]

## 2024-02-10 DIAGNOSIS — I10 Essential (primary) hypertension: Secondary | ICD-10-CM | POA: Diagnosis not present

## 2024-02-10 DIAGNOSIS — Z79899 Other long term (current) drug therapy: Secondary | ICD-10-CM | POA: Diagnosis not present

## 2024-02-10 DIAGNOSIS — F1410 Cocaine abuse, uncomplicated: Secondary | ICD-10-CM | POA: Diagnosis not present

## 2024-02-10 DIAGNOSIS — F1510 Other stimulant abuse, uncomplicated: Secondary | ICD-10-CM | POA: Diagnosis not present
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
## 2024-02-10 NOTE — Group Note (Signed)
 Group Topic: Change and Accountability  Group Date: 02/10/2024 Start Time: 1650 End Time: 1710 Facilitators: Ardie Kras L, RN  Department: [facility]  Number of Participants: 9  Group Focus: nursing group Treatment Modality:  Psychoeducation Interventions utilized were exploration, group exercise, mental fitness, and story telling Purpose: express feelings, increase insight, regain self-worth, and reinforce self-care  Name: Alexandra Buckley Date of Birth: 07-05-1971  MR: [id number]    Level of Participation: did not participate Quality of Participation: did not participate Interactions with others: did not participate Mood/Affect: did not participate Triggers (if applicable): did not participate Cognition: did not participate Progress: Other Response: did not participate Plan: follow-up needed  Patients Problems:  Patient Active Problem List   Diagnosis Date Noted   Stimulant use disorder 02/09/2024   Prediabetes 08/10/2023   H/O: CVA (cerebrovascular accident) 07/11/2023   Substance induced mood disorder (HCC) 07/09/2023   Cocaine use 05/20/2023   Acute CVA (cerebrovascular accident) (HCC) 05/19/2023   History of trichomoniasis 10/08/2022   Recurrent major depressive disorder, in remission (HCC) 08/31/2022   Psychophysiological insomnia 08/31/2022   Essential hypertension 08/11/2022   Chronic left shoulder pain 08/11/2022   Chronic bilateral low back pain without sciatica 08/11/2022   Mixed hyperlipidemia 08/11/2022   History of suicide attempt 08/05/2022   Tobacco use disorder 08/05/2022   Cocaine abuse with cocaine-induced mood disorder (HCC) 08/04/2022   MDD (major depressive disorder), recurrent severe, without psychosis (HCC) 03/02/2017

## 2024-02-10 NOTE — Discharge Planning (Signed)
 LCSW met with patient for full assessment and discussion of disposition plans. Patient is  53 y/o female with a history of MDD, cocaine abuse, polysubstance abuse, SI presenting to [facility] for SI and detox of crack cocaine use. She reports no use of any other substances or ETOH. She stated first use in 1998 and longest period of sobriety for 7 years. She reported being homeless since last 6 months but has good supports of her only son age 88 and has lived with him before. She has been to [facility] program in the last year and would like to return for residential care program when stable for discharge. Will make referral and update when received. Will continue to follow and provide supports as clinically indicated.

## 2024-02-10 NOTE — ED Notes (Signed)
 Blood work attempted, pt stated "not now" during procedure.

## 2024-02-10 NOTE — Group Note (Signed)
 Group Topic: Fears and Unhealthy Coping Skills  Group Date: 02/10/2024 Start Time: 1930 End Time: 2000 Facilitators: Alvino Joseph, NT  Department: [facility]  Number of Participants: 10  Group Focus: impulsivity Treatment Modality:  Individual Therapy Interventions utilized were group exercise Purpose: enhance coping skills  Name: Alexandra Buckley Date of Birth: September 14, 1971  MR: [id number]    Level of Participation: not active Quality of Participate n/a Interactions with others: n/a Mood/Affect: appropriate Triggers (if applicable): n/a Cognition: coherent/clear Progress: Moderate Response: n/a Plan: follow-up needed  Patients Problems:  Patient Active Problem List   Diagnosis Date Noted   Stimulant use disorder 02/09/2024   Prediabetes 08/10/2023   H/O: CVA (cerebrovascular accident) 07/11/2023   Substance induced mood disorder (HCC) 07/09/2023   Cocaine use 05/20/2023   Acute CVA (cerebrovascular accident) (HCC) 05/19/2023   History of trichomoniasis 10/08/2022   Recurrent major depressive disorder, in remission (HCC) 08/31/2022   Psychophysiological insomnia 08/31/2022   Essential hypertension 08/11/2022   Chronic left shoulder pain 08/11/2022   Chronic bilateral low back pain without sciatica 08/11/2022   Mixed hyperlipidemia 08/11/2022   History of suicide attempt 08/05/2022   Tobacco use disorder 08/05/2022   Cocaine abuse with cocaine-induced mood disorder (HCC) 08/04/2022   MDD (major depressive disorder), recurrent severe, without psychosis (HCC) 03/02/2017

## 2024-02-10 NOTE — ED Notes (Signed)
 Patient alert and oriented x 4. Received prn clonidine  for elevated BP. Patient denies SI, HI and AVH. Patient resting quietly in bed with eyes closed, Respirations equal and unlabored. Routine safety checks conducted according to facility protocol. Will continue to monitor for safety.

## 2024-02-10 NOTE — ED Provider Notes (Addendum)
 Behavioral Health Progress Note  Date and Time: 02/10/2024  Name: Alexandra Buckley MRN:  [id number]  Subjective: Chart reviewed, case discussed with staff, patient seen during rounds.  Patient continues to endorse depressed mood.  Not much change in mental status since yesterday.  Patient was trial of antidepressant which she declined.  Patient was encouraged to keep herself hydrated.  Patient reports" I feel safe here".  She denies thoughts of harming herself or others.  She denies psychotic or manic symptoms.  Patient was encouraged to work with Child psychotherapist on the unit and rehab options.  Diagnosis:  Final diagnoses:  Stimulant use disorder  Cocaine abuse (HCC)  Hypertension, unspecified type      Past Psychiatric History: See H&P Past Medical History: See H&P Family History: See H&P Family Psychiatric  History: See H&P Social History: See H&P             Sleep: Poor  Appetite:  Poor  Current Medications:  Current Facility-Administered Medications  Medication Dose Route Frequency Provider Last Rate Last Admin   acetaminophen  (TYLENOL ) tablet 650 mg  650 mg Oral Q6H PRN Carrion-Carrero, Margely, MD   650 mg at 02/10/24 1138   alum & mag hydroxide-simeth (MAALOX/MYLANTA) 200-200-20 MG/5ML suspension 30 mL  30 mL Oral Q4H PRN Carrion-Carrero, Jacalyn Martin, MD   30 mL at 02/10/24 1139   amLODipine  (NORVASC ) tablet 5 mg  5 mg Oral Daily Carrion-Carrero, Margely, MD   5 mg at 02/10/24 0947   cloNIDine  (CATAPRES ) tablet 0.1 mg  0.1 mg Oral Q6H PRN Carrion-Carrero, Margely, MD       haloperidol  (HALDOL ) tablet 5 mg  5 mg Oral TID PRN Carrion-Carrero, Margely, MD       And   diphenhydrAMINE  (BENADRYL ) capsule 50 mg  50 mg Oral TID PRN Carrion-Carrero, Margely, MD       haloperidol  lactate (HALDOL ) injection 5 mg  5 mg Intramuscular TID PRN Carrion-Carrero, Margely, MD       And   diphenhydrAMINE  (BENADRYL ) injection 50 mg  50 mg Intramuscular TID PRN Carrion-Carrero, Margely, MD        And   LORazepam  (ATIVAN ) injection 2 mg  2 mg Intramuscular TID PRN Carrion-Carrero, Margely, MD       haloperidol  lactate (HALDOL ) injection 10 mg  10 mg Intramuscular TID PRN Carrion-Carrero, Margely, MD       And   diphenhydrAMINE  (BENADRYL ) injection 50 mg  50 mg Intramuscular TID PRN Carrion-Carrero, Margely, MD       And   LORazepam  (ATIVAN ) injection 2 mg  2 mg Intramuscular TID PRN Carrion-Carrero, Margely, MD       hydrOXYzine  (ATARAX ) tablet 25 mg  25 mg Oral TID PRN Carrion-Carrero, Margely, MD       magnesium  hydroxide (MILK OF MAGNESIA) suspension 30 mL  30 mL Oral Daily PRN Carrion-Carrero, Margely, MD       neomycin -bacitracin -polymyxin [id number] OINT 1 Application  1 Application Topical Daily Carrion-Carrero, Margely, MD   1 Application at 02/10/24 0947   traZODone  (DESYREL ) tablet 50 mg  50 mg Oral QHS PRN Carrion-Carrero, Jacalyn Martin, MD       Current Outpatient Medications  Medication Sig Dispense Refill   amLODipine  (NORVASC ) 5 MG tablet Take 1 tablet (5 mg total) by mouth daily.     aspirin  EC 81 MG tablet Take 1 tablet (81 mg total) by mouth at bedtime. Swallow whole. (Patient not taking: Reported on 02/09/2024) 30 tablet 0   atorvastatin  (LIPITOR) 40 MG tablet Take  1 tablet (40 mg total) by mouth at bedtime. (Patient not taking: Reported on 02/09/2024) 30 tablet 0   Vitamin D , Ergocalciferol , (DRISDOL ) 1.25 MG (50000 UNIT) CAPS capsule Take 1 capsule (50,000 Units total) by mouth every 7 (seven) days. (Patient not taking: Reported on 02/09/2024) 5 capsule 0    Labs  Lab Results:  Admission on 02/09/2024, Discharged on 02/09/2024  Component Date Value Ref Range Status   Preg Test, Ur 02/09/2024 Negative  Negative Final   POC Amphetamine UR 02/09/2024 None Detected  NONE DETECTED (Cut Off Level 1000 ng/mL) Final   POC Secobarbital (BAR) 02/09/2024 None Detected  NONE DETECTED (Cut Off Level 300 ng/mL) Final   POC Buprenorphine (BUP) 02/09/2024 None Detected  NONE DETECTED  (Cut Off Level 10 ng/mL) Final   POC Oxazepam (BZO) 02/09/2024 None Detected  NONE DETECTED (Cut Off Level 300 ng/mL) Final   POC Cocaine UR 02/09/2024 Positive (A)  NONE DETECTED (Cut Off Level 300 ng/mL) Final   POC Methamphetamine UR 02/09/2024 None Detected  NONE DETECTED (Cut Off Level 1000 ng/mL) Final   POC Morphine 02/09/2024 None Detected  NONE DETECTED (Cut Off Level 300 ng/mL) Final   POC Methadone UR 02/09/2024 None Detected  NONE DETECTED (Cut Off Level 300 ng/mL) Final   POC Oxycodone UR 02/09/2024 None Detected  NONE DETECTED (Cut Off Level 100 ng/mL) Final   POC Marijuana UR 02/09/2024 Positive (A)  NONE DETECTED (Cut Off Level 50 ng/mL) Final  Admission on 11/21/2023, Discharged on 11/29/2023  Component Date Value Ref Range Status   Folate 11/24/2023 7.2  >5.9 ng/mL Final   Performed at [facility], [address] [place], [place] [address]   RPR Ser Ql 11/24/2023 NON REACTIVE  NON REACTIVE Final   Performed at [facility], [address] [place], [place] [address]   Vit D, 25-Hydroxy 11/24/2023 10.71 (L)  30 - 100 ng/mL Final   Comment: (NOTE) Vitamin D  deficiency has been defined by the Institute of Medicine  and an Endocrine Society practice guideline as a level of serum 25-OH  vitamin D  less than 20 ng/mL (1,2). The Endocrine Society went on to  further define vitamin D  insufficiency as a level between 21 and 29  ng/mL (2).  1. IOM (Institute of Medicine). 2010. Dietary reference intakes for  calcium  and D. [place]: [employer]. 2. Holick MF, Binkley Bluewell, Bischoff-Ferrari HA, et al. Evaluation,  treatment, and prevention of vitamin D  deficiency: an Endocrine  Society clinical practice guideline, JCEM. 2011 Jul; 96(7): 1911-30.  Performed at [facility], [address], [place], [place] [address]    Sodium 11/24/2023 142  135 - 145 mmol/L Final   Potassium 11/24/2023 3.9  3.5 - 5.1 mmol/L Final   Chloride  11/24/2023 110  98 - 111 mmol/L Final   CO2 11/24/2023 26  22 - 32 mmol/L Final   Glucose, Bld 11/24/2023 81  70 - 99 mg/dL Final   Glucose reference range applies only to samples taken after fasting for at least 8 hours.   BUN 11/24/2023 14  6 - 20 mg/dL Final   Creatinine, Ser 11/24/2023 0.62  0.44 - 1.00 mg/dL Final   Calcium  11/24/2023 9.0  8.9 - 10.3 mg/dL Final   GFR, Estimated 11/24/2023 >60  >60 mL/min Final   Comment: (NOTE) Calculated using the CKD-EPI Creatinine Equation (2021)    Anion gap 11/24/2023 6  5 - 15 Final   Performed at  [facility], [address], [place], [place] [address]   Sed Rate 11/24/2023 11  0 - 22 mm/hr Final   Performed at [facility], [address], [place], [place] [address]   CRP 11/24/2023 0.8  <1.0 mg/dL Final   Performed at [facility], [address] [place], [place] [address]   ANA Ab, IFA 11/24/2023 Negative   Final   Comment: (NOTE)                                     Negative   <1:80                                     Borderline  1:80                                     Positive   >1:80 ICAP nomenclature: AC-0 For more information about Hep-2 cell patterns use ANApatterns.org, the official website for the International Consensus on Antinuclear Antibody (ANA) Patterns (ICAP). Performed At: [facility] [address] [place], [place] [id number] Pearlean Botts MD OZ:[id number]   Admission on 11/21/2023, Discharged on 11/21/2023  Component Date Value Ref Range Status   WBC 11/21/2023 6.5  4.0 - 10.5 K/uL Final   RBC 11/21/2023 4.91  3.87 - 5.11 MIL/uL Final   Hemoglobin 11/21/2023 14.0  12.0 - 15.0 g/dL Final   HCT 62/95/2841 44.1  36.0 - 46.0 % Final   MCV 11/21/2023 89.8  80.0 - 100.0 fL Final   MCH 11/21/2023 28.5  26.0 - 34.0 pg Final   MCHC 11/21/2023 31.7  30.0 - 36.0 g/dL Final   RDW 32/44/0102 14.0  11.5 - 15.5 % Final   Platelets 11/21/2023 259  150 - 400 K/uL Final    nRBC 11/21/2023 0.0  0.0 - 0.2 % Final   Neutrophils Relative % 11/21/2023 61  % Final   Neutro Abs 11/21/2023 4.0  1.7 - 7.7 K/uL Final   Lymphocytes Relative 11/21/2023 31  % Final   Lymphs Abs 11/21/2023 2.0  0.7 - 4.0 K/uL Final   Monocytes Relative 11/21/2023 6  % Final   Monocytes Absolute 11/21/2023 0.4  0.1 - 1.0 K/uL Final   Eosinophils Relative 11/21/2023 1  % Final   Eosinophils Absolute 11/21/2023 0.1  0.0 - 0.5 K/uL Final   Basophils Relative 11/21/2023 1  % Final   Basophils Absolute 11/21/2023 0.0  0.0 - 0.1 K/uL Final   Immature Granulocytes 11/21/2023 0  % Final   Abs Immature Granulocytes 11/21/2023 0.02  0.00 - 0.07 K/uL Final   Performed at [facility], [address] [place], [place] [address]   Sodium 11/21/2023 146 (H)  135 - 145 mmol/L Final   Potassium 11/21/2023 3.7  3.5 - 5.1 mmol/L Final   Chloride 11/21/2023 104  98 - 111 mmol/L Final   CO2 11/21/2023 29  22 - 32 mmol/L Final   Glucose, Bld 11/21/2023 96  70 - 99 mg/dL Final   Glucose reference range applies only to samples taken after fasting for at least 8 hours.   BUN 11/21/2023 13  6 - 20 mg/dL Final   Creatinine, Ser 11/21/2023 0.80  0.44 - 1.00 mg/dL  Final   Calcium  11/21/2023 9.3  8.9 - 10.3 mg/dL Final   Total Protein 16/07/9603 6.6  6.5 - 8.1 g/dL Final   Albumin 54/06/8118 3.9  3.5 - 5.0 g/dL Final   AST 14/78/2956 17  15 - 41 U/L Final   ALT 11/21/2023 19  0 - 44 U/L Final   Alkaline Phosphatase 11/21/2023 80  38 - 126 U/L Final   Total Bilirubin 11/21/2023 0.4  0.0 - 1.2 mg/dL Final   GFR, Estimated 11/21/2023 >60  >60 mL/min Final   Comment: (NOTE) Calculated using the CKD-EPI Creatinine Equation (2021)    Anion gap 11/21/2023 13  5 - 15 Final   Performed at [facility], [address], [place], [place] [address]   Hgb A1c MFr Bld 11/21/2023 5.8 (H)  4.8 - 5.6 % Final   Comment: (NOTE)         Prediabetes: 5.7 - 6.4         Diabetes: >6.4         Glycemic control for  adults with diabetes: <7.0    Mean Plasma Glucose 11/21/2023 120  mg/dL Final   Comment: (NOTE) Performed At: [facility] [address] [place], [place] [id number] Pearlean Botts MD XB:[id number]    Magnesium  11/21/2023 2.2  1.7 - 2.4 mg/dL Final   Performed at [facility], [address], [place], [place] [address]   Alcohol, Ethyl (B) 11/21/2023 <10  <10 mg/dL Final   Comment: (NOTE) Lowest detectable limit for serum alcohol is 10 mg/dL.  For medical purposes only. Performed at [facility], [address], [place], [place] [address]    Cholesterol 11/21/2023 205 (H)  0 - 200 mg/dL Final   Triglycerides 03/47/4259 99  <150 mg/dL Final   HDL 56/38/7564 63  >40 mg/dL Final   Total CHOL/HDL Ratio 11/21/2023 3.3  RATIO Final   VLDL 11/21/2023 20  0 - 40 mg/dL Final   LDL Cholesterol 11/21/2023 122 (H)  0 - 99 mg/dL Final   Comment:        Total Cholesterol/HDL:CHD Risk Coronary Heart Disease Risk Table                     Men   Women  1/2 Average Risk   3.4   3.3  Average Risk       5.0   4.4  2 X Average Risk   9.6   7.1  3 X Average Risk  23.4   11.0        Use the calculated Patient Ratio above and the CHD Risk Table to determine the patient's CHD Risk.        ATP III CLASSIFICATION (LDL):  <100     mg/dL   Optimal  332-951  mg/dL   Near or Above                    Optimal  130-159  mg/dL   Borderline  884-166  mg/dL   High  >063     mg/dL   Very High Performed at [facility], [address] [place], [place] [address]    TSH 11/21/2023 0.723  0.350 - 4.500 uIU/mL Final   Comment: Performed by a 3rd Generation assay with a functional sensitivity of <=0.01 uIU/mL. Performed at [facility], [address], [place], [place] [address]    Preg Test, Ur 11/21/2023 Negative  Negative Final  Color, Urine 11/21/2023 AMBER (A)  YELLOW Final   BIOCHEMICALS MAY BE AFFECTED BY COLOR   APPearance 11/21/2023 CLOUDY (A)  CLEAR Final    Specific Gravity, Urine 11/21/2023 1.021  1.005 - 1.030 Final   pH 11/21/2023 6.0  5.0 - 8.0 Final   Glucose, UA 11/21/2023 NEGATIVE  NEGATIVE mg/dL Final   Hgb urine dipstick 11/21/2023 NEGATIVE  NEGATIVE Final   Bilirubin Urine 11/21/2023 NEGATIVE  NEGATIVE Final   Ketones, ur 11/21/2023 NEGATIVE  NEGATIVE mg/dL Final   Protein, ur 40/98/1191 30 (A)  NEGATIVE mg/dL Final   Nitrite 47/82/9562 NEGATIVE  NEGATIVE Final   Leukocytes,Ua 11/21/2023 SMALL (A)  NEGATIVE Final   RBC / HPF 11/21/2023 0-5  0 - 5 RBC/hpf Final   WBC, UA 11/21/2023 6-10  0 - 5 WBC/hpf Final   Bacteria, UA 11/21/2023 RARE (A)  NONE SEEN Final   Squamous Epithelial / HPF 11/21/2023 21-50  0 - 5 /HPF Final   Mucus 11/21/2023 PRESENT   Final   Performed at [facility], [address], [place], [place] [address]   POC Amphetamine UR 11/21/2023 None Detected  NONE DETECTED (Cut Off Level 1000 ng/mL) Final   POC Secobarbital (BAR) 11/21/2023 None Detected  NONE DETECTED (Cut Off Level 300 ng/mL) Final   POC Buprenorphine (BUP) 11/21/2023 None Detected  NONE DETECTED (Cut Off Level 10 ng/mL) Final   POC Oxazepam (BZO) 11/21/2023 None Detected  NONE DETECTED (Cut Off Level 300 ng/mL) Final   POC Cocaine UR 11/21/2023 Positive (A)  NONE DETECTED (Cut Off Level 300 ng/mL) Final   POC Methamphetamine UR 11/21/2023 None Detected  NONE DETECTED (Cut Off Level 1000 ng/mL) Final   POC Morphine 11/21/2023 None Detected  NONE DETECTED (Cut Off Level 300 ng/mL) Final   POC Methadone UR 11/21/2023 None Detected  NONE DETECTED (Cut Off Level 300 ng/mL) Final   POC Oxycodone UR 11/21/2023 None Detected  NONE DETECTED (Cut Off Level 100 ng/mL) Final   POC Marijuana UR 11/21/2023 None Detected  NONE DETECTED (Cut Off Level 50 ng/mL) Final   Vitamin B-12 11/21/2023 306  180 - 914 pg/mL Final   Comment: (NOTE) This assay is not validated for testing neonatal or myeloproliferative syndrome specimens for Vitamin B12 levels. Performed  at [facility], [address] [place] [address], [place], [place] [address]    T3, Free 11/21/2023 3.4  2.0 - 4.4 pg/mL Final   Comment: (NOTE) Performed At: [facility] [address] [place], [place] [id number] Pearlean Botts MD LK:[id number]    Free T4 11/21/2023 0.88  0.61 - 1.12 ng/dL Final   Comment: (NOTE) Biotin ingestion may interfere with free T4 tests. If the results are inconsistent with the TSH level, previous test results, or the clinical presentation, then consider biotin interference. If needed, order repeat testing after stopping biotin. Performed at [facility], [address], [place], [place] [address]    Preg Test, Ur 11/21/2023 NEGATIVE  NEGATIVE Final   Comment:        THE SENSITIVITY OF THIS METHODOLOGY IS >24 mIU/mL     Blood Alcohol level:  Lab Results  Component Value Date   ETH <10 11/21/2023   ETH <10 07/08/2023    Metabolic Disorder Labs: Lab Results  Component Value Date   HGBA1C 5.8 (H) 11/21/2023   MPG 120 11/21/2023   MPG 119.76 07/11/2023   Lab Results  Component Value Date   PROLACTIN 7.6 08/04/2022   Lab Results  Component Value Date   CHOL 205 (H) 11/21/2023   TRIG 99 11/21/2023   HDL 63 11/21/2023   CHOLHDL 3.3 11/21/2023   VLDL 20 11/21/2023   LDLCALC 122 (H) 11/21/2023   LDLCALC 110 (H) 05/20/2023    Therapeutic Lab Levels: No results found for: "LITHIUM" No results found for: "VALPROATE" No results found for: "CBMZ"  Physical Findings   AIMS    Flowsheet Row [facility]  AIMS Total Score 0      AUDIT    Flowsheet Row ED from 02/09/2024 in [facility] Admission (Discharged[facility] Admission (Discharged[facility]  Alcohol Use Disorder Identification Test Final Score (AUDIT) 0 0 2       PHQ2-9    Flowsheet Row ED from 07/09/2023 in [facility] ED from 07/08/2023 in [facility] ED from 08/04/2022 in [facility]  PHQ-2 Total Score 2 2 4   PHQ-9 Total Score 8 7 10       Flowsheet Row ED from 02/09/2024 in [facility] Most recent reading at 02/09/2024  4:07 PM ED from 02/09/2024 in [facility] Most recent reading at 02/09/2024  2:22 AM Admission (Discharged) [facility] Most recent reading at 11/21/2023  5:40 PM  C-SSRS RISK CATEGORY Moderate Risk Low Risk Low Risk        Musculoskeletal  Strength & Muscle Tone: within normal limits Gait & Station: normal Patient leans: N/A  Psychiatric Specialty Exam  Presentation General Appearance:  Appropriate for Environment   Eye Contact: Fleeting   Speech: Clear and Coherent   Speech Volume: Decreased     Mood and Affect  Mood: "Not good"   Affect: Congruent, constricted     Thought Process  Thought Processes: Linear   Descriptions of Associations:Intact   Orientation:Full (Time, Place and Person)   Thought Content:WDL  Diagnosis of Schizophrenia or Schizoaffective disorder in past: No   Hallucinations:Hallucinations: None   Ideas of Reference:None   Suicidal Thoughts:Suicidal Thoughts: Yes, Passive SI Passive Intent and/or Plan: Without Plan; Without Intent   Homicidal Thoughts:Homicidal Thoughts: No     Sensorium  Memory: Immediate Fair   Judgment: Poor   Insight: Fair     Chartered certified accountant: Fair   Attention Span: Fair   Recall: Good   Fund of Knowledge: Good   Language: Good     Psychomotor Activity  Psychomotor Activity: Psychomotor Activity: Normal     Assets  Assets: Desire for Improvement; Housing; Vocational/Educational     Sleep   Sleep:Poor   Nutritional Assessment (For OBS and FBC admissions only) Has the patient had a weight loss or gain of 10 pounds or more in the last 3 months?: No Has the patient had a decrease in food intake/or appetite?: No Does the patient have dental problems?: No Does the patient have eating habits or behaviors that may be indicators of an eating disorder including binging or inducing vomiting?: No Has the patient recently lost weight without trying?: 0 Has the patient been eating poorly because of a decreased appetite?: 0 Malnutrition Screening Tool Score: 0    Physical Exam   Facility Based Crisis Admission H&P  Physical Exam Vitals and nursing note reviewed.  Constitutional:  General: She is not in acute distress.    Appearance: She is not ill-appearing.  HENT:     Head: Normocephalic and atraumatic.  Pulmonary:     Effort: Pulmonary effort is normal. No respiratory distress.  Musculoskeletal:        General: Normal range of motion.  Skin:    General: Skin is warm and dry.  Comments: Laceration at the R gluteal region, nonsuppurative, nonpurulent    Review of Systems  Constitutional:  Negative for chills and fever.  HENT:  Negative for hearing loss and sore throat.   Eyes:  Negative for blurred vision.  Respiratory:  Negative for cough and shortness of breath.   Cardiovascular:  Negative for chest pain and palpitations.  Gastrointestinal:  Negative for nausea and vomiting.  Psychiatric/Behavioral:  Positive for depression, substance abuse and suicidal ideas. The patient is nervous/anxious.    Blood pressure 126/70, pulse 64, temperature 98.3 F (36.8 C), temperature source Oral, resp. rate 16, last menstrual period 11/18/2018, SpO2 100%. There is no height or weight on file to calculate BMI.  Treatment Plan Summary: Daily contact with patient to assess and evaluate symptoms and progress in treatment and Medication management  At this point in time patient does  not want to take any psychotropic medicine.  Will continue symptomatic treatment  HTN Restart home amlodipine    R gluteal laceration Wound care and neomycin  ointment   Other PRNs: acetaminophen , 650 mg, Q6H PRN alum & mag hydroxide-simeth, 30 mL, Q4H PRN haloperidol , 5 mg, TID PRN  And diphenhydrAMINE , 50 mg, TID PRN haloperidol  lactate, 5 mg, TID PRN  And diphenhydrAMINE , 50 mg, TID PRN  And LORazepam , 2 mg, TID PRN haloperidol  lactate, 10 mg, TID PRN  And diphenhydrAMINE , 50 mg, TID PRN  And LORazepam , 2 mg, TID PRN hydrOXYzine , 25 mg, TID PRN magnesium  hydroxide, 30 mL, Daily PRN traZODone , 50 mg, QHS PRN       Disposition: pt expressed interest in residential rehabilitation    Silas Drivers, MD

## 2024-02-11 DIAGNOSIS — F1410 Cocaine abuse, uncomplicated: Secondary | ICD-10-CM | POA: Diagnosis not present

## 2024-02-11 DIAGNOSIS — Z79899 Other long term (current) drug therapy: Secondary | ICD-10-CM | POA: Diagnosis not present

## 2024-02-11 DIAGNOSIS — F1510 Other stimulant abuse, uncomplicated: Secondary | ICD-10-CM | POA: Diagnosis not present

## 2024-02-11 DIAGNOSIS — I10 Essential (primary) hypertension: Secondary | ICD-10-CM | POA: Diagnosis not present

## 2024-02-11 NOTE — ED Notes (Signed)
 Patient A&Ox4. Denies intent to harm self/others when asked. Denies A/VH. Patient denies any physical complaints when asked. No acute distress noted. Support and encouragement provided. Routine safety checks conducted according to facility protocol. Encouraged patient to notify staff if thoughts of harm toward self or others arise. Patient verbalize understanding and agreement. Will continue to monitor for safety.

## 2024-02-11 NOTE — Group Note (Signed)
 Group Topic: Social Support  Group Date: 02/11/2024 Start Time: 1009 End Time: 1040 Facilitators: Pllc, Kellin; Ares Cardozo B, NT  Department: [facility]  Number of Participants: 8  Group Focus: relapse prevention and safety plan Treatment Modality:  Psychoeducation Interventions utilized were story telling and support Purpose: increase insight, relapse prevention strategies, and having SMART goals and a WRAP plan  Name: Alexandra Buckley Date of Birth: [birth year]  MR: [id number]    Level of Participation: PT refused to attend Quality of Participation: uncooperative Interactions with others: No interaction, pt didn't attend Mood/Affect: n/a Triggers (if applicable): n/a Cognition: coherent/clear Progress: Other Response: PT was encouraged to join group, PT refused Plan: patient will be encouraged to attend groups  Patients Problems:  Patient Active Problem List   Diagnosis Date Noted   Stimulant use disorder 02/09/2024   Prediabetes 08/10/2023   H/O: CVA (cerebrovascular accident) 07/11/2023   Substance induced mood disorder (HCC) 07/09/2023   Cocaine use 05/20/2023   Acute CVA (cerebrovascular accident) (HCC) 05/19/2023   History of trichomoniasis 10/08/2022   Recurrent major depressive disorder, in remission (HCC) 08/31/2022   Psychophysiological insomnia 08/31/2022   Essential hypertension 08/11/2022   Chronic left shoulder pain 08/11/2022   Chronic bilateral low back pain without sciatica 08/11/2022   Mixed hyperlipidemia 08/11/2022   History of suicide attempt 08/05/2022   Tobacco use disorder 08/05/2022   Cocaine abuse with cocaine-induced mood disorder (HCC) 08/04/2022   MDD (major depressive disorder), recurrent severe, without psychosis (HCC) 03/02/2017

## 2024-02-11 NOTE — Discharge Planning (Signed)
 SW received call from Troy at [facility] and patient will not be appropriate for a bed offer until her wound is healed. Will continue to send to other residential programs.

## 2024-02-11 NOTE — Group Note (Signed)
 Group Topic: Communication  Group Date: 02/11/2024 Start Time: 0900 End Time: 1000 Facilitators: Denzil Flatten, RN  Department: [facility]  Number of Participants: 9  Group Focus: other medication education Treatment Modality:  Individual Therapy Interventions utilized were patient education Purpose: increase insight  Name: Alexandra Buckley Date of Birth: [birth year]  MR: [id number]    Level of Participation: active Quality of Participation: attentive Interactions with others: gave feedback Mood/Affect: appropriate Triggers (if applicable): none identified Cognition: coherent/clear Progress: Gaining insight Response: pt verbalized understanding of all medication received during morning med pass Plan: patient will be encouraged to notify staff with any SE to medication administered  Patients Problems:  Patient Active Problem List   Diagnosis Date Noted   Stimulant use disorder 02/09/2024   Prediabetes 08/10/2023   H/O: CVA (cerebrovascular accident) 07/11/2023   Substance induced mood disorder (HCC) 07/09/2023   Cocaine use 05/20/2023   Acute CVA (cerebrovascular accident) (HCC) 05/19/2023   History of trichomoniasis 10/08/2022   Recurrent major depressive disorder, in remission (HCC) 08/31/2022   Psychophysiological insomnia 08/31/2022   Essential hypertension 08/11/2022   Chronic left shoulder pain 08/11/2022   Chronic bilateral low back pain without sciatica 08/11/2022   Mixed hyperlipidemia 08/11/2022   History of suicide attempt 08/05/2022   Tobacco use disorder 08/05/2022   Cocaine abuse with cocaine-induced mood disorder (HCC) 08/04/2022   MDD (major depressive disorder), recurrent severe, without psychosis (HCC) 03/02/2017

## 2024-02-11 NOTE — ED Notes (Signed)
 Pt laying in bed asleep but easily aroused to name being called. Denies concerns at present. RR even and unlabored. Environment secured. Will continue to monitor for safety.

## 2024-02-11 NOTE — Discharge Planning (Signed)
 SW sent clinicals to [facility] for referral. Will follow up with Moira Andrews on review and availability. Will continue to follow.

## 2024-02-11 NOTE — ED Notes (Signed)
 Patient is sleeping. Respirations equal and unlabored, skin warm and dry. No change in assessment or acuity. Routine safety checks conducted according to facility protocol. Will continue to monitor for safety.

## 2024-02-11 NOTE — Discharge Planning (Signed)
 SW spoke with RN to clarify if patient had a wound and she does not have a wound but a laceration that is closed and healed on buttox from a previous fall out of a car.This SW sent message to Kendrick at [facility] to make her aware of skin laceration. Will continue to follow.

## 2024-02-11 NOTE — ED Provider Notes (Signed)
 Behavioral Health Progress Note  Date and Time: 02/11/2024  Name: Alexandra Buckley MRN:  [id number]  Subjective: Chart reviewed, case discussed with staff, patient seen during rounds.  Patient continues to endorse depressed mood.  Not much change in mental status since yesterday.  Patient has been isolating herself to her room, she was encouraged to participate in milieu and attend group.   She denies psychotic or manic symptoms.  Patient was encouraged to work with Child psychotherapist on the unit and rehab options.  Diagnosis:  Final diagnoses:  Stimulant use disorder  Cocaine abuse (HCC)  Hypertension, unspecified type      Past Psychiatric History: See H&P Past Medical History: See H&P Family History: See H&P Family Psychiatric  History: See H&P Social History: See H&P             Sleep: Poor  Appetite:  Poor  Current Medications:  Current Facility-Administered Medications  Medication Dose Route Frequency Provider Last Rate Last Admin   acetaminophen  (TYLENOL ) tablet 650 mg  650 mg Oral Q6H PRN Carrion-Carrero, Margely, MD   650 mg at 02/10/24 1138   alum & mag hydroxide-simeth (MAALOX/MYLANTA) 200-200-20 MG/5ML suspension 30 mL  30 mL Oral Q4H PRN Carrion-Carrero, Jacalyn Martin, MD   30 mL at 02/10/24 1139   amLODipine  (NORVASC ) tablet 5 mg  5 mg Oral Daily Carrion-Carrero, Margely, MD   5 mg at 02/11/24 0920   cloNIDine  (CATAPRES ) tablet 0.1 mg  0.1 mg Oral Q6H PRN Carrion-Carrero, Jacalyn Martin, MD   0.1 mg at 02/10/24 2144   haloperidol  (HALDOL ) tablet 5 mg  5 mg Oral TID PRN Baltazar Bonier, MD       And   diphenhydrAMINE  (BENADRYL ) capsule 50 mg  50 mg Oral TID PRN Carrion-Carrero, Margely, MD       haloperidol  lactate (HALDOL ) injection 5 mg  5 mg Intramuscular TID PRN Carrion-Carrero, Margely, MD       And   diphenhydrAMINE  (BENADRYL ) injection 50 mg  50 mg Intramuscular TID PRN Carrion-Carrero, Margely, MD       And   LORazepam  (ATIVAN ) injection 2 mg  2 mg Intramuscular  TID PRN Carrion-Carrero, Margely, MD       haloperidol  lactate (HALDOL ) injection 10 mg  10 mg Intramuscular TID PRN Carrion-Carrero, Margely, MD       And   diphenhydrAMINE  (BENADRYL ) injection 50 mg  50 mg Intramuscular TID PRN Carrion-Carrero, Margely, MD       And   LORazepam  (ATIVAN ) injection 2 mg  2 mg Intramuscular TID PRN Carrion-Carrero, Jacalyn Martin, MD       hydrOXYzine  (ATARAX ) tablet 25 mg  25 mg Oral TID PRN Carrion-Carrero, Jacalyn Martin, MD   25 mg at 02/10/24 2144   magnesium  hydroxide (MILK OF MAGNESIA) suspension 30 mL  30 mL Oral Daily PRN Carrion-Carrero, Jacalyn Martin, MD       traZODone  (DESYREL ) tablet 50 mg  50 mg Oral QHS PRN Carrion-Carrero, Margely, MD   50 mg at 02/10/24 2144   Current Outpatient Medications  Medication Sig Dispense Refill   amLODipine  (NORVASC ) 5 MG tablet Take 1 tablet (5 mg total) by mouth daily.     aspirin  EC 81 MG tablet Take 1 tablet (81 mg total) by mouth at bedtime. Swallow whole. (Patient not taking: Reported on 02/09/2024) 30 tablet 0   atorvastatin  (LIPITOR) 40 MG tablet Take 1 tablet (40 mg total) by mouth at bedtime. (Patient not taking: Reported on 02/09/2024) 30 tablet 0   Vitamin D , Ergocalciferol , (DRISDOL ) 1.25 MG (  50000 UNIT) CAPS capsule Take 1 capsule (50,000 Units total) by mouth every 7 (seven) days. (Patient not taking: Reported on 02/09/2024) 5 capsule 0    Labs  Lab Results:  Admission on 02/09/2024, Discharged on 02/09/2024  Component Date Value Ref Range Status   Preg Test, Ur 02/09/2024 Negative  Negative Final   POC Amphetamine UR 02/09/2024 None Detected  NONE DETECTED (Cut Off Level 1000 ng/mL) Final   POC Secobarbital (BAR) 02/09/2024 None Detected  NONE DETECTED (Cut Off Level 300 ng/mL) Final   POC Buprenorphine (BUP) 02/09/2024 None Detected  NONE DETECTED (Cut Off Level 10 ng/mL) Final   POC Oxazepam (BZO) 02/09/2024 None Detected  NONE DETECTED (Cut Off Level 300 ng/mL) Final   POC Cocaine UR 02/09/2024 Positive (A)  NONE  DETECTED (Cut Off Level 300 ng/mL) Final   POC Methamphetamine UR 02/09/2024 None Detected  NONE DETECTED (Cut Off Level 1000 ng/mL) Final   POC Morphine 02/09/2024 None Detected  NONE DETECTED (Cut Off Level 300 ng/mL) Final   POC Methadone UR 02/09/2024 None Detected  NONE DETECTED (Cut Off Level 300 ng/mL) Final   POC Oxycodone UR 02/09/2024 None Detected  NONE DETECTED (Cut Off Level 100 ng/mL) Final   POC Marijuana UR 02/09/2024 Positive (A)  NONE DETECTED (Cut Off Level 50 ng/mL) Final  Admission on 11/21/2023, Discharged on 11/29/2023  Component Date Value Ref Range Status   Folate 11/24/2023 7.2  >5.9 ng/mL Final   Performed at [facility], [address], [place], [place] [address]   RPR Ser Ql 11/24/2023 NON REACTIVE  NON REACTIVE Final   Performed at [facility], [address], [place], [place] [address]   Vit D, 25-Hydroxy 11/24/2023 10.71 (L)  30 - 100 ng/mL Final   Comment: (NOTE) Vitamin D  deficiency has been defined by the Institute of Medicine  and an Endocrine Society practice guideline as a level of serum 25-OH  vitamin D  less than 20 ng/mL (1,2). The Endocrine Society went on to  further define vitamin D  insufficiency as a level between 21 and 29  ng/mL (2).  1. IOM (Institute of Medicine). 2010. Dietary reference intakes for  calcium  and D. [place]: [employer]. 2. Holick MF, Binkley [place], Bischoff-Ferrari HA, et al. Evaluation,  treatment, and prevention of vitamin D  deficiency: an Endocrine  Society clinical practice guideline, JCEM. 2011 Jul; 96(7): 1911-30.  Performed at [facility], [address], [place], [place] [address]    Sodium 11/24/2023 142  135 - 145 mmol/L Final   Potassium 11/24/2023 3.9  3.5 - 5.1 mmol/L Final   Chloride 11/24/2023 110  98 - 111 mmol/L Final   CO2 11/24/2023 26  22 - 32 mmol/L Final   Glucose, Bld 11/24/2023 81  70 - 99 mg/dL Final   Glucose reference range applies only to  samples taken after fasting for at least 8 hours.   BUN 11/24/2023 14  6 - 20 mg/dL Final   Creatinine, Ser 11/24/2023 0.62  0.44 - 1.00 mg/dL Final   Calcium  11/24/2023 9.0  8.9 - 10.3 mg/dL Final   GFR, Estimated 11/24/2023 >60  >60 mL/min Final   Comment: (NOTE) Calculated using the CKD-EPI Creatinine Equation (2021)    Anion gap 11/24/2023 6  5 - 15 Final   Performed at [facility], [address], [place], [place] [address]   Sed Rate 11/24/2023 11  0 - 22 mm/hr Final   Performed  at [facility], [address], [place], [place] [address]   CRP 11/24/2023 0.8  <1.0 mg/dL Final   Performed at [facility], [address], [place], [place] [address]   ANA Ab, IFA 11/24/2023 Negative   Final   Comment: (NOTE)                                     Negative   <1:80                                     Borderline  1:80                                     Positive   >1:80 ICAP nomenclature: AC-0 For more information about Hep-2 cell patterns use ANApatterns.org, the official website for the International Consensus on Antinuclear Antibody (ANA) Patterns (ICAP). Performed At: [facility] [address] [place], [place] [id number] Pearlean Botts MD WG:[id number]   Admission on 11/21/2023, Discharged on 11/21/2023  Component Date Value Ref Range Status   WBC 11/21/2023 6.5  4.0 - 10.5 K/uL Final   RBC 11/21/2023 4.91  3.87 - 5.11 MIL/uL Final   Hemoglobin 11/21/2023 14.0  12.0 - 15.0 g/dL Final   HCT 78/46/9629 44.1  36.0 - 46.0 % Final   MCV 11/21/2023 89.8  80.0 - 100.0 fL Final   MCH 11/21/2023 28.5  26.0 - 34.0 pg Final   MCHC 11/21/2023 31.7  30.0 - 36.0 g/dL Final   RDW 52/84/1324 14.0  11.5 - 15.5 % Final   Platelets 11/21/2023 259  150 - 400 K/uL Final   nRBC 11/21/2023 0.0  0.0 - 0.2 % Final   Neutrophils Relative % 11/21/2023 61  % Final   Neutro Abs 11/21/2023 4.0  1.7 - 7.7 K/uL Final   Lymphocytes Relative 11/21/2023  31  % Final   Lymphs Abs 11/21/2023 2.0  0.7 - 4.0 K/uL Final   Monocytes Relative 11/21/2023 6  % Final   Monocytes Absolute 11/21/2023 0.4  0.1 - 1.0 K/uL Final   Eosinophils Relative 11/21/2023 1  % Final   Eosinophils Absolute 11/21/2023 0.1  0.0 - 0.5 K/uL Final   Basophils Relative 11/21/2023 1  % Final   Basophils Absolute 11/21/2023 0.0  0.0 - 0.1 K/uL Final   Immature Granulocytes 11/21/2023 0  % Final   Abs Immature Granulocytes 11/21/2023 0.02  0.00 - 0.07 K/uL Final   Performed at [facility], [address] [place], [place] [address]   Sodium 11/21/2023 146 (H)  135 - 145 mmol/L Final   Potassium 11/21/2023 3.7  3.5 - 5.1 mmol/L Final   Chloride 11/21/2023 104  98 - 111 mmol/L Final   CO2 11/21/2023 29  22 - 32 mmol/L Final   Glucose, Bld 11/21/2023 96  70 - 99 mg/dL Final   Glucose reference range applies only to samples taken after fasting for at least 8 hours.   BUN 11/21/2023 13  6 - 20 mg/dL Final   Creatinine, Ser 11/21/2023 0.80  0.44 - 1.00 mg/dL Final   Calcium  11/21/2023 9.3  8.9 - 10.3 mg/dL Final   Total Protein 72/53/6644 6.6  6.5 - 8.1 g/dL Final  Albumin 11/21/2023 3.9  3.5 - 5.0 g/dL Final   AST 16/07/9603 17  15 - 41 U/L Final   ALT 11/21/2023 19  0 - 44 U/L Final   Alkaline Phosphatase 11/21/2023 80  38 - 126 U/L Final   Total Bilirubin 11/21/2023 0.4  0.0 - 1.2 mg/dL Final   GFR, Estimated 11/21/2023 >60  >60 mL/min Final   Comment: (NOTE) Calculated using the CKD-EPI Creatinine Equation (2021)    Anion gap 11/21/2023 13  5 - 15 Final   Performed at [facility], [address] [place], [place] [address]   Hgb A1c MFr Bld 11/21/2023 5.8 (H)  4.8 - 5.6 % Final   Comment: (NOTE)         Prediabetes: 5.7 - 6.4         Diabetes: >6.4         Glycemic control for adults with diabetes: <7.0    Mean Plasma Glucose 11/21/2023 120  mg/dL Final   Comment: (NOTE) Performed At: [facility] [address] [place], [place]  [id number] Pearlean Botts MD FA:[id number]    Magnesium  11/21/2023 2.2  1.7 - 2.4 mg/dL Final   Performed at [facility], [address] [place], [place] [address]   Alcohol, Ethyl (B) 11/21/2023 <10  <10 mg/dL Final   Comment: (NOTE) Lowest detectable limit for serum alcohol is 10 mg/dL.  For medical purposes only. Performed at [facility], [address], [place], [place] [address]    Cholesterol 11/21/2023 205 (H)  0 - 200 mg/dL Final   Triglycerides 32/44/0102 99  <150 mg/dL Final   HDL 72/53/6644 63  >40 mg/dL Final   Total CHOL/HDL Ratio 11/21/2023 3.3  RATIO Final   VLDL 11/21/2023 20  0 - 40 mg/dL Final   LDL Cholesterol 11/21/2023 122 (H)  0 - 99 mg/dL Final   Comment:        Total Cholesterol/HDL:CHD Risk Coronary Heart Disease Risk Table                     Men   Women  1/2 Average Risk   3.4   3.3  Average Risk       5.0   4.4  2 X Average Risk   9.6   7.1  3 X Average Risk  23.4   11.0        Use the calculated Patient Ratio above and the CHD Risk Table to determine the patient's CHD Risk.        ATP III CLASSIFICATION (LDL):  <100     mg/dL   Optimal  034-742  mg/dL   Near or Above                    Optimal  130-159  mg/dL   Borderline  595-638  mg/dL   High  >756     mg/dL   Very High Performed at [facility], [address], [place], [place] [address]    TSH 11/21/2023 0.723  0.350 - 4.500 uIU/mL Final   Comment: Performed by a 3rd Generation assay with a functional sensitivity of <=0.01 uIU/mL. Performed at [facility], [address], [place], [place] [address]    Preg Test, Ur 11/21/2023 Negative  Negative Final   Color, Urine 11/21/2023 AMBER (A)  YELLOW Final   BIOCHEMICALS MAY BE AFFECTED BY COLOR   APPearance 11/21/2023 CLOUDY (A)  CLEAR  Final   Specific Gravity, Urine 11/21/2023 1.021  1.005 - 1.030 Final   pH 11/21/2023 6.0  5.0 - 8.0 Final   Glucose, UA 11/21/2023 NEGATIVE  NEGATIVE mg/dL Final   Hgb urine  dipstick 11/21/2023 NEGATIVE  NEGATIVE Final   Bilirubin Urine 11/21/2023 NEGATIVE  NEGATIVE Final   Ketones, ur 11/21/2023 NEGATIVE  NEGATIVE mg/dL Final   Protein, ur 16/07/9603 30 (A)  NEGATIVE mg/dL Final   Nitrite 54/06/8118 NEGATIVE  NEGATIVE Final   Leukocytes,Ua 11/21/2023 SMALL (A)  NEGATIVE Final   RBC / HPF 11/21/2023 0-5  0 - 5 RBC/hpf Final   WBC, UA 11/21/2023 6-10  0 - 5 WBC/hpf Final   Bacteria, UA 11/21/2023 RARE (A)  NONE SEEN Final   Squamous Epithelial / HPF 11/21/2023 21-50  0 - 5 /HPF Final   Mucus 11/21/2023 PRESENT   Final   Performed at [facility], [address], [place], [place] [address]   POC Amphetamine UR 11/21/2023 None Detected  NONE DETECTED (Cut Off Level 1000 ng/mL) Final   POC Secobarbital (BAR) 11/21/2023 None Detected  NONE DETECTED (Cut Off Level 300 ng/mL) Final   POC Buprenorphine (BUP) 11/21/2023 None Detected  NONE DETECTED (Cut Off Level 10 ng/mL) Final   POC Oxazepam (BZO) 11/21/2023 None Detected  NONE DETECTED (Cut Off Level 300 ng/mL) Final   POC Cocaine UR 11/21/2023 Positive (A)  NONE DETECTED (Cut Off Level 300 ng/mL) Final   POC Methamphetamine UR 11/21/2023 None Detected  NONE DETECTED (Cut Off Level 1000 ng/mL) Final   POC Morphine 11/21/2023 None Detected  NONE DETECTED (Cut Off Level 300 ng/mL) Final   POC Methadone UR 11/21/2023 None Detected  NONE DETECTED (Cut Off Level 300 ng/mL) Final   POC Oxycodone UR 11/21/2023 None Detected  NONE DETECTED (Cut Off Level 100 ng/mL) Final   POC Marijuana UR 11/21/2023 None Detected  NONE DETECTED (Cut Off Level 50 ng/mL) Final   Vitamin B-12 11/21/2023 306  180 - 914 pg/mL Final   Comment: (NOTE) This assay is not validated for testing neonatal or myeloproliferative syndrome specimens for Vitamin B12 levels. Performed at [facility], [address], [place], [place] [address]    T3, Free 11/21/2023 3.4  2.0 - 4.4 pg/mL Final   Comment: (NOTE) Performed At: [facility] [address] [place], [place] [id number] Pearlean Botts MD NG:[id number]    Free T4 11/21/2023 0.88  0.61 - 1.12 ng/dL Final   Comment: (NOTE) Biotin ingestion may interfere with free T4 tests. If the results are inconsistent with the TSH level, previous test results, or the clinical presentation, then consider biotin interference. If needed, order repeat testing after stopping biotin. Performed at [facility], [address] [place], [place] [address]    Preg Test, Ur 11/21/2023 NEGATIVE  NEGATIVE Final   Comment:        THE SENSITIVITY OF THIS METHODOLOGY IS >24 mIU/mL     Blood Alcohol level:  Lab Results  Component Value Date   ETH <10 11/21/2023   ETH <10 07/08/2023    Metabolic Disorder Labs: Lab Results  Component Value Date   HGBA1C 5.8 (H) 11/21/2023   MPG 120 11/21/2023   MPG 119.76 07/11/2023   Lab Results  Component Value Date   PROLACTIN 7.6 08/04/2022   Lab Results  Component Value Date   CHOL 205 (H) 11/21/2023   TRIG 99 11/21/2023   HDL 63 11/21/2023   CHOLHDL 3.3 11/21/2023  VLDL 20 11/21/2023   LDLCALC 122 (H) 11/21/2023   LDLCALC 110 (H) 05/20/2023    Therapeutic Lab Levels: No results found for: "LITHIUM" No results found for: "VALPROATE" No results found for: "CBMZ"  Physical Findings   AIMS    Flowsheet Row [facility]  AIMS Total Score 0      AUDIT    Flowsheet Row ED from 02/09/2024 in [facility] Admission (Discharged) [facility] Ad[facility] ([facility]  Alcohol Use Disorder Identification Test Final Score (AUDIT) 0 0 2      PHQ2-9    Flowsheet Row ED from 07/09/2023 in [facility] ED from 07/08/2023 in [facility] ED from  08/04/2022 in [facility]  PHQ-2 Total Score 2 2 4   PHQ-9 Total Score 8 7 10       Flowsheet Row ED from 02/09/2024 in [facility] Most recent reading at 02/09/2024  4:07 PM ED from 02/09/2024 in [facility] Most recent reading at 02/09/2024  2:22 AM Admission (Discharged) [facility] Most recent reading at 11/21/2023  5:40 PM  C-SSRS RISK CATEGORY Moderate Risk Low Risk Low Risk        Musculoskeletal  Strength & Muscle Tone: within normal limits Gait & Station: normal Patient leans: N/A  Psychiatric Specialty Exam  Presentation General Appearance:  Appropriate for Environment   Eye Contact: Fleeting   Speech: Clear and Coherent   Speech Volume: Decreased     Mood and Affect  Mood: "Not good"   Affect: Congruent, constricted     Thought Process  Thought Processes: Linear   Descriptions of Associations:Intact   Orientation:Full (Time, Place and Person)   Thought Content:WDL  Diagnosis of Schizophrenia or Schizoaffective disorder in past: No   Hallucinations:Hallucinations: None   Ideas of Reference:None   Suicidal Thoughts:Suicidal Thoughts: Denies   Homicidal Thoughts:Homicidal Thoughts: No     Sensorium  Memory: Immediate Fair   Judgment: Poor   Insight: Fair     Chartered certified accountant: Fair   Attention Span: Fair   Recall: Good   Fund of Knowledge: Good   Language: Good     Psychomotor Activity  Psychomotor Activity: Psychomotor Activity: Normal     Assets  Assets: Desire for Improvement; Housing; Vocational/Educational     Sleep  Sleep:Fair      Physical Exam   Facility Based Crisis Admission H&P  Physical Exam Vitals and nursing note reviewed.  Constitutional:      General: She is not in acute distress.    Appearance: She is not ill-appearing.  HENT:      Head: Normocephalic and atraumatic.  Pulmonary:     Effort: Pulmonary effort is normal. No respiratory distress.  Musculoskeletal:        General: Normal range of motion.  Skin:    General: Skin is warm and dry.  Comments: Laceration at the R gluteal region, nonsuppurative, nonpurulent    Review of Systems  Constitutional:  Negative for chills and fever.  HENT:  Negative for hearing loss and sore throat.   Eyes:  Negative for blurred vision.  Respiratory:  Negative for cough and shortness of breath.   Cardiovascular:  Negative for chest pain and palpitations.  Gastrointestinal:  Negative  for nausea and vomiting.  Psychiatric/Behavioral:  Positive for depression and substance abuse. Negative for suicidal ideas. The patient is nervous/anxious.    Blood pressure 128/67, pulse 77, temperature 98.2 F (36.8 C), temperature source Oral, resp. rate 18, last menstrual period 11/18/2018, SpO2 100%. There is no height or weight on file to calculate BMI.  Treatment Plan Summary: Daily contact with patient to assess and evaluate symptoms and progress in treatment and Medication management  At this point in time patient does not want to take any psychotropic medicine.  Will continue symptomatic treatment  HTN Restart home amlodipine    R gluteal laceration Wound care and neomycin  ointment   Other PRNs: acetaminophen , 650 mg, Q6H PRN alum & mag hydroxide-simeth, 30 mL, Q4H PRN haloperidol , 5 mg, TID PRN  And diphenhydrAMINE , 50 mg, TID PRN haloperidol  lactate, 5 mg, TID PRN  And diphenhydrAMINE , 50 mg, TID PRN  And LORazepam , 2 mg, TID PRN haloperidol  lactate, 10 mg, TID PRN  And diphenhydrAMINE , 50 mg, TID PRN  And LORazepam , 2 mg, TID PRN hydrOXYzine , 25 mg, TID PRN magnesium  hydroxide, 30 mL, Daily PRN traZODone , 50 mg, QHS PRN       Disposition: pt expressed interest in residential rehabilitation    Silas Drivers, MD

## 2024-02-11 NOTE — ED Notes (Signed)
 Patient observed/assessed in patient room lying down asleep in bed. Patient aroused with verbal stimulation after several attempts to which patient responded in an agitated/annoyed demeanor. Patient oriented x 3. Affect is flat/agitated and eye contact is minimal. Patient evasive to preliminary assessment questions. Denies S/I and H/I. Denies A/V/H. Will continue to monitor for safety.

## 2024-02-11 NOTE — ED Notes (Signed)
 Pt sleeping in no acute distress. RR even and unlabored. Environment secured. Will continue to monitor for safety.

## 2024-02-12 DIAGNOSIS — F1410 Cocaine abuse, uncomplicated: Secondary | ICD-10-CM | POA: Diagnosis not present

## 2024-02-12 DIAGNOSIS — I10 Essential (primary) hypertension: Secondary | ICD-10-CM | POA: Diagnosis not present

## 2024-02-12 DIAGNOSIS — Z79899 Other long term (current) drug therapy: Secondary | ICD-10-CM | POA: Diagnosis not present

## 2024-02-12 DIAGNOSIS — F1510 Other stimulant abuse, uncomplicated: Secondary | ICD-10-CM | POA: Diagnosis not present

## 2024-02-12 LAB — LIPID PANEL
Cholesterol: 191 mg/dL (ref 0–200)
HDL: 56 mg/dL (ref >40)
LDL Cholesterol: 119 mg/dL — ABNORMAL HIGH (ref 0–99)
Total CHOL/HDL Ratio: 3.4 ratio
Triglycerides: 78 mg/dL (ref <150)
VLDL: 16 mg/dL (ref 0–40)

## 2024-02-12 LAB — COMPREHENSIVE METABOLIC PANEL WITH GFR
ALT: 19 U/L (ref 0–44)
AST: 16 U/L (ref 15–41)
Albumin: 3 g/dL — ABNORMAL LOW (ref 3.5–5.0)
Alkaline Phosphatase: 84 U/L (ref 38–126)
Anion gap: 10 (ref 5–15)
BUN: 13 mg/dL (ref 6–20)
CO2: 27 mmol/L (ref 22–32)
Calcium: 8.8 mg/dL — ABNORMAL LOW (ref 8.9–10.3)
Chloride: 106 mmol/L (ref 98–111)
Creatinine, Ser: 0.76 mg/dL (ref 0.44–1.00)
GFR, Estimated: >60 mL/min (ref >60)
Glucose, Bld: 78 mg/dL (ref 70–99)
Potassium: 3.9 mmol/L (ref 3.5–5.1)
Sodium: 143 mmol/L (ref 135–145)
Total Bilirubin: 0.3 mg/dL (ref 0.0–1.2)
Total Protein: 6.3 g/dL — ABNORMAL LOW (ref 6.5–8.1)

## 2024-02-12 LAB — CBC WITH DIFFERENTIAL/PLATELET
Abs Immature Granulocytes: 0.01 10*3/uL (ref 0.00–0.07)
Basophils Absolute: 0 10*3/uL (ref 0.0–0.1)
Basophils Relative: 0 %
Eosinophils Absolute: 0.1 10*3/uL (ref 0.0–0.5)
Eosinophils Relative: 1 %
HCT: 39.6 % (ref 36.0–46.0)
Hemoglobin: 12.8 g/dL (ref 12.0–15.0)
Immature Granulocytes: 0 %
Lymphocytes Relative: 52 %
Lymphs Abs: 2.6 10*3/uL (ref 0.7–4.0)
MCH: 29.1 pg (ref 26.0–34.0)
MCHC: 32.3 g/dL (ref 30.0–36.0)
MCV: 90 fL (ref 80.0–100.0)
Monocytes Absolute: 0.4 10*3/uL (ref 0.1–1.0)
Monocytes Relative: 7 %
Neutro Abs: 2.1 10*3/uL (ref 1.7–7.7)
Neutrophils Relative %: 40 %
Platelets: 265 10*3/uL (ref 150–400)
RBC: 4.4 MIL/uL (ref 3.87–5.11)
RDW: 13.6 % (ref 11.5–15.5)
WBC: 5.1 10*3/uL (ref 4.0–10.5)
nRBC: 0 % (ref 0.0–0.2)

## 2024-02-12 LAB — TSH: TSH: 1.979 u[IU]/mL (ref 0.350–4.500)

## 2024-02-12 NOTE — Group Note (Signed)
 Group Topic: Relaxation  Group Date: 02/12/2024 Start Time: 1540 End Time: 1618 Facilitators: Levent Kornegay, [place]oll, RN  Department: [facility]  Number of Participants: 4  Group Focus: check in, community group, daily focus, healthy friendships, personal responsibility, relaxation, social skills, and substance abuse education Treatment Modality:  Interpersonal Therapy Interventions utilized were group exercise and support Purpose: enhance coping skills, express feelings, and regain self-worth  Name: Alexandra Buckley Date of Birth: [birth year]  MR: [id number]    Level of Participation: did not attend group Quality of Participation:  Interactions with others:  Mood/Affect:  Triggers (if applicable):  Cognition:  Progress: None Response:  Plan: patient will be encouraged to attend future programming on the milieu  Patients Problems:  Patient Active Problem List   Diagnosis Date Noted   Stimulant use disorder 02/09/2024   Prediabetes 08/10/2023   H/O: CVA (cerebrovascular accident) 07/11/2023   Substance induced mood disorder (HCC) 07/09/2023   Cocaine use 05/20/2023   Acute CVA (cerebrovascular accident) (HCC) 05/19/2023   History of trichomoniasis 10/08/2022   Recurrent major depressive disorder, in remission (HCC) 08/31/2022   Psychophysiological insomnia 08/31/2022   Essential hypertension 08/11/2022   Chronic left shoulder pain 08/11/2022   Chronic bilateral low back pain without sciatica 08/11/2022   Mixed hyperlipidemia 08/11/2022   History of suicide attempt 08/05/2022   Tobacco use disorder 08/05/2022   Cocaine abuse with cocaine-induced mood disorder (HCC) 08/04/2022   MDD (major depressive disorder), recurrent severe, without psychosis (HCC) 03/02/2017

## 2024-02-12 NOTE — ED Notes (Signed)
 Patient provided breakfast

## 2024-02-12 NOTE — ED Notes (Signed)
 Patient resting with eyes closed in no apparent acute distress. Respirations even and unlabored. Environment secured. Safety checks in place according to facility policy.

## 2024-02-12 NOTE — Group Note (Signed)
 Group Topic: Relapse and Recovery  Group Date: 02/09/2024 Start Time: 1930 End Time: 2000 Facilitators: Renetta Carter, NT  Department: [facility]  Number of Participants: 4  Group Focus: affirmation Treatment Modality:  Individual Therapy Interventions utilized were group exercise Purpose: express feelings  Name: Sheriyah Cedano Date of Birth: [birth year]  MR: [id number]    Level of Participation: pt did not attend group Quality of Participation: pt did not attend group Interactions with others: pt did not attend group Mood/Affect: pt did not attend group Triggers (if applicable): pt did not attend group Cognition: pt did not attend group Progress: pt did not attend group Response: pt did not attend group Plan: pt did not attend group   Patients Problems:  Patient Active Problem List   Diagnosis Date Noted   Stimulant use disorder 02/09/2024   Prediabetes 08/10/2023   H/O: CVA (cerebrovascular accident) 07/11/2023   Substance induced mood disorder (HCC) 07/09/2023   Cocaine use 05/20/2023   Acute CVA (cerebrovascular accident) (HCC) 05/19/2023   History of trichomoniasis 10/08/2022   Recurrent major depressive disorder, in remission (HCC) 08/31/2022   Psychophysiological insomnia 08/31/2022   Essential hypertension 08/11/2022   Chronic left shoulder pain 08/11/2022   Chronic bilateral low back pain without sciatica 08/11/2022   Mixed hyperlipidemia 08/11/2022   History of suicide attempt 08/05/2022   Tobacco use disorder 08/05/2022   Cocaine abuse with cocaine-induced mood disorder (HCC) 08/04/2022   MDD (major depressive disorder), recurrent severe, without psychosis (HCC) 03/02/2017
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
## 2024-02-12 NOTE — ED Notes (Signed)
 Dressing on R buttock changed. Wound slightly malodorous with green discharge on dressing. Patient denies pain at site, endorses itching. Site cleansed with NS. 1"x8" petroleum dressing placed with two 2"x2" woven gauze sponge and one 4"x4" woven gauze sponge placed. Dressing secured with micropore tape. Wound appearance and dressing change reported off to Silas Drivers, MD. Environment secured, safety checks in place per facility policy.
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
## 2024-02-12 NOTE — ED Notes (Signed)
 Patient is sleeping. Respirations equal and unlabored, skin warm and dry. No change in assessment or acuity. Routine safety checks conducted according to facility protocol. Will continue to monitor for safety.

## 2024-02-12 NOTE — ED Notes (Addendum)
 Patient sitting in dayroom interacting with peers. No acute distress noted. No concerns voiced. Informed patient to notify staff with any needs or assistance. Patient verbalized understanding or agreement. Safety checks in place per facility policy.

## 2024-02-12 NOTE — ED Provider Notes (Signed)
 Behavioral Health Progress Note  Date and Time: 02/12/2024  Name: Alexandra Buckley MRN:  [id number]  Subjective: Chart reviewed, case discussed with staff, patient seen during rounds.  Patient continues to endorse depressed mood.  Not much change in mental status since yesterday.  Patient has been isolating herself to her room, she was encouraged to participate in milieu and attend group.   She denies psychotic or manic symptoms.  Patient was encouraged to work with Child psychotherapist on the unit and rehab options.  Patient has not completed her PHQ-9.  She was encouraged to complete PHQ-9 assessment.  Patient has a wound which is of concern.  At this time patient is denying any pain.  Her WBC count is normal.  She is not running fever.  Will continue to monitor closely.  Intern with internal medicine at [facility] health was consulted.  Apparently they do not consult on patient's admitted to [facility].  If patient's wound gets worse we will send the patient to the ER for evaluation.  This was discussed with RN  Diagnosis:  Final diagnoses:  Stimulant use disorder  Cocaine abuse (HCC)  Hypertension, unspecified type      Past Psychiatric History: See H&P Past Medical History: See H&P Family History: See H&P Family Psychiatric  History: See H&P Social History: See H&P             Sleep: Poor  Appetite:  Poor  Current Medications:  Current Facility-Administered Medications  Medication Dose Route Frequency Provider Last Rate Last Admin   acetaminophen  (TYLENOL ) tablet 650 mg  650 mg Oral Q6H PRN Carrion-Carrero, Margely, MD   650 mg at 02/10/24 1138   alum & mag hydroxide-simeth (MAALOX/MYLANTA) 200-200-20 MG/5ML suspension 30 mL  30 mL Oral Q4H PRN Carrion-Carrero, Jacalyn Martin, MD   30 mL at 02/10/24 1139   amLODipine  (NORVASC ) tablet 5 mg  5 mg Oral Daily Carrion-Carrero, Margely, MD   5 mg at 02/12/24 9147   cloNIDine  (CATAPRES ) tablet 0.1 mg  0.1 mg Oral Q6H PRN Carrion-Carrero, Jacalyn Martin, MD   0.1  mg at 02/10/24 2144   haloperidol  (HALDOL ) tablet 5 mg  5 mg Oral TID PRN Carrion-Carrero, Jacalyn Martin, MD       And   diphenhydrAMINE  (BENADRYL ) capsule 50 mg  50 mg Oral TID PRN Carrion-Carrero, Margely, MD       haloperidol  lactate (HALDOL ) injection 5 mg  5 mg Intramuscular TID PRN Carrion-Carrero, Margely, MD       And   diphenhydrAMINE  (BENADRYL ) injection 50 mg  50 mg Intramuscular TID PRN Carrion-Carrero, Margely, MD       And   LORazepam  (ATIVAN ) injection 2 mg  2 mg Intramuscular TID PRN Carrion-Carrero, Margely, MD       haloperidol  lactate (HALDOL ) injection 10 mg  10 mg Intramuscular TID PRN Carrion-Carrero, Margely, MD       And   diphenhydrAMINE  (BENADRYL ) injection 50 mg  50 mg Intramuscular TID PRN Carrion-Carrero, Margely, MD       And   LORazepam  (ATIVAN ) injection 2 mg  2 mg Intramuscular TID PRN Carrion-Carrero, Margely, MD       hydrOXYzine  (ATARAX ) tablet 25 mg  25 mg Oral TID PRN Carrion-Carrero, Margely, MD   25 mg at 02/10/24 2144   magnesium  hydroxide (MILK OF MAGNESIA) suspension 30 mL  30 mL Oral Daily PRN Carrion-Carrero, Margely, MD       traZODone  (DESYREL ) tablet 50 mg  50 mg Oral QHS PRN Baltazar Bonier, MD   50  mg at 02/10/24 2144   Current Outpatient Medications  Medication Sig Dispense Refill   amLODipine  (NORVASC ) 5 MG tablet Take 1 tablet (5 mg total) by mouth daily.     aspirin  EC 81 MG tablet Take 1 tablet (81 mg total) by mouth at bedtime. Swallow whole. (Patient not taking: Reported on 02/09/2024) 30 tablet 0   atorvastatin  (LIPITOR) 40 MG tablet Take 1 tablet (40 mg total) by mouth at bedtime. (Patient not taking: Reported on 02/09/2024) 30 tablet 0   Vitamin D , Ergocalciferol , (DRISDOL ) 1.25 MG (50000 UNIT) CAPS capsule Take 1 capsule (50,000 Units total) by mouth every 7 (seven) days. (Patient not taking: Reported on 02/09/2024) 5 capsule 0    Labs  Lab Results:  Admission on 02/09/2024  Component Date Value Ref Range Status   WBC 02/12/2024  5.1  4.0 - 10.5 K/uL Final   RBC 02/12/2024 4.40  3.87 - 5.11 MIL/uL Final   Hemoglobin 02/12/2024 12.8  12.0 - 15.0 g/dL Final   HCT 62/13/0865 39.6  36.0 - 46.0 % Final   MCV 02/12/2024 90.0  80.0 - 100.0 fL Final   MCH 02/12/2024 29.1  26.0 - 34.0 pg Final   MCHC 02/12/2024 32.3  30.0 - 36.0 g/dL Final   RDW 78/46/9629 13.6  11.5 - 15.5 % Final   Platelets 02/12/2024 265  150 - 400 K/uL Final   nRBC 02/12/2024 0.0  0.0 - 0.2 % Final   Neutrophils Relative % 02/12/2024 40  % Final   Neutro Abs 02/12/2024 2.1  1.7 - 7.7 K/uL Final   Lymphocytes Relative 02/12/2024 52  % Final   Lymphs Abs 02/12/2024 2.6  0.7 - 4.0 K/uL Final   Monocytes Relative 02/12/2024 7  % Final   Monocytes Absolute 02/12/2024 0.4  0.1 - 1.0 K/uL Final   Eosinophils Relative 02/12/2024 1  % Final   Eosinophils Absolute 02/12/2024 0.1  0.0 - 0.5 K/uL Final   Basophils Relative 02/12/2024 0  % Final   Basophils Absolute 02/12/2024 0.0  0.0 - 0.1 K/uL Final   Immature Granulocytes 02/12/2024 0  % Final   Abs Immature Granulocytes 02/12/2024 0.01  0.00 - 0.07 K/uL Final   Performed at [facility], [address], [place], [place] [address]   Sodium 02/12/2024 143  135 - 145 mmol/L Final   Potassium 02/12/2024 3.9  3.5 - 5.1 mmol/L Final   Chloride 02/12/2024 106  98 - 111 mmol/L Final   CO2 02/12/2024 27  22 - 32 mmol/L Final   Glucose, Bld 02/12/2024 78  70 - 99 mg/dL Final   Glucose reference range applies only to samples taken after fasting for at least 8 hours.   BUN 02/12/2024 13  6 - 20 mg/dL Final   Creatinine, Ser 02/12/2024 0.76  0.44 - 1.00 mg/dL Final   Calcium  02/12/2024 8.8 (L)  8.9 - 10.3 mg/dL Final   Total Protein 32/44/0102 6.3 (L)  6.5 - 8.1 g/dL Final   Albumin 72/53/6644 3.0 (L)  3.5 - 5.0 g/dL Final   AST 03/47/4259 16  15 - 41 U/L Final   ALT 02/12/2024 19  0 - 44 U/L Final   Alkaline Phosphatase 02/12/2024 84  38 - 126 U/L Final   Total Bilirubin 02/12/2024 0.3  0.0 - 1.2 mg/dL  Final   GFR, Estimated 02/12/2024 >60  >60 mL/min Final   Comment: (NOTE) Calculated using the CKD-EPI Creatinine Equation (2021)    Anion gap 02/12/2024 10  5 - 15 Final   Performed at [facility], [address] [place], [place], [place] [address]   TSH 02/12/2024 1.979  0.350 - 4.500 uIU/mL Final   Comment: Performed by a 3rd Generation assay with a functional sensitivity of <=0.01 uIU/mL. Performed at [facility], [address] [place], [place] [address]    Cholesterol 02/12/2024 191  0 - 200 mg/dL Final   Triglycerides 09/81/1914 78  <150 mg/dL Final   HDL 78/29/5621 56  >40 mg/dL Final   Total CHOL/HDL Ratio 02/12/2024 3.4  RATIO Final   VLDL 02/12/2024 16  0 - 40 mg/dL Final   LDL Cholesterol 02/12/2024 119 (H)  0 - 99 mg/dL Final   Comment:        Total Cholesterol/HDL:CHD Risk Coronary Heart Disease Risk Table                     Men   Women  1/2 Average Risk   3.4   3.3  Average Risk       5.0   4.4  2 X Average Risk   9.6   7.1  3 X Average Risk  23.4   11.0        Use the calculated Patient Ratio above and the CHD Risk Table to determine the patient's CHD Risk.        ATP III CLASSIFICATION (LDL):  <100     mg/dL   Optimal  308-657  mg/dL   Near or Above                    Optimal  130-159  mg/dL   Borderline  846-962  mg/dL   High  >952     mg/dL   Very High Performed at [facility], [address], [place], [place] [address]   Admission on 02/09/2024, Discharged on 02/09/2024  Component Date Value Ref Range Status   Preg Test, Ur 02/09/2024 Negative  Negative Final   POC Amphetamine UR 02/09/2024 None Detected  NONE DETECTED (Cut Off Level 1000 ng/mL) Final   POC Secobarbital (BAR) 02/09/2024 None Detected  NONE DETECTED (Cut Off Level 300 ng/mL) Final   POC Buprenorphine (BUP) 02/09/2024 None Detected  NONE DETECTED (Cut Off Level 10 ng/mL) Final   POC Oxazepam (BZO) 02/09/2024 None Detected  NONE DETECTED (Cut Off Level 300 ng/mL) Final    POC Cocaine UR 02/09/2024 Positive (A)  NONE DETECTED (Cut Off Level 300 ng/mL) Final   POC Methamphetamine UR 02/09/2024 None Detected  NONE DETECTED (Cut Off Level 1000 ng/mL) Final   POC Morphine 02/09/2024 None Detected  NONE DETECTED (Cut Off Level 300 ng/mL) Final   POC Methadone UR 02/09/2024 None Detected  NONE DETECTED (Cut Off Level 300 ng/mL) Final   POC Oxycodone UR 02/09/2024 None Detected  NONE DETECTED (Cut Off Level 100 ng/mL) Final   POC Marijuana UR 02/09/2024 Positive (A)  NONE DETECTED (Cut Off Level 50 ng/mL) Final  Admission on 11/21/2023, Discharged on 11/29/2023  Component Date Value Ref Range Status   Folate 11/24/2023 7.2  >5.9 ng/mL Final   Performed at [facility], [address] [place], [place] [address]   RPR Ser Ql 11/24/2023 NON REACTIVE  NON REACTIVE Final   Performed at [facility], [address] [place], [place] [address]   Vit D, 25-Hydroxy 11/24/2023 10.71 (L)  30 - 100 ng/mL Final   Comment: (NOTE) Vitamin  D deficiency has been defined by the Institute of Medicine  and an Endocrine Society practice guideline as a level of serum 25-OH  vitamin D  less than 20 ng/mL (1,2). The Endocrine Society went on to  further define vitamin D  insufficiency as a level between 21 and 29  ng/mL (2).  1. IOM (Institute of Medicine). 2010. Dietary reference intakes for  calcium  and D. [place]: [employer]. 2. Holick MF, Binkley [place], Bischoff-Ferrari HA, et al. Evaluation,  treatment, and prevention of vitamin D  deficiency: an Endocrine  Society clinical practice guideline, JCEM. 2011 Jul; 96(7): 1911-30.  Performed at [facility], [address] [place], [place] [address]    Sodium 11/24/2023 142  135 - 145 mmol/L Final   Potassium 11/24/2023 3.9  3.5 - 5.1 mmol/L Final   Chloride 11/24/2023 110  98 - 111 mmol/L Final   CO2 11/24/2023 26  22 - 32 mmol/L Final   Glucose, Bld 11/24/2023 81  70 - 99 mg/dL  Final   Glucose reference range applies only to samples taken after fasting for at least 8 hours.   BUN 11/24/2023 14  6 - 20 mg/dL Final   Creatinine, Ser 11/24/2023 0.62  0.44 - 1.00 mg/dL Final   Calcium  11/24/2023 9.0  8.9 - 10.3 mg/dL Final   GFR, Estimated 11/24/2023 >60  >60 mL/min Final   Comment: (NOTE) Calculated using the CKD-EPI Creatinine Equation (2021)    Anion gap 11/24/2023 6  5 - 15 Final   Performed at [facility], [address] [place], [place] [address]   Sed Rate 11/24/2023 11  0 - 22 mm/hr Final   Performed at [facility], [address] [place], [place] [address]   CRP 11/24/2023 0.8  <1.0 mg/dL Final   Performed at [facility], [address], [place], [place] [address]   ANA Ab, IFA 11/24/2023 Negative   Final   Comment: (NOTE)                                     Negative   <1:80                                     Borderline  1:80                                     Positive   >1:80 ICAP nomenclature: AC-0 For more information about Hep-2 cell patterns use ANApatterns.org, the official website for the International Consensus on Antinuclear Antibody (ANA) Patterns (ICAP). Performed At: [facility] [address] [place], [place] [id number] Pearlean Botts MD [id number]   Admission on 11/21/2023, Discharged on 11/21/2023  Component Date Value Ref Range Status   WBC 11/21/2023 6.5  4.0 - 10.5 K/uL Final   RBC 11/21/2023 4.91  3.87 - 5.11 MIL/uL Final   Hemoglobin 11/21/2023 14.0  12.0 - 15.0 g/dL Final   HCT 84/13/2440 44.1  36.0 - 46.0 % Final   MCV 11/21/2023 89.8  80.0 - 100.0 fL Final   MCH 11/21/2023 28.5  26.0 - 34.0 pg Final   MCHC 11/21/2023 31.7  30.0 - 36.0 g/dL Final   RDW 08/01/2535 14.0  11.5 - 15.5 %  Final   Platelets 11/21/2023 259  150 - 400 K/uL Final   nRBC 11/21/2023 0.0  0.0 - 0.2 % Final   Neutrophils Relative % 11/21/2023 61  % Final   Neutro Abs 11/21/2023 4.0  1.7 -  7.7 K/uL Final   Lymphocytes Relative 11/21/2023 31  % Final   Lymphs Abs 11/21/2023 2.0  0.7 - 4.0 K/uL Final   Monocytes Relative 11/21/2023 6  % Final   Monocytes Absolute 11/21/2023 0.4  0.1 - 1.0 K/uL Final   Eosinophils Relative 11/21/2023 1  % Final   Eosinophils Absolute 11/21/2023 0.1  0.0 - 0.5 K/uL Final   Basophils Relative 11/21/2023 1  % Final   Basophils Absolute 11/21/2023 0.0  0.0 - 0.1 K/uL Final   Immature Granulocytes 11/21/2023 0  % Final   Abs Immature Granulocytes 11/21/2023 0.02  0.00 - 0.07 K/uL Final   Performed at [facility], [address] [place], [place] [address]   Sodium 11/21/2023 146 (H)  135 - 145 mmol/L Final   Potassium 11/21/2023 3.7  3.5 - 5.1 mmol/L Final   Chloride 11/21/2023 104  98 - 111 mmol/L Final   CO2 11/21/2023 29  22 - 32 mmol/L Final   Glucose, Bld 11/21/2023 96  70 - 99 mg/dL Final   Glucose reference range applies only to samples taken after fasting for at least 8 hours.   BUN 11/21/2023 13  6 - 20 mg/dL Final   Creatinine, Ser 11/21/2023 0.80  0.44 - 1.00 mg/dL Final   Calcium  11/21/2023 9.3  8.9 - 10.3 mg/dL Final   Total Protein 13/05/6577 6.6  6.5 - 8.1 g/dL Final   Albumin 46/96/2952 3.9  3.5 - 5.0 g/dL Final   AST 84/13/2440 17  15 - 41 U/L Final   ALT 11/21/2023 19  0 - 44 U/L Final   Alkaline Phosphatase 11/21/2023 80  38 - 126 U/L Final   Total Bilirubin 11/21/2023 0.4  0.0 - 1.2 mg/dL Final   GFR, Estimated 11/21/2023 >60  >60 mL/min Final   Comment: (NOTE) Calculated using the CKD-EPI Creatinine Equation (2021)    Anion gap 11/21/2023 13  5 - 15 Final   Performed at [facility], [address], [place], [place] [address]   Hgb A1c MFr Bld 11/21/2023 5.8 (H)  4.8 - 5.6 % Final   Comment: (NOTE)         Prediabetes: 5.7 - 6.4         Diabetes: >6.4         Glycemic control for adults with diabetes: <7.0    Mean Plasma Glucose 11/21/2023 120  mg/dL Final   Comment: (NOTE) Performed At: [facility] [address] [place], [place] [id number] Pearlean Botts MD [id number]    Magnesium  11/21/2023 2.2  1.7 - 2.4 mg/dL Final   Performed at [facility], [address], [place], [place] [address]   Alcohol, Ethyl (B) 11/21/2023 <10  <10 mg/dL Final   Comment: (NOTE) Lowest detectable limit for serum alcohol is 10 mg/dL.  For medical purposes only. Performed at [facility], [address], [place], [place] [address]    Cholesterol 11/21/2023 205 (H)  0 - 200 mg/dL Final   Triglycerides 16/60/6301 99  <150 mg/dL Final   HDL 60/07/9322 63  >40 mg/dL Final   Total CHOL/HDL Ratio 11/21/2023 3.3  RATIO Final   VLDL 11/21/2023 20  0 - 40 mg/dL Final  LDL Cholesterol 11/21/2023 122 (H)  0 - 99 mg/dL Final   Comment:        Total Cholesterol/HDL:CHD Risk Coronary Heart Disease Risk Table                     Men   Women  1/2 Average Risk   3.4   3.3  Average Risk       5.0   4.4  2 X Average Risk   9.6   7.1  3 X Average Risk  23.4   11.0        Use the calculated Patient Ratio above and the CHD Risk Table to determine the patient's CHD Risk.        ATP III CLASSIFICATION (LDL):  <100     mg/dL   Optimal  914-782  mg/dL   Near or Above                    Optimal  130-159  mg/dL   Borderline  956-213  mg/dL   High  >086     mg/dL   Very High Performed at [facility], [address], [place], [place] [address]    TSH 11/21/2023 0.723  0.350 - 4.500 uIU/mL Final   Comment: Performed by a 3rd Generation assay with a functional sensitivity of <=0.01 uIU/mL. Performed at [facility], [address], [place], [place] [address]    Preg Test, Ur 11/21/2023 Negative  Negative Final   Color, Urine 11/21/2023 AMBER (A)  YELLOW Final   BIOCHEMICALS MAY BE AFFECTED BY COLOR   APPearance 11/21/2023 CLOUDY (A)  CLEAR Final   Specific Gravity, Urine 11/21/2023 1.021  1.005 - 1.030 Final   pH 11/21/2023 6.0  5.0 - 8.0 Final   Glucose, UA 11/21/2023  NEGATIVE  NEGATIVE mg/dL Final   Hgb urine dipstick 11/21/2023 NEGATIVE  NEGATIVE Final   Bilirubin Urine 11/21/2023 NEGATIVE  NEGATIVE Final   Ketones, ur 11/21/2023 NEGATIVE  NEGATIVE mg/dL Final   Protein, ur 28/41/3244 30 (A)  NEGATIVE mg/dL Final   Nitrite 10/07/7251 NEGATIVE  NEGATIVE Final   Leukocytes,Ua 11/21/2023 SMALL (A)  NEGATIVE Final   RBC / HPF 11/21/2023 0-5  0 - 5 RBC/hpf Final   WBC, UA 11/21/2023 6-10  0 - 5 WBC/hpf Final   Bacteria, UA 11/21/2023 RARE (A)  NONE SEEN Final   Squamous Epithelial / HPF 11/21/2023 21-50  0 - 5 /HPF Final   Mucus 11/21/2023 PRESENT   Final   Performed at [facility], [address] [place], [place] [address]   POC Amphetamine UR 11/21/2023 None Detected  NONE DETECTED (Cut Off Level 1000 ng/mL) Final   POC Secobarbital (BAR) 11/21/2023 None Detected  NONE DETECTED (Cut Off Level 300 ng/mL) Final   POC Buprenorphine (BUP) 11/21/2023 None Detected  NONE DETECTED (Cut Off Level 10 ng/mL) Final   POC Oxazepam (BZO) 11/21/2023 None Detected  NONE DETECTED (Cut Off Level 300 ng/mL) Final   POC Cocaine UR 11/21/2023 Positive (A)  NONE DETECTED (Cut Off Level 300 ng/mL) Final   POC Methamphetamine UR 11/21/2023 None Detected  NONE DETECTED (Cut Off Level 1000 ng/mL) Final   POC Morphine 11/21/2023 None Detected  NONE DETECTED (Cut Off Level 300 ng/mL) Final   POC Methadone UR 11/21/2023 None Detected  NONE DETECTED (Cut Off Level 300 ng/mL) Final   POC Oxycodone UR 11/21/2023 None Detected  NONE DETECTED (Cut Off Level  100 ng/mL) Final   POC Marijuana UR 11/21/2023 None Detected  NONE DETECTED (Cut Off Level 50 ng/mL) Final   Vitamin B-12 11/21/2023 306  180 - 914 pg/mL Final   Comment: (NOTE) This assay is not validated for testing neonatal or myeloproliferative syndrome specimens for Vitamin B12 levels. Performed at [facility], [address] [place], [place] [address]    T3, Free 11/21/2023 3.4  2.0 - 4.4 pg/mL Final    Comment: (NOTE) Performed At: [facility] [address] [place], [place] [id number] Pearlean Botts MD [id number]    Free T4 11/21/2023 0.88  0.61 - 1.12 ng/dL Final   Comment: (NOTE) Biotin ingestion may interfere with free T4 tests. If the results are inconsistent with the TSH level, previous test results, or the clinical presentation, then consider biotin interference. If needed, order repeat testing after stopping biotin. Performed at [facility], [address], [place], [place] [address]    Preg Test, Ur 11/21/2023 NEGATIVE  NEGATIVE Final   Comment:        THE SENSITIVITY OF THIS METHODOLOGY IS >24 mIU/mL     Blood Alcohol level:  Lab Results  Component Value Date   ETH <10 11/21/2023   ETH <10 07/08/2023    Metabolic Disorder Labs: Lab Results  Component Value Date   HGBA1C 5.8 (H) 11/21/2023   MPG 120 11/21/2023   MPG 119.76 07/11/2023   Lab Results  Component Value Date   PROLACTIN 7.6 08/04/2022   Lab Results  Component Value Date   CHOL 191 02/12/2024   TRIG 78 02/12/2024   HDL 56 02/12/2024   CHOLHDL 3.4 02/12/2024   VLDL 16 02/12/2024   LDLCALC 119 (H) 02/12/2024   LDLCALC 122 (H) 11/21/2023    Therapeutic Lab Levels: No results found for: "LITHIUM" No results found for: "VALPROATE" No results found for: "CBMZ"  Physical Findings   AIMS    Flowsheet Row [facility]  AIMS Total Score 0      AUDIT    Flowsheet Row ED from 02/09/2024 in [facility] Admission (Discharged) [facility] Ad[facility] (Discharged[facility]  Alcohol Use Disorder Identification Test Final Score (AUDIT) 0 0 2      PHQ2-9    Flowsheet Row ED from 07/09/2023 in [facility] ED from 07/08/2023 in [facility] ED from 08/04/2022 in [facility]  PHQ-2 Total Score 2 2 4   PHQ-9 Total Score 8 7 10       Flowsheet Row ED from 02/09/2024 in [facility] Most recent reading at 02/09/2024  4:07 PM ED from 02/09/2024 in [facility] Most recent reading at 02/09/2024  2:22 AM Admission (Discharged) [facility] Most recent reading at 11/21/2023  5:40 PM  C-SSRS RISK CATEGORY Moderate Risk Low Risk Low Risk        Musculoskeletal  Strength & Muscle Tone: within normal limits Gait & Station: normal Patient leans: N/A  Psychiatric Specialty Exam  Presentation General Appearance:  Appropriate for Environment   Eye Contact: Fleeting   Speech: Clear and Coherent   Speech Volume: Decreased     Mood and Affect  Mood: "Not good"   Affect: Congruent, constricted     Thought  Process  Thought Processes: Linear   Descriptions of Associations:Intact   Orientation:Full (Time, Place and Person)   Thought Content:WDL  Diagnosis of Schizophrenia or Schizoaffective disorder in past: No   Hallucinations:Hallucinations: None   Ideas of Reference:None   Suicidal Thoughts:Suicidal Thoughts: Denies   Homicidal Thoughts:Homicidal Thoughts: No     Sensorium  Memory: Immediate Fair   Judgment: Poor   Insight: Fair     Art therapist  Concentration: Fair   Attention Span: Fair   Recall: Good   Fund of Knowledge: Good   Language: Good     Psychomotor Activity  Psychomotor Activity: Psychomotor Activity: Normal     Assets  Assets: Desire for Improvement; Housing; Vocational/Educational     Sleep  Sleep:Fair      Physical Exam   Facility Based Crisis Admission H&P  Physical Exam Vitals and nursing note reviewed.  Constitutional:      General: She is not in acute distress.    Appearance: She is  not ill-appearing.  HENT:     Head: Normocephalic and atraumatic.  Pulmonary:     Effort: Pulmonary effort is normal. No respiratory distress.  Musculoskeletal:        General: Normal range of motion.  Skin:    General: Skin is warm and dry.  Comments: Laceration at the R gluteal region, nonsuppurative, nonpurulent    Review of Systems  Constitutional:  Negative for chills and fever.  HENT:  Negative for hearing loss and sore throat.   Eyes:  Negative for blurred vision.  Respiratory:  Negative for cough and shortness of breath.   Cardiovascular:  Negative for chest pain and palpitations.  Gastrointestinal:  Negative for nausea and vomiting.  Psychiatric/Behavioral:  Positive for depression and substance abuse. Negative for suicidal ideas. The patient is nervous/anxious.    Blood pressure 122/70, pulse 75, temperature 98.5 F (36.9 C), resp. rate 18, last menstrual period 11/18/2018, SpO2 100%. There is no height or weight on file to calculate BMI.  Treatment Plan Summary: Daily contact with patient to assess and evaluate symptoms and progress in treatment and Medication management  At this point in time patient does not want to take any psychotropic medicine.  Will continue symptomatic treatment  HTN Restart home amlodipine    R gluteal laceration Wound care and neomycin  ointment   Other PRNs: acetaminophen , 650 mg, Q6H PRN alum & mag hydroxide-simeth, 30 mL, Q4H PRN haloperidol , 5 mg, TID PRN  And diphenhydrAMINE , 50 mg, TID PRN haloperidol  lactate, 5 mg, TID PRN  And diphenhydrAMINE , 50 mg, TID PRN  And LORazepam , 2 mg, TID PRN haloperidol  lactate, 10 mg, TID PRN  And diphenhydrAMINE , 50 mg, TID PRN  And LORazepam , 2 mg, TID PRN hydrOXYzine , 25 mg, TID PRN magnesium  hydroxide, 30 mL, Daily PRN traZODone , 50 mg, QHS PRN       Disposition: pt expressed interest in residential rehabilitation    Silas Drivers, MD

## 2024-02-12 NOTE — ED Notes (Signed)
 Patient sitting in dayroom interacting with peers. No acute distress noted. No concerns voiced. Informed patient to notify staff with any needs or assistance. Patient verbalized understanding or agreement. Safety checks in place per facility policy.

## 2024-02-13 DIAGNOSIS — I10 Essential (primary) hypertension: Secondary | ICD-10-CM | POA: Diagnosis not present

## 2024-02-13 DIAGNOSIS — F1510 Other stimulant abuse, uncomplicated: Secondary | ICD-10-CM | POA: Diagnosis not present

## 2024-02-13 DIAGNOSIS — Z79899 Other long term (current) drug therapy: Secondary | ICD-10-CM | POA: Diagnosis not present

## 2024-02-13 DIAGNOSIS — F1410 Cocaine abuse, uncomplicated: Secondary | ICD-10-CM | POA: Diagnosis not present

## 2024-02-13 MED ORDER — GUAIFENESIN-DM 100-10 MG/5ML PO SYRP
10.0000 mL | ORAL_SOLUTION | ORAL | Status: DC | PRN
  Administered 2024-02-13: 10 mL via ORAL
  Filled 2024-02-13: qty 10

## 2024-02-13 NOTE — Group Note (Signed)
 Group Topic: Positive Affirmations  Group Date: 02/13/2024 Start Time: 2000 End Time: 2045 Facilitators: Renetta Carter, NT  Department: [facility]  Number of Participants: 6  Group Focus: affirmation Treatment Modality:  Solution-Focused Therapy Interventions utilized were mental fitness Purpose: regain self-worth  Name: Alexandra Buckley Date of Birth: [birth year]  MR: [id number]    Level of Participation: pt did not attend group Quality of Participation: pt did not attend group Interactions with others: pt did not attend group Mood/Affect: pt did not attend group Triggers (if applicable): pt did not attend group Cognition: pt did not attend group Progress: pt did not attend group Response: pt did not attend group Plan: pt did not attend group  Patients Problems:  Patient Active Problem List   Diagnosis Date Noted   Stimulant use disorder 02/09/2024   Prediabetes 08/10/2023   H/O: CVA (cerebrovascular accident) 07/11/2023   Substance induced mood disorder (HCC) 07/09/2023   Cocaine use 05/20/2023   Acute CVA (cerebrovascular accident) (HCC) 05/19/2023   History of trichomoniasis 10/08/2022   Recurrent major depressive disorder, in remission (HCC) 08/31/2022   Psychophysiological insomnia 08/31/2022   Essential hypertension 08/11/2022   Chronic left shoulder pain 08/11/2022   Chronic bilateral low back pain without sciatica 08/11/2022   Mixed hyperlipidemia 08/11/2022   History of suicide attempt 08/05/2022   Tobacco use disorder 08/05/2022   Cocaine abuse with cocaine-induced mood disorder (HCC) 08/04/2022   MDD (major depressive disorder), recurrent severe, without psychosis (HCC) 03/02/2017

## 2024-02-13 NOTE — ED Notes (Signed)
 Patient received Robitussin DM and acetaminophen  650 mg for c/o cough and (R) flank discomfort 7/10.

## 2024-02-13 NOTE — ED Notes (Addendum)
 Patient is in the bedroom calm and sleeping. NAD. Denies SI/HI/AVH.Respirations even and unlabored.  Will  continue to monitor for safety

## 2024-02-13 NOTE — ED Notes (Addendum)
 Patient A&O x 4, calm and cooperative. Patient with increased engagement in self-directed and group activities today. Patient denies SI, HI, AVH. Patient c/o (R) flank discomfort with centralized chest discomfort this a.m. Patient observed to have a large round, firm abdomen. milk of magnesia administered. Patient states she had a effective B.M but continues to c/o chest discomfort citing increased discomfort when coughing. Patient states she has been expectorating light yellow mucus. Breath-sounds clear A/P/L on auscultation.The provider ws made aware of patients complaint, states he will address.

## 2024-02-13 NOTE — Group Note (Signed)
 Group Topic: Overcoming Obstacles  Group Date: 02/13/2024 Start Time: 1245 End Time: 1315 Facilitators: Ardie Kras L, RN  Department: [facility]  Number of Participants: 11  Group Focus: affirmation, check in, clarity of thought, coping skills, daily focus, feeling awareness/expression, goals/reality orientation, nursing group, personal responsibility, self-awareness, and self-esteem Treatment Modality:  Psychoeducation Interventions utilized were group exercise, mental fitness, story telling, and support Purpose: enhance coping skills, express feelings, express irrational fears, increase insight, regain self-worth, and reinforce self-care  Name: Alexandra Buckley Date of Birth: Aug 19, 1971  MR: [id number]    Level of Participation: active Quality of Participation: attentive, cooperative, engaged, motivated, and offered feedback Interactions with others: gave feedback Mood/Affect: appropriate, brightens with interaction, and positive Triggers (if applicable): n/a Cognition: coherent/clear, goal directed, insightful, and logical Progress: Gaining insight Response: Patient gave positive feedback, expressed that people should be honest with themselves to have a positive change in thought. Patient shared her story of substance use  and sobriety. Patient also stated she is aware of her fault and "messed up" because she wanted to. Patient states she is tired of the repeated cycle and is ready for a positive change in lifestyle. Plan: follow-up needed  Patients Problems:  Patient Active Problem List   Diagnosis Date Noted   Stimulant use disorder 02/09/2024   Prediabetes 08/10/2023   H/O: CVA (cerebrovascular accident) 07/11/2023   Substance induced mood disorder (HCC) 07/09/2023   Cocaine use 05/20/2023   Acute CVA (cerebrovascular accident) (HCC) 05/19/2023   History of trichomoniasis 10/08/2022   Recurrent major depressive disorder, in remission (HCC)  08/31/2022   Psychophysiological insomnia 08/31/2022   Essential hypertension 08/11/2022   Chronic left shoulder pain 08/11/2022   Chronic bilateral low back pain without sciatica 08/11/2022   Mixed hyperlipidemia 08/11/2022   History of suicide attempt 08/05/2022   Tobacco use disorder 08/05/2022   Cocaine abuse with cocaine-induced mood disorder (HCC) 08/04/2022   MDD (major depressive disorder), recurrent severe, without psychosis (HCC) 03/02/2017

## 2024-02-13 NOTE — Group Note (Signed)
 Group Topic: Healthy Self Image and Positive Change  Group Date: 02/13/2024 Start Time: 1115 End Time: 1200 Facilitators: Emelia Hang  Department: [facility]  Number of Participants: 11  Group Focus: check in Treatment Modality:  Psychoeducation Interventions utilized were support Purpose: express feelings  Name: Alexandra Buckley Date of Birth: Feb 15, 1971  MR: [id number]    Level of Participation: active Quality of Participation: cooperative Interactions with others: gave feedback Mood/Affect: appropriate Triggers (if applicable): NA Cognition: coherent/clear Progress: Significant Response: Pt shares that she is determined. Pt states that her long term goal is to live with her daughter and show her what its like to take care of someone. Pt states that her short term goal is to find 90 treatment and build a routine of self-care.  Plan: follow-up needed  Patients Problems:  Patient Active Problem List   Diagnosis Date Noted   Stimulant use disorder 02/09/2024   Prediabetes 08/10/2023   H/O: CVA (cerebrovascular accident) 07/11/2023   Substance induced mood disorder (HCC) 07/09/2023   Cocaine use 05/20/2023   Acute CVA (cerebrovascular accident) (HCC) 05/19/2023   History of trichomoniasis 10/08/2022   Recurrent major depressive disorder, in remission (HCC) 08/31/2022   Psychophysiological insomnia 08/31/2022   Essential hypertension 08/11/2022   Chronic left shoulder pain 08/11/2022   Chronic bilateral low back pain without sciatica 08/11/2022   Mixed hyperlipidemia 08/11/2022   History of suicide attempt 08/05/2022   Tobacco use disorder 08/05/2022   Cocaine abuse with cocaine-induced mood disorder (HCC) 08/04/2022   MDD (major depressive disorder), recurrent severe, without psychosis (HCC) 03/02/2017

## 2024-02-13 NOTE — Group Note (Signed)
 Group Topic: Relapse and Recovery  Group Date: 02/12/2024 Start Time: 2000 End Time: 2100 Facilitators: Renetta Carter, NT  Department: [facility]  Number of Participants: 7  Group Focus: coping skills Treatment Modality:  Individual Therapy Interventions utilized were group exercise Purpose: enhance coping skills  Name: Alexandra Buckley Date of Birth: 01-06-1971  MR: [id number]    Level of Participation: pt did not attend group Quality of Participation: pt did not attend group Interactions with others: pt did not attend group Mood/Affect: pt did not attend group Triggers (if applicable): pt did not attend group Cognition:pt did not attend group  Progress: pt did not attend group Response: pt did not attend group Plan: pt did not attend group  Patients Problems:  Patient Active Problem List   Diagnosis Date Noted   Stimulant use disorder 02/09/2024   Prediabetes 08/10/2023   H/O: CVA (cerebrovascular accident) 07/11/2023   Substance induced mood disorder (HCC) 07/09/2023   Cocaine use 05/20/2023   Acute CVA (cerebrovascular accident) (HCC) 05/19/2023   History of trichomoniasis 10/08/2022   Recurrent major depressive disorder, in remission (HCC) 08/31/2022   Psychophysiological insomnia 08/31/2022   Essential hypertension 08/11/2022   Chronic left shoulder pain 08/11/2022   Chronic bilateral low back pain without sciatica 08/11/2022   Mixed hyperlipidemia 08/11/2022   History of suicide attempt 08/05/2022   Tobacco use disorder 08/05/2022   Cocaine abuse with cocaine-induced mood disorder (HCC) 08/04/2022   MDD (major depressive disorder), recurrent severe, without psychosis (HCC) 03/02/2017

## 2024-02-13 NOTE — ED Provider Notes (Signed)
 Behavioral Health Progress Note  Date and Time: 02/13/2024  Name: Alexandra Buckley MRN:  [id number]  Subjective: Chart reviewed, case discussed with staff, patient seen during rounds.  Today patient said that she feels better.  Patient is more animated and cooperative today.  She is more visible on the unit.  she was encouraged to participate in milieu and attend group.   She denies psychotic or manic symptoms.  Patient was encouraged to work with Child psychotherapist on the unit and rehab options.  Patient has not completed her PHQ-9.  She was encouraged to complete PHQ-9 assessment.  Patient has a wound which is of concern.  At this time patient is denying any pain.  Her WBC count is normal.  She is not running fever.  Will continue to monitor closely.  Today the wound seems to be dry and healing well.  Intern with internal medicine at [facility] health was consulted yesterday.  Apparently they do not consult on patient's admitted to [facility].  If patient's wound gets worse we will send the patient to the ER for evaluation.  This was discussed with RN  Diagnosis:  Final diagnoses:  Stimulant use disorder  Cocaine abuse (HCC)  Hypertension, unspecified type      Past Psychiatric History: See H&P Past Medical History: See H&P Family History: See H&P Family Psychiatric  History: See H&P Social History: See H&P             Sleep: Poor  Appetite:  Poor  Current Medications:  Current Facility-Administered Medications  Medication Dose Route Frequency Provider Last Rate Last Admin   acetaminophen  (TYLENOL ) tablet 650 mg  650 mg Oral Q6H PRN Carrion-Carrero, Jacalyn Martin, MD   650 mg at 02/12/24 2216   alum & mag hydroxide-simeth (MAALOX/MYLANTA) 200-200-20 MG/5ML suspension 30 mL  30 mL Oral Q4H PRN Carrion-Carrero, Jacalyn Martin, MD   30 mL at 02/10/24 1139   amLODipine  (NORVASC ) tablet 5 mg  5 mg Oral Daily Carrion-Carrero, Margely, MD   5 mg at 02/13/24 0955   cloNIDine  (CATAPRES ) tablet 0.1 mg  0.1 mg Oral  Q6H PRN Carrion-Carrero, Jacalyn Martin, MD   0.1 mg at 02/10/24 2144   haloperidol  (HALDOL ) tablet 5 mg  5 mg Oral TID PRN Baltazar Bonier, MD       And   diphenhydrAMINE  (BENADRYL ) capsule 50 mg  50 mg Oral TID PRN Carrion-Carrero, Margely, MD       haloperidol  lactate (HALDOL ) injection 5 mg  5 mg Intramuscular TID PRN Carrion-Carrero, Margely, MD       And   diphenhydrAMINE  (BENADRYL ) injection 50 mg  50 mg Intramuscular TID PRN Carrion-Carrero, Margely, MD       And   LORazepam  (ATIVAN ) injection 2 mg  2 mg Intramuscular TID PRN Carrion-Carrero, Margely, MD       haloperidol  lactate (HALDOL ) injection 10 mg  10 mg Intramuscular TID PRN Carrion-Carrero, Margely, MD       And   diphenhydrAMINE  (BENADRYL ) injection 50 mg  50 mg Intramuscular TID PRN Carrion-Carrero, Margely, MD       And   LORazepam  (ATIVAN ) injection 2 mg  2 mg Intramuscular TID PRN Carrion-Carrero, Margely, MD       hydrOXYzine  (ATARAX ) tablet 25 mg  25 mg Oral TID PRN Carrion-Carrero, Margely, MD   25 mg at 02/10/24 2144   magnesium  hydroxide (MILK OF MAGNESIA) suspension 30 mL  30 mL Oral Daily PRN Baltazar Bonier, MD   30 mL at 02/13/24 0958   traZODone  (DESYREL )  tablet 50 mg  50 mg Oral QHS PRN Baltazar Bonier, MD   50 mg at 02/12/24 2216   Current Outpatient Medications  Medication Sig Dispense Refill   amLODipine  (NORVASC ) 5 MG tablet Take 1 tablet (5 mg total) by mouth daily.     aspirin  EC 81 MG tablet Take 1 tablet (81 mg total) by mouth at bedtime. Swallow whole. (Patient not taking: Reported on 02/09/2024) 30 tablet 0   atorvastatin  (LIPITOR) 40 MG tablet Take 1 tablet (40 mg total) by mouth at bedtime. (Patient not taking: Reported on 02/09/2024) 30 tablet 0   Vitamin D , Ergocalciferol , (DRISDOL ) 1.25 MG (50000 UNIT) CAPS capsule Take 1 capsule (50,000 Units total) by mouth every 7 (seven) days. (Patient not taking: Reported on 02/09/2024) 5 capsule 0    Labs  Lab Results:  Admission on  02/09/2024  Component Date Value Ref Range Status   WBC 02/12/2024 5.1  4.0 - 10.5 K/uL Final   RBC 02/12/2024 4.40  3.87 - 5.11 MIL/uL Final   Hemoglobin 02/12/2024 12.8  12.0 - 15.0 g/dL Final   HCT 16/07/9603 39.6  36.0 - 46.0 % Final   MCV 02/12/2024 90.0  80.0 - 100.0 fL Final   MCH 02/12/2024 29.1  26.0 - 34.0 pg Final   MCHC 02/12/2024 32.3  30.0 - 36.0 g/dL Final   RDW 54/06/8118 13.6  11.5 - 15.5 % Final   Platelets 02/12/2024 265  150 - 400 K/uL Final   nRBC 02/12/2024 0.0  0.0 - 0.2 % Final   Neutrophils Relative % 02/12/2024 40  % Final   Neutro Abs 02/12/2024 2.1  1.7 - 7.7 K/uL Final   Lymphocytes Relative 02/12/2024 52  % Final   Lymphs Abs 02/12/2024 2.6  0.7 - 4.0 K/uL Final   Monocytes Relative 02/12/2024 7  % Final   Monocytes Absolute 02/12/2024 0.4  0.1 - 1.0 K/uL Final   Eosinophils Relative 02/12/2024 1  % Final   Eosinophils Absolute 02/12/2024 0.1  0.0 - 0.5 K/uL Final   Basophils Relative 02/12/2024 0  % Final   Basophils Absolute 02/12/2024 0.0  0.0 - 0.1 K/uL Final   Immature Granulocytes 02/12/2024 0  % Final   Abs Immature Granulocytes 02/12/2024 0.01  0.00 - 0.07 K/uL Final   Performed at [facility], [address] [place], [place] [address]   Sodium 02/12/2024 143  135 - 145 mmol/L Final   Potassium 02/12/2024 3.9  3.5 - 5.1 mmol/L Final   Chloride 02/12/2024 106  98 - 111 mmol/L Final   CO2 02/12/2024 27  22 - 32 mmol/L Final   Glucose, Bld 02/12/2024 78  70 - 99 mg/dL Final   Glucose reference range applies only to samples taken after fasting for at least 8 hours.   BUN 02/12/2024 13  6 - 20 mg/dL Final   Creatinine, Ser 02/12/2024 0.76  0.44 - 1.00 mg/dL Final   Calcium  02/12/2024 8.8 (L)  8.9 - 10.3 mg/dL Final   Total Protein 95/62/1308 6.3 (L)  6.5 - 8.1 g/dL Final   Albumin 65/78/4696 3.0 (L)  3.5 - 5.0 g/dL Final   AST 29/52/8413 16  15 - 41 U/L Final   ALT 02/12/2024 19  0 - 44 U/L Final   Alkaline Phosphatase 02/12/2024 84  38 -  126 U/L Final   Total Bilirubin 02/12/2024 0.3  0.0 - 1.2 mg/dL Final   GFR, Estimated 02/12/2024 >60  >60 mL/min Final   Comment: (NOTE)  Calculated using the CKD-EPI Creatinine Equation (2021)    Anion gap 02/12/2024 10  5 - 15 Final   Performed at [facility], [address], [place], [place] [address]   TSH 02/12/2024 1.979  0.350 - 4.500 uIU/mL Final   Comment: Performed by a 3rd Generation assay with a functional sensitivity of <=0.01 uIU/mL. Performed at [facility], [address] [place], [place] [address]    Cholesterol 02/12/2024 191  0 - 200 mg/dL Final   Triglycerides 14/78/2956 78  <150 mg/dL Final   HDL 21/30/8657 56  >40 mg/dL Final   Total CHOL/HDL Ratio 02/12/2024 3.4  RATIO Final   VLDL 02/12/2024 16  0 - 40 mg/dL Final   LDL Cholesterol 02/12/2024 119 (H)  0 - 99 mg/dL Final   Comment:        Total Cholesterol/HDL:CHD Risk Coronary Heart Disease Risk Table                     Men   Women  1/2 Average Risk   3.4   3.3  Average Risk       5.0   4.4  2 X Average Risk   9.6   7.1  3 X Average Risk  23.4   11.0        Use the calculated Patient Ratio above and the CHD Risk Table to determine the patient's CHD Risk.        ATP III CLASSIFICATION (LDL):  <100     mg/dL   Optimal  846-962  mg/dL   Near or Above                    Optimal  130-159  mg/dL   Borderline  952-841  mg/dL   High  >324     mg/dL   Very High Performed at [facility], [address] [place], [place] [address]   Admission on 02/09/2024, Discharged on 02/09/2024  Component Date Value Ref Range Status   Preg Test, Ur 02/09/2024 Negative  Negative Final   POC Amphetamine UR 02/09/2024 None Detected  NONE DETECTED (Cut Off Level 1000 ng/mL) Final   POC Secobarbital (BAR) 02/09/2024 None Detected  NONE DETECTED (Cut Off Level 300 ng/mL) Final   POC Buprenorphine (BUP) 02/09/2024 None Detected  NONE DETECTED (Cut Off Level 10 ng/mL) Final   POC Oxazepam (BZO) 02/09/2024  None Detected  NONE DETECTED (Cut Off Level 300 ng/mL) Final   POC Cocaine UR 02/09/2024 Positive (A)  NONE DETECTED (Cut Off Level 300 ng/mL) Final   POC Methamphetamine UR 02/09/2024 None Detected  NONE DETECTED (Cut Off Level 1000 ng/mL) Final   POC Morphine 02/09/2024 None Detected  NONE DETECTED (Cut Off Level 300 ng/mL) Final   POC Methadone UR 02/09/2024 None Detected  NONE DETECTED (Cut Off Level 300 ng/mL) Final   POC Oxycodone UR 02/09/2024 None Detected  NONE DETECTED (Cut Off Level 100 ng/mL) Final   POC Marijuana UR 02/09/2024 Positive (A)  NONE DETECTED (Cut Off Level 50 ng/mL) Final  Admission on 11/21/2023, Discharged on 11/29/2023  Component Date Value Ref Range Status   Folate 11/24/2023 7.2  >5.9 ng/mL Final   Performed at [facility], [address] [place], [place] [address]   RPR Ser Ql 11/24/2023 NON REACTIVE  NON REACTIVE Final   Performed at [facility], [address] [place], [place] [address]   Vit D,  25-Hydroxy 11/24/2023 10.71 (L)  30 - 100 ng/mL Final   Comment: (NOTE) Vitamin D  deficiency has been defined by the Institute of Medicine  and an Endocrine Society practice guideline as a level of serum 25-OH  vitamin D  less than 20 ng/mL (1,2). The Endocrine Society went on to  further define vitamin D  insufficiency as a level between 21 and 29  ng/mL (2).  1. IOM (Institute of Medicine). 2010. Dietary reference intakes for  calcium  and D. [place]: [employer]. 2. Holick MF, Binkley Kimberly, Bischoff-Ferrari HA, et al. Evaluation,  treatment, and prevention of vitamin D  deficiency: an Endocrine  Society clinical practice guideline, JCEM. 2011 Jul; 96(7): 1911-30.  Performed at [facility], [address] [place], [place] [address]    Sodium 11/24/2023 142  135 - 145 mmol/L Final   Potassium 11/24/2023 3.9  3.5 - 5.1 mmol/L Final   Chloride 11/24/2023 110  98 - 111 mmol/L Final   CO2 11/24/2023 26  22 -  32 mmol/L Final   Glucose, Bld 11/24/2023 81  70 - 99 mg/dL Final   Glucose reference range applies only to samples taken after fasting for at least 8 hours.   BUN 11/24/2023 14  6 - 20 mg/dL Final   Creatinine, Ser 11/24/2023 0.62  0.44 - 1.00 mg/dL Final   Calcium  11/24/2023 9.0  8.9 - 10.3 mg/dL Final   GFR, Estimated 11/24/2023 >60  >60 mL/min Final   Comment: (NOTE) Calculated using the CKD-EPI Creatinine Equation (2021)    Anion gap 11/24/2023 6  5 - 15 Final   Performed at [facility], [address] [place], [place] [address]   Sed Rate 11/24/2023 11  0 - 22 mm/hr Final   Performed at [facility], [address], [place], [place] [address]   CRP 11/24/2023 0.8  <1.0 mg/dL Final   Performed at [facility], [address], [place], [place] [address]   ANA Ab, IFA 11/24/2023 Negative   Final   Comment: (NOTE)                                     Negative   <1:80                                     Borderline  1:80                                     Positive   >1:80 ICAP nomenclature: AC-0 For more information about Hep-2 cell patterns use ANApatterns.org, the official website for the International Consensus on Antinuclear Antibody (ANA) Patterns (ICAP). Performed At: [facility] [address] [place], [place] [id number] Pearlean Botts MD WU:[id number]   Admission on 11/21/2023, Discharged on 11/21/2023  Component Date Value Ref Range Status   WBC 11/21/2023 6.5  4.0 - 10.5 K/uL Final   RBC 11/21/2023 4.91  3.87 - 5.11 MIL/uL Final   Hemoglobin 11/21/2023 14.0  12.0 - 15.0 g/dL Final   HCT 25/36/6440 44.1  36.0 - 46.0 % Final   MCV 11/21/2023 89.8  80.0 - 100.0 fL Final   MCH 11/21/2023 28.5  26.0 - 34.0 pg Final   MCHC 11/21/2023 31.7  30.0 - 36.0 g/dL Final   RDW 09/81/1914 14.0  11.5 - 15.5 % Final   Platelets 11/21/2023 259  150 - 400 K/uL Final   nRBC 11/21/2023 0.0  0.0 - 0.2 % Final   Neutrophils Relative %  11/21/2023 61  % Final   Neutro Abs 11/21/2023 4.0  1.7 - 7.7 K/uL Final   Lymphocytes Relative 11/21/2023 31  % Final   Lymphs Abs 11/21/2023 2.0  0.7 - 4.0 K/uL Final   Monocytes Relative 11/21/2023 6  % Final   Monocytes Absolute 11/21/2023 0.4  0.1 - 1.0 K/uL Final   Eosinophils Relative 11/21/2023 1  % Final   Eosinophils Absolute 11/21/2023 0.1  0.0 - 0.5 K/uL Final   Basophils Relative 11/21/2023 1  % Final   Basophils Absolute 11/21/2023 0.0  0.0 - 0.1 K/uL Final   Immature Granulocytes 11/21/2023 0  % Final   Abs Immature Granulocytes 11/21/2023 0.02  0.00 - 0.07 K/uL Final   Performed at [facility], [address] [place], [place] [address]   Sodium 11/21/2023 146 (H)  135 - 145 mmol/L Final   Potassium 11/21/2023 3.7  3.5 - 5.1 mmol/L Final   Chloride 11/21/2023 104  98 - 111 mmol/L Final   CO2 11/21/2023 29  22 - 32 mmol/L Final   Glucose, Bld 11/21/2023 96  70 - 99 mg/dL Final   Glucose reference range applies only to samples taken after fasting for at least 8 hours.   BUN 11/21/2023 13  6 - 20 mg/dL Final   Creatinine, Ser 11/21/2023 0.80  0.44 - 1.00 mg/dL Final   Calcium  11/21/2023 9.3  8.9 - 10.3 mg/dL Final   Total Protein 62/13/0865 6.6  6.5 - 8.1 g/dL Final   Albumin 78/46/9629 3.9  3.5 - 5.0 g/dL Final   AST 52/84/1324 17  15 - 41 U/L Final   ALT 11/21/2023 19  0 - 44 U/L Final   Alkaline Phosphatase 11/21/2023 80  38 - 126 U/L Final   Total Bilirubin 11/21/2023 0.4  0.0 - 1.2 mg/dL Final   GFR, Estimated 11/21/2023 >60  >60 mL/min Final   Comment: (NOTE) Calculated using the CKD-EPI Creatinine Equation (2021)    Anion gap 11/21/2023 13  5 - 15 Final   Performed at [facility], [address] [place], [place] [address]   Hgb A1c MFr Bld 11/21/2023 5.8 (H)  4.8 - 5.6 % Final   Comment: (NOTE)         Prediabetes: 5.7 - 6.4         Diabetes: >6.4         Glycemic control for adults with diabetes: <7.0    Mean Plasma Glucose 11/21/2023 120   mg/dL Final   Comment: (NOTE) Performed At: [facility] [address] [place], [place] [id number] Pearlean Botts MD [id number]    Magnesium  11/21/2023 2.2  1.7 - 2.4 mg/dL Final   Performed at [facility], [address], [place], [place] [address]   Alcohol, Ethyl (B) 11/21/2023 <10  <10 mg/dL Final   Comment: (NOTE) Lowest detectable limit for serum alcohol is 10 mg/dL.  For medical purposes only. Performed at [facility], [address], [place], [place] [address]    Cholesterol 11/21/2023 205 (H)  0 - 200 mg/dL Final   Triglycerides 84/16/6063 99  <150 mg/dL Final   HDL 01/60/1093 63  >40 mg/dL Final   Total CHOL/HDL Ratio 11/21/2023  3.3  RATIO Final   VLDL 11/21/2023 20  0 - 40 mg/dL Final   LDL Cholesterol 11/21/2023 122 (H)  0 - 99 mg/dL Final   Comment:        Total Cholesterol/HDL:CHD Risk Coronary Heart Disease Risk Table                     Men   Women  1/2 Average Risk   3.4   3.3  Average Risk       5.0   4.4  2 X Average Risk   9.6   7.1  3 X Average Risk  23.4   11.0        Use the calculated Patient Ratio above and the CHD Risk Table to determine the patient's CHD Risk.        ATP III CLASSIFICATION (LDL):  <100     mg/dL   Optimal  191-478  mg/dL   Near or Above                    Optimal  130-159  mg/dL   Borderline  295-621  mg/dL   High  >308     mg/dL   Very High Performed at [facility], [address], [place], [place] [address]    TSH 11/21/2023 0.723  0.350 - 4.500 uIU/mL Final   Comment: Performed by a 3rd Generation assay with a functional sensitivity of <=0.01 uIU/mL. Performed at [facility], [address], [place], [place] [address]    Preg Test, Ur 11/21/2023 Negative  Negative Final   Color, Urine 11/21/2023 AMBER (A)  YELLOW Final   BIOCHEMICALS MAY BE AFFECTED BY COLOR   APPearance 11/21/2023 CLOUDY (A)  CLEAR Final   Specific Gravity, Urine 11/21/2023 1.021  1.005 - 1.030 Final   pH  11/21/2023 6.0  5.0 - 8.0 Final   Glucose, UA 11/21/2023 NEGATIVE  NEGATIVE mg/dL Final   Hgb urine dipstick 11/21/2023 NEGATIVE  NEGATIVE Final   Bilirubin Urine 11/21/2023 NEGATIVE  NEGATIVE Final   Ketones, ur 11/21/2023 NEGATIVE  NEGATIVE mg/dL Final   Protein, ur 52/84/1324 30 (A)  NEGATIVE mg/dL Final   Nitrite 40/07/2724 NEGATIVE  NEGATIVE Final   Leukocytes,Ua 11/21/2023 SMALL (A)  NEGATIVE Final   RBC / HPF 11/21/2023 0-5  0 - 5 RBC/hpf Final   WBC, UA 11/21/2023 6-10  0 - 5 WBC/hpf Final   Bacteria, UA 11/21/2023 RARE (A)  NONE SEEN Final   Squamous Epithelial / HPF 11/21/2023 21-50  0 - 5 /HPF Final   Mucus 11/21/2023 PRESENT   Final   Performed at [facility], [address], [place], [place] [address]   POC Amphetamine UR 11/21/2023 None Detected  NONE DETECTED (Cut Off Level 1000 ng/mL) Final   POC Secobarbital (BAR) 11/21/2023 None Detected  NONE DETECTED (Cut Off Level 300 ng/mL) Final   POC Buprenorphine (BUP) 11/21/2023 None Detected  NONE DETECTED (Cut Off Level 10 ng/mL) Final   POC Oxazepam (BZO) 11/21/2023 None Detected  NONE DETECTED (Cut Off Level 300 ng/mL) Final   POC Cocaine UR 11/21/2023 Positive (A)  NONE DETECTED (Cut Off Level 300 ng/mL) Final   POC Methamphetamine UR 11/21/2023 None Detected  NONE DETECTED (Cut Off Level 1000 ng/mL) Final   POC Morphine 11/21/2023 None Detected  NONE DETECTED (Cut Off Level 300 ng/mL) Final   POC Methadone UR 11/21/2023 None Detected  NONE DETECTED (Cut Off Level  300 ng/mL) Final   POC Oxycodone UR 11/21/2023 None Detected  NONE DETECTED (Cut Off Level 100 ng/mL) Final   POC Marijuana UR 11/21/2023 None Detected  NONE DETECTED (Cut Off Level 50 ng/mL) Final   Vitamin B-12 11/21/2023 306  180 - 914 pg/mL Final   Comment: (NOTE) This assay is not validated for testing neonatal or myeloproliferative syndrome specimens for Vitamin B12 levels. Performed at [facility], [address] [place], [place] [address]     T3, Free 11/21/2023 3.4  2.0 - 4.4 pg/mL Final   Comment: (NOTE) Performed At: [facility] [address] [place], [place] [id number] Pearlean Botts MD [id number]    Free T4 11/21/2023 0.88  0.61 - 1.12 ng/dL Final   Comment: (NOTE) Biotin ingestion may interfere with free T4 tests. If the results are inconsistent with the TSH level, previous test results, or the clinical presentation, then consider biotin interference. If needed, order repeat testing after stopping biotin. Performed at [facility], [address] [place], [place] [address]    Preg Test, Ur 11/21/2023 NEGATIVE  NEGATIVE Final   Comment:        THE SENSITIVITY OF THIS METHODOLOGY IS >24 mIU/mL     Blood Alcohol level:  Lab Results  Component Value Date   ETH <10 11/21/2023   ETH <10 07/08/2023    Metabolic Disorder Labs: Lab Results  Component Value Date   HGBA1C 5.8 (H) 11/21/2023   MPG 120 11/21/2023   MPG 119.76 07/11/2023   Lab Results  Component Value Date   PROLACTIN 7.6 08/04/2022   Lab Results  Component Value Date   CHOL 191 02/12/2024   TRIG 78 02/12/2024   HDL 56 02/12/2024   CHOLHDL 3.4 02/12/2024   VLDL 16 02/12/2024   LDLCALC 119 (H) 02/12/2024   LDLCALC 122 (H) 11/21/2023    Therapeutic Lab Levels: No results found for: "LITHIUM" No results found for: "VALPROATE" No results found for: "CBMZ"  Physical Findings   AIMS    Flowsheet Row [facility]  AIMS Total Score 0      AUDIT    Flowsheet Row ED from 02/09/2024 in [facility] Admission (Discharged[facility] Ad[facility] (Discharged[facility]  Alcohol Use Disorder Identification Test Final Score (AUDIT) 0 0 2      PHQ2-9    Flowsheet Row ED from 07/09/2023 in [facility] ED from 07/08/2023 in [facility] ED from 08/04/2022 in [facility]  PHQ-2 Total Score 2 2 4   PHQ-9 Total Score 8 7 10       Flowsheet Row ED from 02/09/2024 in [facility] Most recent reading at 02/09/2024  4:07 PM ED from 02/09/2024 in [facility] Most recent reading at 02/09/2024  2:22 AM Admission (Discharged) [facility] Most recent reading at 11/21/2023  5:40 PM  C-SSRS RISK CATEGORY Moderate Risk Low Risk Low Risk        Musculoskeletal  Strength & Muscle Tone: within normal limits Gait & Station: normal Patient leans: N/A  Psychiatric Specialty Exam  Presentation General Appearance:  Appropriate for Environment   Eye Contact: Fleeting   Speech: Clear and Coherent   Speech Volume: Decreased  Mood and Affect  Mood: "Good"   Affect: More animated     Thought Process  Thought Processes: Linear   Descriptions of Associations:Intact   Orientation:Full (Time, Place and Person)   Thought Content:WDL  Diagnosis of Schizophrenia or Schizoaffective disorder in past: No   Hallucinations:Hallucinations: None   Ideas of Reference:None   Suicidal Thoughts:Suicidal Thoughts: Denies   Homicidal Thoughts:Homicidal Thoughts: No     Sensorium  Memory: Immediate Fair   Judgment: Poor   Insight: Fair     Art therapist  Concentration: Fair   Attention Span: Fair   Recall: Good   Fund of Knowledge: Good   Language: Good     Psychomotor Activity  Psychomotor Activity: Psychomotor Activity: Normal     Assets  Assets: Desire for Improvement; Housing; Vocational/Educational     Sleep  Sleep:Fair      Physical Exam   Facility Based Crisis Admission H&P  Physical Exam Vitals and nursing note reviewed.  Constitutional:      General: She is not in  acute distress.    Appearance: She is not ill-appearing.  HENT:     Head: Normocephalic and atraumatic.  Pulmonary:     Effort: Pulmonary effort is normal. No respiratory distress.  Musculoskeletal:        General: Normal range of motion.  Skin:    General: Skin is warm and dry.  Comments: Laceration at the R gluteal region, nonsuppurative, nonpurulent    Review of Systems  Constitutional:  Negative for chills and fever.  HENT:  Negative for hearing loss and sore throat.   Eyes:  Negative for blurred vision.  Respiratory:  Negative for cough and shortness of breath.   Cardiovascular:  Negative for chest pain and palpitations.  Gastrointestinal:  Negative for nausea and vomiting.  Psychiatric/Behavioral:  Positive for depression and substance abuse. Negative for suicidal ideas. The patient is nervous/anxious.    Blood pressure (!) 146/90, pulse 74, temperature 97.8 F (36.6 C), temperature source Oral, resp. rate 18, last menstrual period 11/18/2018, SpO2 100%. There is no height or weight on file to calculate BMI.  Treatment Plan Summary: Daily contact with patient to assess and evaluate symptoms and progress in treatment and Medication management  At this point in time patient does not want to take any psychotropic medicine.  Will continue symptomatic treatment  HTN Restart home amlodipine    R gluteal laceration Wound care and neomycin  ointment   Other PRNs: acetaminophen , 650 mg, Q6H PRN alum & mag hydroxide-simeth, 30 mL, Q4H PRN haloperidol , 5 mg, TID PRN  And diphenhydrAMINE , 50 mg, TID PRN haloperidol  lactate, 5 mg, TID PRN  And diphenhydrAMINE , 50 mg, TID PRN  And LORazepam , 2 mg, TID PRN haloperidol  lactate, 10 mg, TID PRN  And diphenhydrAMINE , 50 mg, TID PRN  And LORazepam , 2 mg, TID PRN hydrOXYzine , 25 mg, TID PRN magnesium  hydroxide, 30 mL, Daily PRN traZODone , 50 mg, QHS PRN       Disposition: pt expressed interest in residential rehabilitation     Silas Drivers, MD

## 2024-02-13 NOTE — ED Notes (Signed)
Patient is sleeping. Respirations equal and unlabored, skin warm and dry, NAD. No change in assessment or acuity. Routine safety checks conducted according to facility protocol. Will continue to monitor for safety.   

## 2024-02-14 DIAGNOSIS — F1410 Cocaine abuse, uncomplicated: Secondary | ICD-10-CM | POA: Diagnosis not present

## 2024-02-14 DIAGNOSIS — F1510 Other stimulant abuse, uncomplicated: Secondary | ICD-10-CM | POA: Diagnosis not present

## 2024-02-14 DIAGNOSIS — I10 Essential (primary) hypertension: Secondary | ICD-10-CM | POA: Diagnosis not present

## 2024-02-14 DIAGNOSIS — Z79899 Other long term (current) drug therapy: Secondary | ICD-10-CM | POA: Diagnosis not present

## 2024-02-14 NOTE — Group Note (Signed)
 Group Topic: Positive Affirmations  Group Date: 02/14/2024 Start Time: 1230 End Time: 1315 Facilitators: Emelia Hang  Department: [facility]  Number of Participants: 10  Group Focus: affirmation Treatment Modality:  Psychoeducation Interventions utilized were support Purpose: reinforce self-care  Name: Alexandra Buckley Date of Birth: Oct 02, 1971  MR: [id number]    Level of Participation: active Quality of Participation: cooperative Interactions with others: gave feedback Mood/Affect: appropriate Triggers (if applicable): NA Cognition: coherent/clear Progress: Significant Response: Pt connected with the affirmation "I am excited for the new life I get to make for myself" and "My life is joyful and happy". Pt shares that her positive interactions with her loved ones provides her joy and strength. Pt chose the personal affirmation "I am beautiful".  Plan: follow-up needed  Patients Problems:  Patient Active Problem List   Diagnosis Date Noted   Stimulant use disorder 02/09/2024   Prediabetes 08/10/2023   H/O: CVA (cerebrovascular accident) 07/11/2023   Substance induced mood disorder (HCC) 07/09/2023   Cocaine use 05/20/2023   Acute CVA (cerebrovascular accident) (HCC) 05/19/2023   History of trichomoniasis 10/08/2022   Recurrent major depressive disorder, in remission (HCC) 08/31/2022   Psychophysiological insomnia 08/31/2022   Essential hypertension 08/11/2022   Chronic left shoulder pain 08/11/2022   Chronic bilateral low back pain without sciatica 08/11/2022   Mixed hyperlipidemia 08/11/2022   History of suicide attempt 08/05/2022   Tobacco use disorder 08/05/2022   Cocaine abuse with cocaine-induced mood disorder (HCC) 08/04/2022   MDD (major depressive disorder), recurrent severe, without psychosis (HCC) 03/02/2017

## 2024-02-14 NOTE — ED Notes (Signed)
 Pt sleeping in no acute distress. RR even and unlabored. Environment secured. Will continue to monitor for safety.

## 2024-02-14 NOTE — Group Note (Signed)
 Group Topic: Communication  Group Date: 02/14/2024 Start Time: 0930 End Time: 1020 Facilitators: Denzil Flatten, RN  Department: [facility]  Number of Participants: 9  Group Focus: communication Treatment Modality:  Individual Therapy Interventions utilized were patient education Purpose: increase insight  Name: Alexandra Buckley Date of Birth: 1970/12/04  MR: [id number]    Level of Participation: active Quality of Participation: cooperative Interactions with others: gave feedback Mood/Affect: appropriate Triggers (if applicable): none identified Cognition: coherent/clear Progress: Gaining insight Response: verbalized understanding of all medications given Plan: patient will be encouraged to notify staff with any questions pertaining to medications and report any side effects of medication given  Patients Problems:  Patient Active Problem List   Diagnosis Date Noted   Stimulant use disorder 02/09/2024   Prediabetes 08/10/2023   H/O: CVA (cerebrovascular accident) 07/11/2023   Substance induced mood disorder (HCC) 07/09/2023   Cocaine use 05/20/2023   Acute CVA (cerebrovascular accident) (HCC) 05/19/2023   History of trichomoniasis 10/08/2022   Recurrent major depressive disorder, in remission (HCC) 08/31/2022   Psychophysiological insomnia 08/31/2022   Essential hypertension 08/11/2022   Chronic left shoulder pain 08/11/2022   Chronic bilateral low back pain without sciatica 08/11/2022   Mixed hyperlipidemia 08/11/2022   History of suicide attempt 08/05/2022   Tobacco use disorder 08/05/2022   Cocaine abuse with cocaine-induced mood disorder (HCC) 08/04/2022   MDD (major depressive disorder), recurrent severe, without psychosis (HCC) 03/02/2017

## 2024-02-14 NOTE — ED Notes (Signed)
 Patient A&Ox4. Denies intent to harm self/others when asked. Denies A/VH. Patient denies any physical complaints except to R buttock. Writer performed wound care to healing area noted on R buttock. Writer  observed 2 dot sized areas with scant serous drainage to wound bed. Scab formation noted to wound. No s/s of infection noted. Pt denies pain at site. Pt states, "It just itches but I won't scatch it". Support and encouragement provided. S/S of infection reviewed with pt and informed to notify staff with any concerns. Infection control education rewiewed with pt to include proper hygiene and cleaning around wound bed. Routine safety checks conducted according to facility protocol. Encouraged patient to notify staff if thoughts of harm toward self or others arise. Patient verbalize understanding and agreement. Will continue to monitor for safety.

## 2024-02-14 NOTE — ED Notes (Signed)
 Pt talking on phone. No acute distress noted. No concerns voiced. Informed pt to notify staff with any needs or assistance. Pt verbalized understanding and agreement. Will continue to monitor for safety.

## 2024-02-14 NOTE — ED Notes (Signed)
 Patient in the bedroom calm and sleeping.NAD. Environment secured.  Will continue to monitor for safety
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
## 2024-02-14 NOTE — ED Notes (Signed)
 Patient in the bedroom calm and sleeping with eyes closed. Respirations even and unlabored.  Will continue to monitor for safety.

## 2024-02-14 NOTE — ED Notes (Signed)
 Patient is sleeping. Respirations equal and unlabored, skin warm and dry. No change in assessment or acuity. Routine safety checks conducted according to facility protocol. Will continue to monitor for safety.

## 2024-02-15 DIAGNOSIS — F1510 Other stimulant abuse, uncomplicated: Secondary | ICD-10-CM | POA: Diagnosis not present

## 2024-02-15 DIAGNOSIS — F1410 Cocaine abuse, uncomplicated: Secondary | ICD-10-CM | POA: Diagnosis not present

## 2024-02-15 DIAGNOSIS — Z79899 Other long term (current) drug therapy: Secondary | ICD-10-CM | POA: Diagnosis not present

## 2024-02-15 DIAGNOSIS — I10 Essential (primary) hypertension: Secondary | ICD-10-CM | POA: Diagnosis not present

## 2024-02-15 NOTE — Discharge Planning (Signed)
 SW spoke with Moira Andrews at [facility] to confirm patients approval for admission planned for Friday 5/16 and will need a cab voucher provided for transport at 8AM in order to arrive to [facility] before 9am. She will need a 7 day supply of medications and a 30 day script. MD was made aware and SW spoke with patient. Will continue to follow.

## 2024-02-15 NOTE — Group Note (Signed)
 Group Topic: Emotional Regulation  Group Date: 02/15/2024 Start Time: 2000 End Time: 2030 Facilitators: Alvino Joseph, NT  Department: [facility]  Number of Participants: 9  Group Focus: self-awareness Treatment Modality:  Individual Therapy Interventions utilized were clarification Purpose: express feelings  Name: Alexandra Buckley Date of Birth: [birth year]  MR: [id number]    Level of Participation did not participate Quality of Participation: cooperative Interactions with others: n/a Mood/Affect: n/a Triggers (if applicable):n/a Cognition:n/a Progress: Other Responsen/a Plan: patient will be encouraged to attend group in future  Patients Problems:  Patient Active Problem List   Diagnosis Date Noted   Stimulant use disorder 02/09/2024   Prediabetes 08/10/2023   H/O: CVA (cerebrovascular accident) 07/11/2023   Substance induced mood disorder (HCC) 07/09/2023   Cocaine use 05/20/2023   Acute CVA (cerebrovascular accident) (HCC) 05/19/2023   History of trichomoniasis 10/08/2022   Recurrent major depressive disorder, in remission (HCC) 08/31/2022   Psychophysiological insomnia 08/31/2022   Essential hypertension 08/11/2022   Chronic left shoulder pain 08/11/2022   Chronic bilateral low back pain without sciatica 08/11/2022   Mixed hyperlipidemia 08/11/2022   History of suicide attempt 08/05/2022   Tobacco use disorder 08/05/2022   Cocaine abuse with cocaine-induced mood disorder (HCC) 08/04/2022   MDD (major depressive disorder), recurrent severe, without psychosis (HCC) 03/02/2017

## 2024-02-15 NOTE — ED Notes (Signed)
 patient observed/assessed at bedside lying in bed awake. Patient alert and oriented x 4. Pt  denies A/V/H, SI and HI.  Pt complained of pain near the wound on her buttocks but refused wound assessment and pain medication when offered. Fluid and snack offered. Verbalizes no further complaints at this time. Will continue to monitor and support as needed.

## 2024-02-15 NOTE — ED Provider Notes (Signed)
 Behavioral Health Progress Note  Date and Time: 02/15/2024 4:06 PM Name: Alexandra Buckley MRN:  [id number]  Subjective:  Alexandra Buckley was seen in the common area on rounds today. She is sleeping and eating okay. She is having some itching at her buttocks wound site but is trying not to scratch. She denies other new physical complaints. She denies hallucinations, thoughts of harm to self or others. She is amenable to residential rehab and has been cooperative with SW for placement.   Diagnosis:  Final diagnoses:  Stimulant use disorder  Cocaine abuse (HCC)  Hypertension, unspecified type    Total Time spent with patient: 15 minutes  Past Psychiatric History:  Diagnoses: MDD, cocaine use d/o, substance induced mood d/o, tobacco use d/o, suicide attempt x2 (last time 08/2022), inpatient psych admission Medication trials: doesn't remember, as documented past trial of Zoloft  Hospitalizations: 2 prior psychiatric hospitalizations.  Last admission at [facility] from 11/21/2023-11/30/2023 for worsening substance use and SI. Suicide attempts: x2 - 08/2022, 2018 - ingestion of cleaning products   Substance Use History: Nicotine :  reports that she has quit smoking. Her smoking use included cigarettes. She has never used smokeless tobacco. IV drug use: Denied Stimulants: Cocaine, active, daily use DT: Denied Detox: Yes Residential: Yes, previously discharged from [facility] to [facility] in 07/14/2023 Past Medical History:  Dx:  has a past medical history of History of suicide attempt (08/05/2022) and Tobacco use disorder (08/05/2022).  History of CVA in Aug 2024 Allergies: Patient has no known allergies.  Family History: unknown Family Psychiatric History obtained from chart review:  Brother, Sister- Crack Cocaine Abuse No Known Diagnosis' or Suicides Social History:  Housing: homeless Income: unemployed Family: daughter, grandchildren  Sleep: Good  Appetite:  Good  Current Medications:  Current  Facility-Administered Medications  Medication Dose Route Frequency Provider Last Rate Last Admin   acetaminophen  (TYLENOL ) tablet 650 mg  650 mg Oral Q6H PRN Carrion-Carrero, Jacalyn Martin, MD   650 mg at 02/15/24 1041   alum & mag hydroxide-simeth (MAALOX/MYLANTA) 200-200-20 MG/5ML suspension 30 mL  30 mL Oral Q4H PRN Carrion-Carrero, Margely, MD   30 mL at 02/10/24 1139   amLODipine  (NORVASC ) tablet 5 mg  5 mg Oral Daily Carrion-Carrero, Margely, MD   5 mg at 02/15/24 1042   cloNIDine  (CATAPRES ) tablet 0.1 mg  0.1 mg Oral Q6H PRN Carrion-Carrero, Jacalyn Martin, MD   0.1 mg at 02/10/24 2144   haloperidol  (HALDOL ) tablet 5 mg  5 mg Oral TID PRN Baltazar Bonier, MD       And   diphenhydrAMINE  (BENADRYL ) capsule 50 mg  50 mg Oral TID PRN Carrion-Carrero, Margely, MD       haloperidol  lactate (HALDOL ) injection 5 mg  5 mg Intramuscular TID PRN Carrion-Carrero, Margely, MD       And   diphenhydrAMINE  (BENADRYL ) injection 50 mg  50 mg Intramuscular TID PRN Carrion-Carrero, Margely, MD       And   LORazepam  (ATIVAN ) injection 2 mg  2 mg Intramuscular TID PRN Carrion-Carrero, Margely, MD       haloperidol  lactate (HALDOL ) injection 10 mg  10 mg Intramuscular TID PRN Carrion-Carrero, Margely, MD       And   diphenhydrAMINE  (BENADRYL ) injection 50 mg  50 mg Intramuscular TID PRN Carrion-Carrero, Margely, MD       And   LORazepam  (ATIVAN ) injection 2 mg  2 mg Intramuscular TID PRN Carrion-Carrero, Margely, MD       guaiFENesin -dextromethorphan (ROBITUSSIN DM) 100-10 MG/5ML syrup 10 mL  10 mL  Oral Q4H PRN Nicklas Barns, MD   10 mL at 02/13/24 1708   hydrOXYzine  (ATARAX ) tablet 25 mg  25 mg Oral TID PRN Baltazar Bonier, MD   25 mg at 02/10/24 2144   magnesium  hydroxide (MILK OF MAGNESIA) suspension 30 mL  30 mL Oral Daily PRN Carrion-Carrero, Jacalyn Martin, MD   30 mL at 02/13/24 0958   traZODone  (DESYREL ) tablet 50 mg  50 mg Oral QHS PRN Carrion-Carrero, Jacalyn Martin, MD   50 mg at 02/12/24 2216    Current Outpatient Medications  Medication Sig Dispense Refill   amLODipine  (NORVASC ) 5 MG tablet Take 1 tablet (5 mg total) by mouth daily.     aspirin  EC 81 MG tablet Take 1 tablet (81 mg total) by mouth at bedtime. Swallow whole. (Patient not taking: Reported on 02/09/2024) 30 tablet 0   atorvastatin  (LIPITOR) 40 MG tablet Take 1 tablet (40 mg total) by mouth at bedtime. (Patient not taking: Reported on 02/09/2024) 30 tablet 0   Vitamin D , Ergocalciferol , (DRISDOL ) 1.25 MG (50000 UNIT) CAPS capsule Take 1 capsule (50,000 Units total) by mouth every 7 (seven) days. (Patient not taking: Reported on 02/09/2024) 5 capsule 0    Labs  Lab Results:  Admission on 02/09/2024  Component Date Value Ref Range Status   WBC 02/12/2024 5.1  4.0 - 10.5 K/uL Final   RBC 02/12/2024 4.40  3.87 - 5.11 MIL/uL Final   Hemoglobin 02/12/2024 12.8  12.0 - 15.0 g/dL Final   HCT 53/66/4403 39.6  36.0 - 46.0 % Final   MCV 02/12/2024 90.0  80.0 - 100.0 fL Final   MCH 02/12/2024 29.1  26.0 - 34.0 pg Final   MCHC 02/12/2024 32.3  30.0 - 36.0 g/dL Final   RDW 47/42/5956 13.6  11.5 - 15.5 % Final   Platelets 02/12/2024 265  150 - 400 K/uL Final   nRBC 02/12/2024 0.0  0.0 - 0.2 % Final   Neutrophils Relative % 02/12/2024 40  % Final   Neutro Abs 02/12/2024 2.1  1.7 - 7.7 K/uL Final   Lymphocytes Relative 02/12/2024 52  % Final   Lymphs Abs 02/12/2024 2.6  0.7 - 4.0 K/uL Final   Monocytes Relative 02/12/2024 7  % Final   Monocytes Absolute 02/12/2024 0.4  0.1 - 1.0 K/uL Final   Eosinophils Relative 02/12/2024 1  % Final   Eosinophils Absolute 02/12/2024 0.1  0.0 - 0.5 K/uL Final   Basophils Relative 02/12/2024 0  % Final   Basophils Absolute 02/12/2024 0.0  0.0 - 0.1 K/uL Final   Immature Granulocytes 02/12/2024 0  % Final   Abs Immature Granulocytes 02/12/2024 0.01  0.00 - 0.07 K/uL Final   Performed at [facility], [address] [place], [place] [address]   Sodium 02/12/2024 143  135 - 145 mmol/L Final    Potassium 02/12/2024 3.9  3.5 - 5.1 mmol/L Final   Chloride 02/12/2024 106  98 - 111 mmol/L Final   CO2 02/12/2024 27  22 - 32 mmol/L Final   Glucose, Bld 02/12/2024 78  70 - 99 mg/dL Final   Glucose reference range applies only to samples taken after fasting for at least 8 hours.   BUN 02/12/2024 13  6 - 20 mg/dL Final   Creatinine, Ser 02/12/2024 0.76  0.44 - 1.00 mg/dL Final   Calcium  02/12/2024 8.8 (L)  8.9 - 10.3 mg/dL Final   Total Protein 43/32/9518 6.3 (L)  6.5 - 8.1 g/dL Final   Albumin 84/16/6063 3.0 (  L)  3.5 - 5.0 g/dL Final   AST 16/07/9603 16  15 - 41 U/L Final   ALT 02/12/2024 19  0 - 44 U/L Final   Alkaline Phosphatase 02/12/2024 84  38 - 126 U/L Final   Total Bilirubin 02/12/2024 0.3  0.0 - 1.2 mg/dL Final   GFR, Estimated 02/12/2024 >60  >60 mL/min Final   Comment: (NOTE) Calculated using the CKD-EPI Creatinine Equation (2021)    Anion gap 02/12/2024 10  5 - 15 Final   Performed at [facility], [address] [place], [place] [address]   TSH 02/12/2024 1.979  0.350 - 4.500 uIU/mL Final   Comment: Performed by a 3rd Generation assay with a functional sensitivity of <=0.01 uIU/mL. Performed at [facility], [address] [place], [place] [address]    Cholesterol 02/12/2024 191  0 - 200 mg/dL Final   Triglycerides 78/29/5621 78  <150 mg/dL Final   HDL 30/86/5784 56  >40 mg/dL Final   Total CHOL/HDL Ratio 02/12/2024 3.4  RATIO Final   VLDL 02/12/2024 16  0 - 40 mg/dL Final   LDL Cholesterol 02/12/2024 119 (H)  0 - 99 mg/dL Final   Comment:        Total Cholesterol/HDL:CHD Risk Coronary Heart Disease Risk Table                     Men   Women  1/2 Average Risk   3.4   3.3  Average Risk       5.0   4.4  2 X Average Risk   9.6   7.1  3 X Average Risk  23.4   11.0        Use the calculated Patient Ratio above and the CHD Risk Table to determine the patient's CHD Risk.        ATP III CLASSIFICATION (LDL):  <100     mg/dL   Optimal  696-295   mg/dL   Near or Above                    Optimal  130-159  mg/dL   Borderline  284-132  mg/dL   High  >440     mg/dL   Very High Performed at [facility], [address], [place], [place] [address]   Admission on 02/09/2024, Discharged on 02/09/2024  Component Date Value Ref Range Status   Preg Test, Ur 02/09/2024 Negative  Negative Final   POC Amphetamine UR 02/09/2024 None Detected  NONE DETECTED (Cut Off Level 1000 ng/mL) Final   POC Secobarbital (BAR) 02/09/2024 None Detected  NONE DETECTED (Cut Off Level 300 ng/mL) Final   POC Buprenorphine (BUP) 02/09/2024 None Detected  NONE DETECTED (Cut Off Level 10 ng/mL) Final   POC Oxazepam (BZO) 02/09/2024 None Detected  NONE DETECTED (Cut Off Level 300 ng/mL) Final   POC Cocaine UR 02/09/2024 Positive (A)  NONE DETECTED (Cut Off Level 300 ng/mL) Final   POC Methamphetamine UR 02/09/2024 None Detected  NONE DETECTED (Cut Off Level 1000 ng/mL) Final   POC Morphine 02/09/2024 None Detected  NONE DETECTED (Cut Off Level 300 ng/mL) Final   POC Methadone UR 02/09/2024 None Detected  NONE DETECTED (Cut Off Level 300 ng/mL) Final   POC Oxycodone UR 02/09/2024 None Detected  NONE DETECTED (Cut Off Level 100 ng/mL) Final   POC Marijuana UR 02/09/2024 Positive (A)  NONE DETECTED (Cut Off Level 50 ng/mL) Final  Admission  on 11/21/2023, Discharged on 11/29/2023  Component Date Value Ref Range Status   Folate 11/24/2023 7.2  >5.9 ng/mL Final   Performed at [facility], [address] [place], [place] [address]   RPR Ser Ql 11/24/2023 NON REACTIVE  NON REACTIVE Final   Performed at [facility], [address] [place], [place] [address]   Vit D, 25-Hydroxy 11/24/2023 10.71 (L)  30 - 100 ng/mL Final   Comment: (NOTE) Vitamin D  deficiency has been defined by the Institute of Medicine  and an Endocrine Society practice guideline as a level of serum 25-OH  vitamin D  less than 20 ng/mL (1,2). The Endocrine Society went on to   further define vitamin D  insufficiency as a level between 21 and 29  ng/mL (2).  1. IOM (Institute of Medicine). 2010. Dietary reference intakes for  calcium  and D. [place]: [employer]. 2. Holick MF, Binkley [place], Bischoff-Ferrari HA, et al. Evaluation,  treatment, and prevention of vitamin D  deficiency: an Endocrine  Society clinical practice guideline, JCEM. 2011 Jul; 96(7): 1911-30.  Performed at [facility], [address], [place], [place] [address]    Sodium 11/24/2023 142  135 - 145 mmol/L Final   Potassium 11/24/2023 3.9  3.5 - 5.1 mmol/L Final   Chloride 11/24/2023 110  98 - 111 mmol/L Final   CO2 11/24/2023 26  22 - 32 mmol/L Final   Glucose, Bld 11/24/2023 81  70 - 99 mg/dL Final   Glucose reference range applies only to samples taken after fasting for at least 8 hours.   BUN 11/24/2023 14  6 - 20 mg/dL Final   Creatinine, Ser 11/24/2023 0.62  0.44 - 1.00 mg/dL Final   Calcium  11/24/2023 9.0  8.9 - 10.3 mg/dL Final   GFR, Estimated 11/24/2023 >60  >60 mL/min Final   Comment: (NOTE) Calculated using the CKD-EPI Creatinine Equation (2021)    Anion gap 11/24/2023 6  5 - 15 Final   Performed at [facility], [address] [place], [place] [address]   Sed Rate 11/24/2023 11  0 - 22 mm/hr Final   Performed at [facility], [address] [place], [place] [address]   CRP 11/24/2023 0.8  <1.0 mg/dL Final   Performed at [facility], [address], [place], [place] [address]   ANA Ab, IFA 11/24/2023 Negative   Final   Comment: (NOTE)                                     Negative   <1:80                                     Borderline  1:80                                     Positive   >1:80 ICAP nomenclature: AC-0 For more information about Hep-2 cell patterns use ANApatterns.org, the official website for the International Consensus on Antinuclear Antibody (ANA) Patterns (ICAP). Performed At: [facility] [address] [place], [place] [id number] Pearlean Botts MD LO:[id number]   Admission on 11/21/2023, Discharged on 11/21/2023  Component Date Value Ref Range Status   WBC  11/21/2023 6.5  4.0 - 10.5 K/uL Final   RBC 11/21/2023 4.91  3.87 - 5.11 MIL/uL Final   Hemoglobin 11/21/2023 14.0  12.0 - 15.0 g/dL Final   HCT 16/07/9603 44.1  36.0 - 46.0 % Final   MCV 11/21/2023 89.8  80.0 - 100.0 fL Final   MCH 11/21/2023 28.5  26.0 - 34.0 pg Final   MCHC 11/21/2023 31.7  30.0 - 36.0 g/dL Final   RDW 54/06/8118 14.0  11.5 - 15.5 % Final   Platelets 11/21/2023 259  150 - 400 K/uL Final   nRBC 11/21/2023 0.0  0.0 - 0.2 % Final   Neutrophils Relative % 11/21/2023 61  % Final   Neutro Abs 11/21/2023 4.0  1.7 - 7.7 K/uL Final   Lymphocytes Relative 11/21/2023 31  % Final   Lymphs Abs 11/21/2023 2.0  0.7 - 4.0 K/uL Final   Monocytes Relative 11/21/2023 6  % Final   Monocytes Absolute 11/21/2023 0.4  0.1 - 1.0 K/uL Final   Eosinophils Relative 11/21/2023 1  % Final   Eosinophils Absolute 11/21/2023 0.1  0.0 - 0.5 K/uL Final   Basophils Relative 11/21/2023 1  % Final   Basophils Absolute 11/21/2023 0.0  0.0 - 0.1 K/uL Final   Immature Granulocytes 11/21/2023 0  % Final   Abs Immature Granulocytes 11/21/2023 0.02  0.00 - 0.07 K/uL Final   Performed at [facility], [address], [place], [place] [address]   Sodium 11/21/2023 146 (H)  135 - 145 mmol/L Final   Potassium 11/21/2023 3.7  3.5 - 5.1 mmol/L Final   Chloride 11/21/2023 104  98 - 111 mmol/L Final   CO2 11/21/2023 29  22 - 32 mmol/L Final   Glucose, Bld 11/21/2023 96  70 - 99 mg/dL Final   Glucose reference range applies only to samples taken after fasting for at least 8 hours.   BUN 11/21/2023 13  6 - 20 mg/dL Final   Creatinine, Ser 11/21/2023 0.80  0.44 - 1.00 mg/dL Final   Calcium  11/21/2023 9.3  8.9 - 10.3 mg/dL Final   Total Protein 95/62/1308 6.6  6.5 - 8.1 g/dL Final   Albumin 65/78/4696 3.9  3.5 - 5.0  g/dL Final   AST 29/52/8413 17  15 - 41 U/L Final   ALT 11/21/2023 19  0 - 44 U/L Final   Alkaline Phosphatase 11/21/2023 80  38 - 126 U/L Final   Total Bilirubin 11/21/2023 0.4  0.0 - 1.2 mg/dL Final   GFR, Estimated 11/21/2023 >60  >60 mL/min Final   Comment: (NOTE) Calculated using the CKD-EPI Creatinine Equation (2021)    Anion gap 11/21/2023 13  5 - 15 Final   Performed at [facility], [address] [place], [place] [address]   Hgb A1c MFr Bld 11/21/2023 5.8 (H)  4.8 - 5.6 % Final   Comment: (NOTE)         Prediabetes: 5.7 - 6.4         Diabetes: >6.4         Glycemic control for adults with diabetes: <7.0    Mean Plasma Glucose 11/21/2023 120  mg/dL Final   Comment: (NOTE) Performed At: [facility] [address] [place], [place] [id number] Pearlean Botts MD QI:[id number]    Magnesium  11/21/2023 2.2  1.7 - 2.4 mg/dL Final   Performed at [facility], [address], [place], [place] [address]   Alcohol, Ethyl (B) 11/21/2023 <10  <10 mg/dL Final  Comment: (NOTE) Lowest detectable limit for serum alcohol is 10 mg/dL.  For medical purposes only. Performed at [facility], [address], [place], [place] [address]    Cholesterol 11/21/2023 205 (H)  0 - 200 mg/dL Final   Triglycerides 60/45/4098 99  <150 mg/dL Final   HDL 11/91/4782 63  >40 mg/dL Final   Total CHOL/HDL Ratio 11/21/2023 3.3  RATIO Final   VLDL 11/21/2023 20  0 - 40 mg/dL Final   LDL Cholesterol 11/21/2023 122 (H)  0 - 99 mg/dL Final   Comment:        Total Cholesterol/HDL:CHD Risk Coronary Heart Disease Risk Table                     Men   Women  1/2 Average Risk   3.4   3.3  Average Risk       5.0   4.4  2 X Average Risk   9.6   7.1  3 X Average Risk  23.4   11.0        Use the calculated Patient Ratio above and the CHD Risk Table to determine the patient's CHD Risk.        ATP III CLASSIFICATION (LDL):  <100     mg/dL   Optimal  956-213  mg/dL   Near or Above                     Optimal  130-159  mg/dL   Borderline  086-578  mg/dL   High  >469     mg/dL   Very High Performed at [facility], [address], [place], [place] [address]    TSH 11/21/2023 0.723  0.350 - 4.500 uIU/mL Final   Comment: Performed by a 3rd Generation assay with a functional sensitivity of <=0.01 uIU/mL. Performed at [facility], [address], [place], [place] [address]    Preg Test, Ur 11/21/2023 Negative  Negative Final   Color, Urine 11/21/2023 AMBER (A)  YELLOW Final   BIOCHEMICALS MAY BE AFFECTED BY COLOR   APPearance 11/21/2023 CLOUDY (A)  CLEAR Final   Specific Gravity, Urine 11/21/2023 1.021  1.005 - 1.030 Final   pH 11/21/2023 6.0  5.0 - 8.0 Final   Glucose, UA 11/21/2023 NEGATIVE  NEGATIVE mg/dL Final   Hgb urine dipstick 11/21/2023 NEGATIVE  NEGATIVE Final   Bilirubin Urine 11/21/2023 NEGATIVE  NEGATIVE Final   Ketones, ur 11/21/2023 NEGATIVE  NEGATIVE mg/dL Final   Protein, ur 44/10/270 30 (A)  NEGATIVE mg/dL Final   Nitrite 53/66/4403 NEGATIVE  NEGATIVE Final   Leukocytes,Ua 11/21/2023 SMALL (A)  NEGATIVE Final   RBC / HPF 11/21/2023 0-5  0 - 5 RBC/hpf Final   WBC, UA 11/21/2023 6-10  0 - 5 WBC/hpf Final   Bacteria, UA 11/21/2023 RARE (A)  NONE SEEN Final   Squamous Epithelial / HPF 11/21/2023 21-50  0 - 5 /HPF Final   Mucus 11/21/2023 PRESENT   Final   Performed at [facility], [address] [place], [place] [address]   POC Amphetamine UR 11/21/2023 None Detected  NONE DETECTED (Cut Off Level 1000 ng/mL) Final   POC Secobarbital (BAR) 11/21/2023 None Detected  NONE DETECTED (Cut Off Level 300 ng/mL) Final   POC Buprenorphine (BUP) 11/21/2023 None Detected  NONE DETECTED (Cut Off Level 10 ng/mL) Final   POC Oxazepam (BZO) 11/21/2023 None Detected  NONE DETECTED (Cut Off Level 300  ng/mL) Final   POC Cocaine UR 11/21/2023 Positive (A)  NONE DETECTED (Cut Off Level 300 ng/mL) Final   POC Methamphetamine UR 11/21/2023 None Detected   NONE DETECTED (Cut Off Level 1000 ng/mL) Final   POC Morphine 11/21/2023 None Detected  NONE DETECTED (Cut Off Level 300 ng/mL) Final   POC Methadone UR 11/21/2023 None Detected  NONE DETECTED (Cut Off Level 300 ng/mL) Final   POC Oxycodone UR 11/21/2023 None Detected  NONE DETECTED (Cut Off Level 100 ng/mL) Final   POC Marijuana UR 11/21/2023 None Detected  NONE DETECTED (Cut Off Level 50 ng/mL) Final   Vitamin B-12 11/21/2023 306  180 - 914 pg/mL Final   Comment: (NOTE) This assay is not validated for testing neonatal or myeloproliferative syndrome specimens for Vitamin B12 levels. Performed at [facility], [address], [place], [place] [address]    T3, Free 11/21/2023 3.4  2.0 - 4.4 pg/mL Final   Comment: (NOTE) Performed At: [facility] [address] [place], [place] [id number] Pearlean Botts MD [id number]    Free T4 11/21/2023 0.88  0.61 - 1.12 ng/dL Final   Comment: (NOTE) Biotin ingestion may interfere with free T4 tests. If the results are inconsistent with the TSH level, previous test results, or the clinical presentation, then consider biotin interference. If needed, order repeat testing after stopping biotin. Performed at [facility], [address], [place], [place] [address]    Preg Test, Ur 11/21/2023 NEGATIVE  NEGATIVE Final   Comment:        THE SENSITIVITY OF THIS METHODOLOGY IS >24 mIU/mL     Blood Alcohol level:  Lab Results  Component Value Date   ETH <10 11/21/2023   ETH <10 07/08/2023    Metabolic Disorder Labs: Lab Results  Component Value Date   HGBA1C 5.8 (H) 11/21/2023   MPG 120 11/21/2023   MPG 119.76 07/11/2023   Lab Results  Component Value Date   PROLACTIN 7.6 08/04/2022   Lab Results  Component Value Date   CHOL 191 02/12/2024   TRIG 78 02/12/2024   HDL 56 02/12/2024   CHOLHDL 3.4 02/12/2024   VLDL 16 02/12/2024   LDLCALC 119 (H) 02/12/2024   LDLCALC 122 (H) 11/21/2023    Therapeutic Lab  Levels: No results found for: "LITHIUM" No results found for: "VALPROATE" No results found for: "CBMZ"  Physical Findings   AIMS    Flowsheet Row [facility]  AIMS Total Score 0      AUDIT    Flowsheet Row ED from 02/09/2024 in [facility] Admission (Discharged[facility] Ad[facility] (Discharged[facility]  Alcohol Use Disorder Identification Test Final Score (AUDIT) 0 0 2      PHQ2-9    Flowsheet Row ED from 07/09/2023 in [facility] ED from 07/08/2023 in [facility] ED from 08/04/2022 in [facility]  PHQ-2 Total Score 2 2 4   PHQ-9 Total Score 8 7 10       Flowsheet Row ED [facility] 02/09/2024 in [facility] Most recent reading at 02/09/2024  4:07 PM ED from 02/09/2024 in [facility] Most recent reading at 02/09/2024  2:22 AM Admission (Discharged[facility] Most recent reading at 11/21/2023  5:40  PM  C-SSRS RISK CATEGORY Moderate Risk Low Risk Low Risk        Musculoskeletal  Strength & Muscle Tone: within normal limits Gait & Station: normal Patient leans: N/A  Psychiatric Specialty Exam  Presentation  General Appearance:  Casual  Eye Contact: Good  Speech: Normal Rate  Speech Volume: Normal  Handedness: Right   Mood and Affect  Mood: Euthymic  Affect: Flat   Thought Process  Thought Processes: Coherent  Descriptions of Associations:Intact  Orientation:Full (Time, Place and Person)  Thought Content:Rumination; Other (comment) (somatic preoccupation)  Diagnosis of Schizophrenia or Schizoaffective disorder in past: No    Hallucinations:Hallucinations:  None  Ideas of Reference:None  Suicidal Thoughts:Suicidal Thoughts: No  Homicidal Thoughts:Homicidal Thoughts: No   Sensorium  Memory: Immediate Fair; Recent Fair; Remote Poor  Judgment: Poor  Insight: Poor   Executive Functions  Concentration: Poor  Attention Span: Poor  Recall: [employer] of Knowledge: Fair  Language: Fair   Psychomotor Activity  Psychomotor Activity: Psychomotor Activity: Decreased   Assets  Assets: Leisure Time   Sleep  Sleep: Sleep: Good   No data recorded  Physical Exam  Physical Exam Constitutional:      Appearance: Normal appearance.  HENT:     Head: Normocephalic.  Eyes:     Extraocular Movements: Extraocular movements intact.  Pulmonary:     Effort: Pulmonary effort is normal.  Musculoskeletal:        General: Normal range of motion.     Cervical back: Normal range of motion.  Skin:    Comments: Healing wound to buttocks  Neurological:     General: No focal deficit present.     Mental Status: She is alert and oriented to person, place, and time.  Psychiatric:        Mood and Affect: Mood normal.        Behavior: Behavior normal.    Review of Systems  Constitutional:  Negative for chills and fever.  Respiratory:  Negative for shortness of breath.   Cardiovascular:        Rib pain  Gastrointestinal:  Negative for constipation, diarrhea, nausea and vomiting.  Genitourinary:  Negative for dysuria.  Musculoskeletal:  Negative for myalgias.  Skin:  Positive for itching.  Neurological:  Negative for dizziness, tremors and headaches.  Psychiatric/Behavioral:  Negative for hallucinations and suicidal ideas.    Blood pressure (!) 146/79, pulse 70, temperature 98.6 F (37 C), temperature source Oral, resp. rate 17, last menstrual period 11/18/2018, SpO2 100%. There is no height or weight on file to calculate BMI.  Treatment Plan Summary: Daily contact with patient to assess and evaluate symptoms and progress  in treatment and Medication management   At this point in time patient does not want to take any psychotropic medicine.  Will continue symptomatic treatment   HTN Restarted home amlodipine    R gluteal laceration Wound care and neomycin  ointment   Other PRNs: acetaminophen , 650 mg, Q6H PRN alum & mag hydroxide-simeth, 30 mL, Q4H PRN haloperidol , 5 mg, TID PRN  And diphenhydrAMINE , 50 mg, TID PRN haloperidol  lactate, 5 mg, TID PRN  And diphenhydrAMINE , 50 mg, TID PRN  And LORazepam , 2 mg, TID PRN haloperidol  lactate, 10 mg, TID PRN  And diphenhydrAMINE , 50 mg, TID PRN  And LORazepam , 2 mg, TID PRN hydrOXYzine , 25 mg, TID PRN magnesium  hydroxide, 30 mL, Daily PRN traZODone , 50 mg, QHS PRN       Disposition: pt expressed interest in residential rehabilitation, but will have  to have healed her buttocks wound to go. Tentative discharge for Friday   Floyce Hutching, MD 02/15/2024 4:06 PM

## 2024-02-15 NOTE — ED Notes (Signed)
 No distress reported or observed. Pt calmly watching TV in dayroom with other patients.

## 2024-02-15 NOTE — Care Management (Signed)
 [facility] Care Management   Writer met with the patient and discussed discharge planning.   Patient requests inpatient substance abuse treatment.  Writer discussed the challenged for placement due to her wound on her buttocks.   Writer referred patient to [facility], [facility] and [facility].

## 2024-02-15 NOTE — ED Notes (Signed)
 Prn tylenol  follow up- pt currently asleep in bed.

## 2024-02-15 NOTE — ED Notes (Signed)
 Pt administered all morning medications. Pt c/o 7/10 neck pain that started prior to admission- prn tylenol  given. Pt also complained of chest pain- provider notified. Pt denies si hi and avh.

## 2024-02-15 NOTE — ED Notes (Signed)
Patient is sleeping. Respirations equal and unlabored, skin warm and dry, NAD. No change in assessment or acuity. Routine safety checks conducted according to facility protocol. Will continue to monitor for safety.   

## 2024-02-15 NOTE — ED Notes (Signed)
 Patient observed/assessed at bedside lying in bed asleep. Patient alert and oriented to self and location. Pt  denies A/V/H. She denies having any thoughts/plan of self harm and harm towards others. Pt complained of pain near the wound on her buttocks but refused pain medication when offered. Fluid and snack offered. Patient states that appetite has been good throughout the day. Verbalizes no further complaints at this time. Will continue to monitor and support.

## 2024-02-15 NOTE — Group Note (Signed)
 Group Topic: Wellness  Group Date: 02/15/2024 Start Time: 1115 End Time: 1145 Facilitators: Emelia Hang  Department: [facility]  Number of Participants: 10  Group Focus: other Self Care Treatment Modality:  Psychoeducation Interventions utilized were support Purpose: reinforce self-care  Name: Alexandra Buckley Date of Birth: [birth year]  MR: [id number]    Level of Participation: minimal Quality of Participation: attentive Interactions with others: gave feedback Mood/Affect: appropriate Triggers (if applicable): NA Cognition: coherent/clear Progress: Moderate Response: Pt shares that she is grateful for her kids. Pt shares that her self care practice will be to garden with her mom.  Plan: follow-up needed  Patients Problems:  Patient Active Problem List   Diagnosis Date Noted   Stimulant use disorder 02/09/2024   Prediabetes 08/10/2023   H/O: CVA (cerebrovascular accident) 07/11/2023   Substance induced mood disorder (HCC) 07/09/2023   Cocaine use 05/20/2023   Acute CVA (cerebrovascular accident) (HCC) 05/19/2023   History of trichomoniasis 10/08/2022   Recurrent major depressive disorder, in remission (HCC) 08/31/2022   Psychophysiological insomnia 08/31/2022   Essential hypertension 08/11/2022   Chronic left shoulder pain 08/11/2022   Chronic bilateral low back pain without sciatica 08/11/2022   Mixed hyperlipidemia 08/11/2022   History of suicide attempt 08/05/2022   Tobacco use disorder 08/05/2022   Cocaine abuse with cocaine-induced mood disorder (HCC) 08/04/2022   MDD (major depressive disorder), recurrent severe, without psychosis (HCC) 03/02/2017

## 2024-02-16 DIAGNOSIS — F1410 Cocaine abuse, uncomplicated: Secondary | ICD-10-CM | POA: Diagnosis not present

## 2024-02-16 DIAGNOSIS — F1510 Other stimulant abuse, uncomplicated: Secondary | ICD-10-CM | POA: Diagnosis not present

## 2024-02-16 DIAGNOSIS — Z79899 Other long term (current) drug therapy: Secondary | ICD-10-CM | POA: Diagnosis not present

## 2024-02-16 DIAGNOSIS — I10 Essential (primary) hypertension: Secondary | ICD-10-CM | POA: Diagnosis not present

## 2024-02-16 MED ORDER — PANTOPRAZOLE SODIUM 40 MG PO TBEC
40.0000 mg | DELAYED_RELEASE_TABLET | Freq: Every day | ORAL | Status: DC
  Administered 2024-02-16 – 2024-02-17 (×2): 40 mg via ORAL
  Filled 2024-02-16 (×2): qty 1
  Filled 2024-02-16: qty 7

## 2024-02-16 NOTE — ED Notes (Signed)
 Patient was provided with lunch and juice

## 2024-02-16 NOTE — Group Note (Signed)
 Group Topic: Social Support  Group Date: 02/16/2024 Start Time: 1030 End Time: 1100 Facilitators: Benjamen Brand  Department: [facility]  Number of Participants: 4  Group Focus: Social Supports Treatment Modality:  Psychoeducation Interventions utilized were patient education and support Purpose: increase insight  Name: Alexandra Buckley Date of Birth: 1970-12-31  MR: [id number]    Level of Participation: active Quality of Participation: attentive, cooperative, and engaged Interactions with others: gave feedback Mood/Affect: appropriate and positive Triggers (if applicable): N/A Cognition: coherent/clear and logical Progress: Gaining insight Response: Patient shared that when she hears "social supports" she thinks about her family (son & daughter). Plan: patient will be encouraged to continue attending groups  Patients Problems:  Patient Active Problem List   Diagnosis Date Noted   Stimulant use disorder 02/09/2024   Prediabetes 08/10/2023   H/O: CVA (cerebrovascular accident) 07/11/2023   Substance induced mood disorder (HCC) 07/09/2023   Cocaine use 05/20/2023   Acute CVA (cerebrovascular accident) (HCC) 05/19/2023   History of trichomoniasis 10/08/2022   Recurrent major depressive disorder, in remission (HCC) 08/31/2022   Psychophysiological insomnia 08/31/2022   Essential hypertension 08/11/2022   Chronic left shoulder pain 08/11/2022   Chronic bilateral low back pain without sciatica 08/11/2022   Mixed hyperlipidemia 08/11/2022   History of suicide attempt 08/05/2022   Tobacco use disorder 08/05/2022   Cocaine abuse with cocaine-induced mood disorder (HCC) 08/04/2022   MDD (major depressive disorder), recurrent severe, without psychosis (HCC) 03/02/2017

## 2024-02-16 NOTE — ED Notes (Signed)
 Patient is A&O x 4, calm and cooperative with a euthymic mood. Patient denies SI, HI, AVH. Patient continues to endorse epigastric discomfort. Maalox, APAP administered with newly ordered Protonix r/t patient reports of belching and flatulence with discomfort. Patient out in day room, observed more active in socialization and participation in group activities.

## 2024-02-16 NOTE — Group Note (Signed)
 Group Topic: Positive Affirmations  Group Date: 02/16/2024 Start Time: 1655 End Time: 1715 Facilitators: Ardie Kras L, RN  Department: [facility]  Number of Participants: 9  Group Focus: affirmation, check in, and nursing group Treatment Modality:  Psychoeducation Interventions utilized were exploration, group exercise, mental fitness, reality testing, and support Purpose: enhance coping skills, explore maladaptive thinking, increase insight, regain self-worth, and reinforce self-care  Name: Alexandra Buckley Date of Birth: May 09, 1971  MR: [id number]    Level of Participation: active Quality of Participation: cooperative, engaged, offered feedback, and supportive Interactions with others: gave feedback Mood/Affect: appropriate and positive Triggers (if applicable): n/a Cognition: coherent/clear, goal directed, insightful, and logical Progress: Gaining insight Response: Patient talked about her relationship with her children. She endorsed being ready to get life in order and has a plan to accomplish that. Plan: follow-up needed  Patients Problems:  Patient Active Problem List   Diagnosis Date Noted   Stimulant use disorder 02/09/2024   Prediabetes 08/10/2023   H/O: CVA (cerebrovascular accident) 07/11/2023   Substance induced mood disorder (HCC) 07/09/2023   Cocaine use 05/20/2023   Acute CVA (cerebrovascular accident) (HCC) 05/19/2023   History of trichomoniasis 10/08/2022   Recurrent major depressive disorder, in remission (HCC) 08/31/2022   Psychophysiological insomnia 08/31/2022   Essential hypertension 08/11/2022   Chronic left shoulder pain 08/11/2022   Chronic bilateral low back pain without sciatica 08/11/2022   Mixed hyperlipidemia 08/11/2022   History of suicide attempt 08/05/2022   Tobacco use disorder 08/05/2022   Cocaine abuse with cocaine-induced mood disorder (HCC) 08/04/2022   MDD (major depressive disorder), recurrent severe,  without psychosis (HCC) 03/02/2017

## 2024-02-16 NOTE — ED Provider Notes (Signed)
 Behavioral Health Progress Note  Date and Time: 02/16/2024 2:16 PM Name: Alexandra Buckley MRN:  [id number]  Subjective:  Alexandra Buckley was seen in the common area on rounds. She had some broken sleep due to being a light sleeper. She is eating okay. She denies medication side effects. She is still having reflux and nausea. She has no new physical complaints. She denies hallucinations, thoughts of harm to self or others. She is engaged in discharge planning.   Diagnosis:  Final diagnoses:  Stimulant use disorder  Cocaine abuse (HCC)  Hypertension, unspecified type    Total Time spent with patient: 15 minutes  Past Psychiatric History:  Diagnoses: MDD, cocaine use d/o, substance induced mood d/o, tobacco use d/o, suicide attempt x2 (last time 08/2022), inpatient psych admission Medication trials: doesn't remember, as documented past trial of Zoloft  Hospitalizations: 2 prior psychiatric hospitalizations.  Last admission at [facility] from 11/21/2023-11/30/2023 for worsening substance use and SI. Suicide attempts: x2 - 08/2022, 2018 - ingestion of cleaning products   Substance Use History: Nicotine :  reports that she has quit smoking. Her smoking use included cigarettes. She has never used smokeless tobacco. IV drug use: Denied Stimulants: Cocaine, active, daily use DT: Denied Detox: Yes Residential: Yes, previously discharged from [facility] to [facility] in 07/14/2023 Past Medical History:  Dx:  has a past medical history of History of suicide attempt (08/05/2022) and Tobacco use disorder (08/05/2022).  History of CVA in Aug 2024 Allergies: Patient has no known allergies.   Sleep: Fair  Appetite:  Good  Current Medications:  Current Facility-Administered Medications  Medication Dose Route Frequency Provider Last Rate Last Admin   acetaminophen  (TYLENOL ) tablet 650 mg  650 mg Oral Q6H PRN Carrion-Carrero, Jacalyn Martin, MD   650 mg at 02/16/24 0844   alum & mag hydroxide-simeth (MAALOX/MYLANTA) 200-200-20 MG/5ML  suspension 30 mL  30 mL Oral Q4H PRN Carrion-Carrero, Jacalyn Martin, MD   30 mL at 02/16/24 0844   amLODipine  (NORVASC ) tablet 5 mg  5 mg Oral Daily Carrion-Carrero, Margely, MD   5 mg at 02/16/24 0844   cloNIDine  (CATAPRES ) tablet 0.1 mg  0.1 mg Oral Q6H PRN Carrion-Carrero, Jacalyn Martin, MD   0.1 mg at 02/10/24 2144   haloperidol  (HALDOL ) tablet 5 mg  5 mg Oral TID PRN Carrion-Carrero, Jacalyn Martin, MD       And   diphenhydrAMINE  (BENADRYL ) capsule 50 mg  50 mg Oral TID PRN Carrion-Carrero, Margely, MD       haloperidol  lactate (HALDOL ) injection 5 mg  5 mg Intramuscular TID PRN Carrion-Carrero, Margely, MD       And   diphenhydrAMINE  (BENADRYL ) injection 50 mg  50 mg Intramuscular TID PRN Carrion-Carrero, Margely, MD       And   LORazepam  (ATIVAN ) injection 2 mg  2 mg Intramuscular TID PRN Carrion-Carrero, Margely, MD       haloperidol  lactate (HALDOL ) injection 10 mg  10 mg Intramuscular TID PRN Carrion-Carrero, Margely, MD       And   diphenhydrAMINE  (BENADRYL ) injection 50 mg  50 mg Intramuscular TID PRN Carrion-Carrero, Margely, MD       And   LORazepam  (ATIVAN ) injection 2 mg  2 mg Intramuscular TID PRN Carrion-Carrero, Margely, MD       guaiFENesin -dextromethorphan (ROBITUSSIN DM) 100-10 MG/5ML syrup 10 mL  10 mL Oral Q4H PRN Bethea, Terrence C, MD   10 mL at 02/13/24 1708   hydrOXYzine  (ATARAX ) tablet 25 mg  25 mg Oral TID PRN Baltazar Bonier, MD   25 mg at  02/10/24 2144   magnesium  hydroxide (MILK OF MAGNESIA) suspension 30 mL  30 mL Oral Daily PRN Carrion-Carrero, Jacalyn Martin, MD   30 mL at 02/13/24 0958   pantoprazole (PROTONIX) EC tablet 40 mg  40 mg Oral Daily Sachiko Methot, Alonza Arthurs, MD   40 mg at 02/16/24 1021   traZODone  (DESYREL ) tablet 50 mg  50 mg Oral QHS PRN Baltazar Bonier, MD   50 mg at 02/12/24 2216   Current Outpatient Medications  Medication Sig Dispense Refill   amLODipine  (NORVASC ) 5 MG tablet Take 1 tablet (5 mg total) by mouth daily.     aspirin  EC 81 MG tablet  Take 1 tablet (81 mg total) by mouth at bedtime. Swallow whole. (Patient not taking: Reported on 02/09/2024) 30 tablet 0   atorvastatin  (LIPITOR) 40 MG tablet Take 1 tablet (40 mg total) by mouth at bedtime. (Patient not taking: Reported on 02/09/2024) 30 tablet 0   Vitamin D , Ergocalciferol , (DRISDOL ) 1.25 MG (50000 UNIT) CAPS capsule Take 1 capsule (50,000 Units total) by mouth every 7 (seven) days. (Patient not taking: Reported on 02/09/2024) 5 capsule 0    Labs  Lab Results:  Admission on 02/09/2024  Component Date Value Ref Range Status   WBC 02/12/2024 5.1  4.0 - 10.5 K/uL Final   RBC 02/12/2024 4.40  3.87 - 5.11 MIL/uL Final   Hemoglobin 02/12/2024 12.8  12.0 - 15.0 g/dL Final   HCT 78/29/5621 39.6  36.0 - 46.0 % Final   MCV 02/12/2024 90.0  80.0 - 100.0 fL Final   MCH 02/12/2024 29.1  26.0 - 34.0 pg Final   MCHC 02/12/2024 32.3  30.0 - 36.0 g/dL Final   RDW 30/86/5784 13.6  11.5 - 15.5 % Final   Platelets 02/12/2024 265  150 - 400 K/uL Final   nRBC 02/12/2024 0.0  0.0 - 0.2 % Final   Neutrophils Relative % 02/12/2024 40  % Final   Neutro Abs 02/12/2024 2.1  1.7 - 7.7 K/uL Final   Lymphocytes Relative 02/12/2024 52  % Final   Lymphs Abs 02/12/2024 2.6  0.7 - 4.0 K/uL Final   Monocytes Relative 02/12/2024 7  % Final   Monocytes Absolute 02/12/2024 0.4  0.1 - 1.0 K/uL Final   Eosinophils Relative 02/12/2024 1  % Final   Eosinophils Absolute 02/12/2024 0.1  0.0 - 0.5 K/uL Final   Basophils Relative 02/12/2024 0  % Final   Basophils Absolute 02/12/2024 0.0  0.0 - 0.1 K/uL Final   Immature Granulocytes 02/12/2024 0  % Final   Abs Immature Granulocytes 02/12/2024 0.01  0.00 - 0.07 K/uL Final   Performed at [facility], [address] [place], [place] [address]   Sodium 02/12/2024 143  135 - 145 mmol/L Final   Potassium 02/12/2024 3.9  3.5 - 5.1 mmol/L Final   Chloride 02/12/2024 106  98 - 111 mmol/L Final   CO2 02/12/2024 27  22 - 32 mmol/L Final   Glucose, Bld 02/12/2024 78   70 - 99 mg/dL Final   Glucose reference range applies only to samples taken after fasting for at least 8 hours.   BUN 02/12/2024 13  6 - 20 mg/dL Final   Creatinine, Ser 02/12/2024 0.76  0.44 - 1.00 mg/dL Final   Calcium  02/12/2024 8.8 (L)  8.9 - 10.3 mg/dL Final   Total Protein 52/84/1324 6.3 (L)  6.5 - 8.1 g/dL Final   Albumin 40/07/2724 3.0 (L)  3.5 - 5.0 g/dL Final   AST 36/64/4034  16  15 - 41 U/L Final   ALT 02/12/2024 19  0 - 44 U/L Final   Alkaline Phosphatase 02/12/2024 84  38 - 126 U/L Final   Total Bilirubin 02/12/2024 0.3  0.0 - 1.2 mg/dL Final   GFR, Estimated 02/12/2024 >60  >60 mL/min Final   Comment: (NOTE) Calculated using the CKD-EPI Creatinine Equation (2021)    Anion gap 02/12/2024 10  5 - 15 Final   Performed at [facility], [address] [place], [place] [address]   TSH 02/12/2024 1.979  0.350 - 4.500 uIU/mL Final   Comment: Performed by a 3rd Generation assay with a functional sensitivity of <=0.01 uIU/mL. Performed at [facility], [address], [place], [place] [address]    Cholesterol 02/12/2024 191  0 - 200 mg/dL Final   Triglycerides 82/95/6213 78  <150 mg/dL Final   HDL 08/65/7846 56  >40 mg/dL Final   Total CHOL/HDL Ratio 02/12/2024 3.4  RATIO Final   VLDL 02/12/2024 16  0 - 40 mg/dL Final   LDL Cholesterol 02/12/2024 119 (H)  0 - 99 mg/dL Final   Comment:        Total Cholesterol/HDL:CHD Risk Coronary Heart Disease Risk Table                     Men   Women  1/2 Average Risk   3.4   3.3  Average Risk       5.0   4.4  2 X Average Risk   9.6   7.1  3 X Average Risk  23.4   11.0        Use the calculated Patient Ratio above and the CHD Risk Table to determine the patient's CHD Risk.        ATP III CLASSIFICATION (LDL):  <100     mg/dL   Optimal  962-952  mg/dL   Near or Above                    Optimal  130-159  mg/dL   Borderline  841-324  mg/dL   High  >401     mg/dL   Very High Performed at [facility], [address], [place], [place] [address]   Admission on 02/09/2024, Discharged on 02/09/2024  Component Date Value Ref Range Status   Preg Test, Ur 02/09/2024 Negative  Negative Final   POC Amphetamine UR 02/09/2024 None Detected  NONE DETECTED (Cut Off Level 1000 ng/mL) Final   POC Secobarbital (BAR) 02/09/2024 None Detected  NONE DETECTED (Cut Off Level 300 ng/mL) Final   POC Buprenorphine (BUP) 02/09/2024 None Detected  NONE DETECTED (Cut Off Level 10 ng/mL) Final   POC Oxazepam (BZO) 02/09/2024 None Detected  NONE DETECTED (Cut Off Level 300 ng/mL) Final   POC Cocaine UR 02/09/2024 Positive (A)  NONE DETECTED (Cut Off Level 300 ng/mL) Final   POC Methamphetamine UR 02/09/2024 None Detected  NONE DETECTED (Cut Off Level 1000 ng/mL) Final   POC Morphine 02/09/2024 None Detected  NONE DETECTED (Cut Off Level 300 ng/mL) Final   POC Methadone UR 02/09/2024 None Detected  NONE DETECTED (Cut Off Level 300 ng/mL) Final   POC Oxycodone UR 02/09/2024 None Detected  NONE DETECTED (Cut Off Level 100 ng/mL) Final   POC Marijuana UR 02/09/2024 Positive (A)  NONE DETECTED (Cut Off Level 50 ng/mL) Final  Admission on 11/21/2023, Discharged on 11/29/2023  Component Date Value Ref Range  Status   Folate 11/24/2023 7.2  >5.9 ng/mL Final   Performed at [facility], [address], [place], [place] [address]   RPR Ser Ql 11/24/2023 NON REACTIVE  NON REACTIVE Final   Performed at [facility], [address] [place], [place] [address]   Vit D, 25-Hydroxy 11/24/2023 10.71 (L)  30 - 100 ng/mL Final   Comment: (NOTE) Vitamin D  deficiency has been defined by the Institute of Medicine  and an Endocrine Society practice guideline as a level of serum 25-OH  vitamin D  less than 20 ng/mL (1,2). The Endocrine Society went on to  further define vitamin D  insufficiency as a level between 21 and 29  ng/mL (2).  1. IOM (Institute of Medicine). 2010. Dietary reference intakes for  calcium  and D.  [place]: [employer]. 2. Holick MF, Binkley [place], Bischoff-Ferrari HA, et al. Evaluation,  treatment, and prevention of vitamin D  deficiency: an Endocrine  Society clinical practice guideline, JCEM. 2011 Jul; 96(7): 1911-30.  Performed at [facility], [address], [place], [place] [address]    Sodium 11/24/2023 142  135 - 145 mmol/L Final   Potassium 11/24/2023 3.9  3.5 - 5.1 mmol/L Final   Chloride 11/24/2023 110  98 - 111 mmol/L Final   CO2 11/24/2023 26  22 - 32 mmol/L Final   Glucose, Bld 11/24/2023 81  70 - 99 mg/dL Final   Glucose reference range applies only to samples taken after fasting for at least 8 hours.   BUN 11/24/2023 14  6 - 20 mg/dL Final   Creatinine, Ser 11/24/2023 0.62  0.44 - 1.00 mg/dL Final   Calcium  11/24/2023 9.0  8.9 - 10.3 mg/dL Final   GFR, Estimated 11/24/2023 >60  >60 mL/min Final   Comment: (NOTE) Calculated using the CKD-EPI Creatinine Equation (2021)    Anion gap 11/24/2023 6  5 - 15 Final   Performed at [facility], [address], [place], [place] [address]   Sed Rate 11/24/2023 11  0 - 22 mm/hr Final   Performed at [facility], [address], [place], [place] [address]   CRP 11/24/2023 0.8  <1.0 mg/dL Final   Performed at [facility], [address] [place], [place] [address]   ANA Ab, IFA 11/24/2023 Negative   Final   Comment: (NOTE)                                     Negative   <1:80                                     Borderline  1:80                                     Positive   >1:80 ICAP nomenclature: AC-0 For more information about Hep-2 cell patterns use ANApatterns.org, the official website for the International Consensus on Antinuclear Antibody (ANA) Patterns (ICAP). Performed At: [facility] [address] [place], [place] [id number] Pearlean Botts MD ZD:[id number]   Admission on 11/21/2023, Discharged on 11/21/2023  Component Date Value  Ref Range Status   WBC 11/21/2023 6.5  4.0 - 10.5 K/uL Final   RBC  11/21/2023 4.91  3.87 - 5.11 MIL/uL Final   Hemoglobin 11/21/2023 14.0  12.0 - 15.0 g/dL Final   HCT 16/07/9603 44.1  36.0 - 46.0 % Final   MCV 11/21/2023 89.8  80.0 - 100.0 fL Final   MCH 11/21/2023 28.5  26.0 - 34.0 pg Final   MCHC 11/21/2023 31.7  30.0 - 36.0 g/dL Final   RDW 54/06/8118 14.0  11.5 - 15.5 % Final   Platelets 11/21/2023 259  150 - 400 K/uL Final   nRBC 11/21/2023 0.0  0.0 - 0.2 % Final   Neutrophils Relative % 11/21/2023 61  % Final   Neutro Abs 11/21/2023 4.0  1.7 - 7.7 K/uL Final   Lymphocytes Relative 11/21/2023 31  % Final   Lymphs Abs 11/21/2023 2.0  0.7 - 4.0 K/uL Final   Monocytes Relative 11/21/2023 6  % Final   Monocytes Absolute 11/21/2023 0.4  0.1 - 1.0 K/uL Final   Eosinophils Relative 11/21/2023 1  % Final   Eosinophils Absolute 11/21/2023 0.1  0.0 - 0.5 K/uL Final   Basophils Relative 11/21/2023 1  % Final   Basophils Absolute 11/21/2023 0.0  0.0 - 0.1 K/uL Final   Immature Granulocytes 11/21/2023 0  % Final   Abs Immature Granulocytes 11/21/2023 0.02  0.00 - 0.07 K/uL Final   Performed at [facility], [address] [place], [place] [address]   Sodium 11/21/2023 146 (H)  135 - 145 mmol/L Final   Potassium 11/21/2023 3.7  3.5 - 5.1 mmol/L Final   Chloride 11/21/2023 104  98 - 111 mmol/L Final   CO2 11/21/2023 29  22 - 32 mmol/L Final   Glucose, Bld 11/21/2023 96  70 - 99 mg/dL Final   Glucose reference range applies only to samples taken after fasting for at least 8 hours.   BUN 11/21/2023 13  6 - 20 mg/dL Final   Creatinine, Ser 11/21/2023 0.80  0.44 - 1.00 mg/dL Final   Calcium  11/21/2023 9.3  8.9 - 10.3 mg/dL Final   Total Protein 95/62/1308 6.6  6.5 - 8.1 g/dL Final   Albumin 65/78/4696 3.9  3.5 - 5.0 g/dL Final   AST 29/52/8413 17  15 - 41 U/L Final   ALT 11/21/2023 19  0 - 44 U/L Final   Alkaline Phosphatase 11/21/2023 80  38 - 126 U/L Final   Total Bilirubin  11/21/2023 0.4  0.0 - 1.2 mg/dL Final   GFR, Estimated 11/21/2023 >60  >60 mL/min Final   Comment: (NOTE) Calculated using the CKD-EPI Creatinine Equation (2021)    Anion gap 11/21/2023 13  5 - 15 Final   Performed at [facility], [address], [place], [place] [address]   Hgb A1c MFr Bld 11/21/2023 5.8 (H)  4.8 - 5.6 % Final   Comment: (NOTE)         Prediabetes: 5.7 - 6.4         Diabetes: >6.4         Glycemic control for adults with diabetes: <7.0    Mean Plasma Glucose 11/21/2023 120  mg/dL Final   Comment: (NOTE) Performed At: [facility] [address] [place], [place] [id number] Pearlean Botts MD QI:[id number]    Magnesium  11/21/2023 2.2  1.7 - 2.4 mg/dL Final   Performed at [facility], [address] [place], [place] [address]   Alcohol, Ethyl (B) 11/21/2023 <10  <10 mg/dL Final   Comment: (NOTE) Lowest detectable limit for serum alcohol is  10 mg/dL.  For medical purposes only. Performed at [facility], [address], [place], [place] [address]    Cholesterol 11/21/2023 205 (H)  0 - 200 mg/dL Final   Triglycerides 60/45/4098 99  <150 mg/dL Final   HDL 11/91/4782 63  >40 mg/dL Final   Total CHOL/HDL Ratio 11/21/2023 3.3  RATIO Final   VLDL 11/21/2023 20  0 - 40 mg/dL Final   LDL Cholesterol 11/21/2023 122 (H)  0 - 99 mg/dL Final   Comment:        Total Cholesterol/HDL:CHD Risk Coronary Heart Disease Risk Table                     Men   Women  1/2 Average Risk   3.4   3.3  Average Risk       5.0   4.4  2 X Average Risk   9.6   7.1  3 X Average Risk  23.4   11.0        Use the calculated Patient Ratio above and the CHD Risk Table to determine the patient's CHD Risk.        ATP III CLASSIFICATION (LDL):  <100     mg/dL   Optimal  956-213  mg/dL   Near or Above                    Optimal  130-159  mg/dL   Borderline  086-578  mg/dL   High  >469     mg/dL   Very High Performed at [facility], [address],  [place], [place] [address]    TSH 11/21/2023 0.723  0.350 - 4.500 uIU/mL Final   Comment: Performed by a 3rd Generation assay with a functional sensitivity of <=0.01 uIU/mL. Performed at [facility], [address], [place], [place] [address]    Preg Test, Ur 11/21/2023 Negative  Negative Final   Color, Urine 11/21/2023 AMBER (A)  YELLOW Final   BIOCHEMICALS MAY BE AFFECTED BY COLOR   APPearance 11/21/2023 CLOUDY (A)  CLEAR Final   Specific Gravity, Urine 11/21/2023 1.021  1.005 - 1.030 Final   pH 11/21/2023 6.0  5.0 - 8.0 Final   Glucose, UA 11/21/2023 NEGATIVE  NEGATIVE mg/dL Final   Hgb urine dipstick 11/21/2023 NEGATIVE  NEGATIVE Final   Bilirubin Urine 11/21/2023 NEGATIVE  NEGATIVE Final   Ketones, ur 11/21/2023 NEGATIVE  NEGATIVE mg/dL Final   Protein, ur 44/10/270 30 (A)  NEGATIVE mg/dL Final   Nitrite 53/66/4403 NEGATIVE  NEGATIVE Final   Leukocytes,Ua 11/21/2023 SMALL (A)  NEGATIVE Final   RBC / HPF 11/21/2023 0-5  0 - 5 RBC/hpf Final   WBC, UA 11/21/2023 6-10  0 - 5 WBC/hpf Final   Bacteria, UA 11/21/2023 RARE (A)  NONE SEEN Final   Squamous Epithelial / HPF 11/21/2023 21-50  0 - 5 /HPF Final   Mucus 11/21/2023 PRESENT   Final   Performed at [facility], [address], [place], [place] [address]   POC Amphetamine UR 11/21/2023 None Detected  NONE DETECTED (Cut Off Level 1000 ng/mL) Final   POC Secobarbital (BAR) 11/21/2023 None Detected  NONE DETECTED (Cut Off Level 300 ng/mL) Final   POC Buprenorphine (BUP) 11/21/2023 None Detected  NONE DETECTED (Cut Off Level 10 ng/mL) Final   POC Oxazepam (BZO) 11/21/2023 None Detected  NONE DETECTED (Cut Off Level 300 ng/mL) Final   POC Cocaine UR 11/21/2023 Positive (  A)  NONE DETECTED (Cut Off Level 300 ng/mL) Final   POC Methamphetamine UR 11/21/2023 None Detected  NONE DETECTED (Cut Off Level 1000 ng/mL) Final   POC Morphine 11/21/2023 None Detected  NONE DETECTED (Cut Off Level 300 ng/mL) Final   POC Methadone UR  11/21/2023 None Detected  NONE DETECTED (Cut Off Level 300 ng/mL) Final   POC Oxycodone UR 11/21/2023 None Detected  NONE DETECTED (Cut Off Level 100 ng/mL) Final   POC Marijuana UR 11/21/2023 None Detected  NONE DETECTED (Cut Off Level 50 ng/mL) Final   Vitamin B-12 11/21/2023 306  180 - 914 pg/mL Final   Comment: (NOTE) This assay is not validated for testing neonatal or myeloproliferative syndrome specimens for Vitamin B12 levels. Performed at [facility], [address], [place], [place] [address]    T3, Free 11/21/2023 3.4  2.0 - 4.4 pg/mL Final   Comment: (NOTE) Performed At: [facility] [address] [place], [place] [id number] Pearlean Botts MD XB:[id number]    Free T4 11/21/2023 0.88  0.61 - 1.12 ng/dL Final   Comment: (NOTE) Biotin ingestion may interfere with free T4 tests. If the results are inconsistent with the TSH level, previous test results, or the clinical presentation, then consider biotin interference. If needed, order repeat testing after stopping biotin. Performed at [facility], [address] [place], [place] [address]    Preg Test, Ur 11/21/2023 NEGATIVE  NEGATIVE Final   Comment:        THE SENSITIVITY OF THIS METHODOLOGY IS >24 mIU/mL     Blood Alcohol level:  Lab Results  Component Value Date   ETH <10 11/21/2023   ETH <10 07/08/2023    Metabolic Disorder Labs: Lab Results  Component Value Date   HGBA1C 5.8 (H) 11/21/2023   MPG 120 11/21/2023   MPG 119.76 07/11/2023   Lab Results  Component Value Date   PROLACTIN 7.6 08/04/2022   Lab Results  Component Value Date   CHOL 191 02/12/2024   TRIG 78 02/12/2024   HDL 56 02/12/2024   CHOLHDL 3.4 02/12/2024   VLDL 16 02/12/2024   LDLCALC 119 (H) 02/12/2024   LDLCALC 122 (H) 11/21/2023    Therapeutic Lab Levels: No results found for: "LITHIUM" No results found for: "VALPROATE" No results found for: "CBMZ"  Physical Findings   AIMS    Flowsheet Row  [facility]  AIMS Total Score 0      AUDIT    Flowsheet Row ED [facility] 02/09/2024 in [facility] Admission (Discharged[facility] Ad[facility] (Discharged[facility]  Alcohol Use Disorder Identification Test Final Score (AUDIT) 0 0 2      PHQ2-9    Flowsheet Row ED from 07/09/2023 in [facility] ED from 07/08/2023 in [facility] ED from 08/04/2022 in [place]  PHQ-2 Total Score 2 2 4   PHQ-9 Total Score 8 7 10       Flowsheet Row ED from 02/09/2024 in [facility] Most recent reading at 02/09/2024  4:07 PM ED from 02/09/2024 in [facility] Most recent reading at 02/09/2024  2:22 AM Admission (Discharged) [facility] Most recent reading at 11/21/2023  5:40 PM  C-SSRS RISK CATEGORY Moderate Risk Low Risk  Low Risk        Musculoskeletal  Strength & Muscle Tone: within normal limits Gait & Station: normal Patient leans: N/A  Psychiatric Specialty Exam  Presentation  General Appearance:  Appropriate for Environment  Eye Contact: Fair  Speech: Normal Rate  Speech Volume: Normal  Handedness: Right   Mood and Affect  Mood: Euthymic  Affect: Appropriate   Thought Process  Thought Processes: Linear  Descriptions of Associations:Intact  Orientation:Full (Time, Place and Person)  Thought Content:Logical  Diagnosis of Schizophrenia or Schizoaffective disorder in past: No    Hallucinations:Hallucinations: None  Ideas of Reference:None  Suicidal Thoughts:Suicidal Thoughts: No  Homicidal Thoughts:Homicidal Thoughts: No   Sensorium  Memory: Immediate Fair; Remote Poor;  Recent Fair  Judgment: Fair  Insight: Fair   Art therapist  Concentration: Fair  Attention Span: Fair  Recall: [employer] of Knowledge: Fair  Language: Fair   Psychomotor Activity  Psychomotor Activity: Psychomotor Activity: Normal   Assets  Assets: Desire for Improvement; Leisure Time   Sleep  Sleep: Sleep: Good   No data recorded  Physical Exam  Physical Exam Vitals and nursing note reviewed.  Constitutional:      Appearance: Normal appearance.  HENT:     Head: Normocephalic.  Eyes:     Extraocular Movements: Extraocular movements intact.  Pulmonary:     Effort: Pulmonary effort is normal.  Musculoskeletal:        General: Normal range of motion.     Cervical back: Normal range of motion.  Skin:    Comments: Buttocks wound, healing  Neurological:     General: No focal deficit present.     Mental Status: She is alert and oriented to person, place, and time.  Psychiatric:        Behavior: Behavior normal.    Review of Systems  Constitutional:  Negative for chills and fever.  Respiratory:  Negative for cough.   Gastrointestinal:  Positive for heartburn and nausea. Negative for constipation, diarrhea and vomiting.  Genitourinary:  Negative for dysuria.  Musculoskeletal:  Positive for myalgias.  Psychiatric/Behavioral:  Negative for hallucinations and suicidal ideas.    Blood pressure (!) 160/74, pulse 71, temperature 98.6 F (37 C), temperature source Oral, resp. rate 18, last menstrual period 11/18/2018, SpO2 100%. There is no height or weight on file to calculate BMI.  Treatment Plan Summary: Daily contact with patient to assess and evaluate symptoms and progress in treatment and Medication management   Patient currently declines psychotropic medications.    HTN Restarted home amlodipine    R gluteal laceration Wound care and neomycin  ointment  Nausea and reflux: Started protonix 40mg  PO daily   Other  PRNs: acetaminophen , 650 mg, Q6H PRN alum & mag hydroxide-simeth, 30 mL, Q4H PRN haloperidol , 5 mg, TID PRN  And diphenhydrAMINE , 50 mg, TID PRN haloperidol  lactate, 5 mg, TID PRN  And diphenhydrAMINE , 50 mg, TID PRN  And LORazepam , 2 mg, TID PRN haloperidol  lactate, 10 mg, TID PRN  And diphenhydrAMINE , 50 mg, TID PRN  And LORazepam , 2 mg, TID PRN hydrOXYzine , 25 mg, TID PRN magnesium  hydroxide, 30 mL, Daily PRN traZODone , 50 mg, QHS PRN     Disposition: pt expressed interest in residential rehabilitation, but will have to have healed her buttocks wound to go. Tentative discharge for Friday  Floyce Hutching, MD 02/16/2024 2:16 PM

## 2024-02-16 NOTE — ED Notes (Signed)
 Patient was provided with dinner and juice

## 2024-02-16 NOTE — ED Notes (Signed)
 Pt is in the dayroom watching TV with peers. Pt denies SI/HI/AVH. Pt has no further complain.No acute distress noted. Will continue to monitor for safety and provide support.

## 2024-02-16 NOTE — ED Notes (Signed)
 Patient is sleeping. Respirations equal and unlabored, skin warm and dry. No change in assessment or acuity. Routine safety checks conducted according to facility protocol. Will continue to monitor for safety.

## 2024-02-17 DIAGNOSIS — Z79899 Other long term (current) drug therapy: Secondary | ICD-10-CM | POA: Diagnosis not present

## 2024-02-17 DIAGNOSIS — F1410 Cocaine abuse, uncomplicated: Secondary | ICD-10-CM | POA: Diagnosis not present

## 2024-02-17 DIAGNOSIS — I10 Essential (primary) hypertension: Secondary | ICD-10-CM | POA: Diagnosis not present

## 2024-02-17 DIAGNOSIS — F1510 Other stimulant abuse, uncomplicated: Secondary | ICD-10-CM | POA: Diagnosis not present

## 2024-02-17 LAB — URINALYSIS, ROUTINE W REFLEX MICROSCOPIC
Bilirubin Urine: NEGATIVE
Glucose, UA: NEGATIVE mg/dL
Hgb urine dipstick: NEGATIVE
Ketones, ur: NEGATIVE mg/dL
Nitrite: NEGATIVE
Protein, ur: NEGATIVE mg/dL
Specific Gravity, Urine: 1.019 (ref 1.005–1.030)
pH: 5 (ref 5.0–8.0)

## 2024-02-17 MED ORDER — PANTOPRAZOLE SODIUM 40 MG PO TBEC: 40.0000 mg | DELAYED_RELEASE_TABLET | Freq: Every day | ORAL | 0 refills | Status: DC

## 2024-02-17 MED ORDER — TRAZODONE HCL 50 MG PO TABS: 50.0000 mg | ORAL_TABLET | Freq: Every evening | ORAL | 0 refills | Status: DC | PRN

## 2024-02-17 MED ORDER — HYDROXYZINE HCL 25 MG PO TABS
25.0000 mg | ORAL_TABLET | Freq: Three times a day (TID) | ORAL | 0 refills | Status: AC | PRN
Start: 2024-02-17 — End: ?

## 2024-02-17 NOTE — ED Provider Notes (Incomplete)
 [facility] ASAP Discharge Summary  Date and Time: 02/18/2024 10:43 AM  Name: Alexandra Buckley  MRN:  [id number]   Discharge Diagnoses:  Final diagnoses:  Stimulant use disorder  Cocaine abuse (HCC)  Hypertension, unspecified type    Subjective: 02/09/24 HPI:  Alexandra Buckley, 53 y/o female with a history of MDD, cocaine abuse, polysubstance abuse, SI, presented to [facility] voluntarily.  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  I review of patient records show a history of cocaine abuse and homelessness.  Patient is currently not seeing a psychiatrist or therapist.  Patient is requesting detox from cocaine use, last use prior to [facility], with hopeful transition to residential rehabilitation to maintain sobriety.   The patient reports depression and suicidal ideations in the setting of ongoing cocaine use.  She reports her substance use is driven by her homelessness and limited social support.  She has been homeless for the past 2 months, and had previously been living with an ex boyfriend.  The patient reports limited social support, she has an adult son who is 39 years old and lives in the [place] area but is not in contact with him.  She reports suicidal ideations, stating "I could not take it anymore".  She denies any specific intent or plan on interview, is primarily motivated to complete detox from cocaine and transition to residential rehabilitation.  She is amenable to transfer to [facility].  Patient is motivated to get sober as stated "I want to get my life back together, I want to be better for myself".   Patient's depressive symptoms are characterized by low mood, anhedonia, fatigue, feelings of hopelessness.   The patient has no pending legal charges or upcoming court dates.  She reports no access to firearms.  Stay Summary: During the course  of patient's hospitalization, the 15-minute checks were adequate to ensure patient's safety. Patient did not exhibit erratic or aggressive behavior and was compliant with scheduled medication. Patient was recommended for outpatient psychiatry follow-up.  At the time of discharge patient is not reporting any acute suicidal/homicidal ideations/AVH, delusional thoughts or paranoia. Patient did not appear to be responding to any internal stimuli. Patient feels more confident about self-care & in managing their mental health problems. Patient currently denies any new issues or concerns. Education and supportive counseling provided throughout patient's hospital stay & upon discharge.  patient was safely detoxed fom stimulants during the course of her stay, and any symptoms of withdrawal were addressed and have since resolved. At time of discharge the patient is not experiencing clinical syptoms of withdrawal and denies having any subjective symptoms. At time of discharge CIWA was 0.   Today upon discharge evaluation, the patient gives a mood of neutral. Patient denies any specific concerns and has no new physical complaints. Patient slept well, appetite good, regular bowel movements. Patient feels that the medications have been helpful & is in agreement to continue current treatment regimen as recommended. Patient was able to engage in safety planning including plan to return to [facility] the nearest emergency room or contact emergency services if patient feels unable to maintain their own safety or the safety of others. Patient had no further questions, comments, or concerns. Patient left [facility] care unit [facility]) with all personal belongings in no apparent distress. Transportation per safe transport  to home was arranged for patient.  Late lab result + Trichomonas, called in to the pharmacy and communicated to RN [place] [facility]. Also resumed Norvasc  5mg  PO daily disp 30, no  refills.   Total Time spent with patient: 15 minutes  Past Psychiatric History: Diagnoses: MDD, cocaine use d/o, substance induced mood d/o, tobacco use d/o, suicide attempt x2 (last time 08/2022), inpatient psych admission Medication trials: doesn't remember, as documented past trial of Zoloft  Hospitalizations: 2 prior psychiatric hospitalizations.  Last admission at [facility] from 11/21/2023-11/30/2023 for worsening substance use and SI. Suicide attempts: x2 - 08/2022, 2018 - ingestion of cleaning products   Substance Use History: Nicotine :  reports that she has quit smoking. Her smoking use included cigarettes. She has never used smokeless tobacco. IV drug use: Denied Stimulants: Cocaine, active, daily use DT: Denied Detox: Yes Residential: Yes, previously discharged from [facility] to [facility] in 07/14/2023 Past Medical History:  Dx:  has a past medical history of History of suicide attempt (08/05/2022) and Tobacco use disorder (08/05/2022).  History of CVA in Aug 2024 Allergies: Patient has no known allergies.  Family History: unknown Family Psychiatric History obtained from chart review:  Brother, Sister- Crack Cocaine Abuse No Known Diagnosis' or Suicides Social History:  Housing: homeless Income: unemployed Family: daughter, grandchildren Tobacco Cessation:  N/A, patient does not currently use tobacco products  Current Medications:  Current Facility-Administered Medications  Medication Dose Route Frequency Provider Last Rate Last Admin   acetaminophen  (TYLENOL ) tablet 650 mg  650 mg Oral Q6H PRN Carrion-Carrero, Jacalyn Martin, MD   650 mg at 02/17/24 2109   alum & mag hydroxide-simeth (MAALOX/MYLANTA) 200-200-20 MG/5ML suspension 30 mL  30 mL Oral Q4H PRN Carrion-Carrero, Jacalyn Martin, MD   30 mL at 02/16/24 0844   amLODipine  (NORVASC ) tablet 5 mg  5 mg Oral Daily Carrion-Carrero, Margely, MD   5 mg at 02/17/24 1001   cloNIDine  (CATAPRES ) tablet 0.1 mg  0.1 mg Oral Q6H PRN Carrion-Carrero, Jacalyn Martin, MD    0.1 mg at 02/17/24 2218   haloperidol  (HALDOL ) tablet 5 mg  5 mg Oral TID PRN Carrion-Carrero, Jacalyn Martin, MD       And   diphenhydrAMINE  (BENADRYL ) capsule 50 mg  50 mg Oral TID PRN Carrion-Carrero, Margely, MD       haloperidol  lactate (HALDOL ) injection 5 mg  5 mg Intramuscular TID PRN Carrion-Carrero, Margely, MD       And   diphenhydrAMINE  (BENADRYL ) injection 50 mg  50 mg Intramuscular TID PRN Carrion-Carrero, Margely, MD       And   LORazepam  (ATIVAN ) injection 2 mg  2 mg Intramuscular TID PRN Carrion-Carrero, Margely, MD       haloperidol  lactate (HALDOL ) injection 10 mg  10 mg Intramuscular TID PRN Carrion-Carrero, Margely, MD       And   diphenhydrAMINE  (BENADRYL ) injection 50 mg  50 mg Intramuscular TID PRN Carrion-Carrero, Margely, MD       And   LORazepam  (ATIVAN ) injection 2 mg  2 mg Intramuscular TID PRN Carrion-Carrero, Margely, MD       guaiFENesin -dextromethorphan (ROBITUSSIN DM) 100-10 MG/5ML syrup 10 mL  10 mL Oral Q4H PRN Bethea, Terrence C, MD   10 mL at 02/13/24 1708   hydrOXYzine  (ATARAX ) tablet 25 mg  25 mg Oral TID PRN Baltazar Bonier, MD   25 mg at 02/17/24 2109   magnesium  hydroxide (MILK OF MAGNESIA) suspension 30 mL  30 mL Oral Daily PRN Baltazar Bonier, MD   30 mL at 02/13/24 [phone]  pantoprazole (PROTONIX) EC tablet 40 mg  40 mg Oral Daily Pualani Borah, Alonza Arthurs, MD   40 mg at 02/17/24 1001   traZODone  (DESYREL ) tablet 50 mg  50 mg Oral QHS PRN Baltazar Bonier, MD   50 mg at 02/17/24 2109   Current Outpatient Medications  Medication Sig Dispense Refill   aspirin  EC 81 MG tablet Take 1 tablet (81 mg total) by mouth at bedtime. Swallow whole. (Patient not taking: Reported on 02/09/2024) 30 tablet 0   hydrOXYzine  (ATARAX ) 25 MG tablet Take 1 tablet (25 mg total) by mouth 3 (three) times daily as needed for anxiety. 30 tablet 0   pantoprazole (PROTONIX) 40 MG tablet Take 1 tablet (40 mg total) by mouth daily. 30 tablet 0   traZODone  (DESYREL )  50 MG tablet Take 1 tablet (50 mg total) by mouth at bedtime as needed for sleep. 30 tablet 0    PTA Medications:  PTA Medications  Medication Sig   aspirin  EC 81 MG tablet Take 1 tablet (81 mg total) by mouth at bedtime. Swallow whole. (Patient not taking: Reported on 02/09/2024)   pantoprazole (PROTONIX) 40 MG tablet Take 1 tablet (40 mg total) by mouth daily.   hydrOXYzine  (ATARAX ) 25 MG tablet Take 1 tablet (25 mg total) by mouth 3 (three) times daily as needed for anxiety.   traZODone  (DESYREL ) 50 MG tablet Take 1 tablet (50 mg total) by mouth at bedtime as needed for sleep.   Facility Ordered Medications  Medication   acetaminophen  (TYLENOL ) tablet 650 mg   alum & mag hydroxide-simeth (MAALOX/MYLANTA) 200-200-20 MG/5ML suspension 30 mL   magnesium  hydroxide (MILK OF MAGNESIA) suspension 30 mL   haloperidol  (HALDOL ) tablet 5 mg   And   diphenhydrAMINE  (BENADRYL ) capsule 50 mg   haloperidol  lactate (HALDOL ) injection 5 mg   And   diphenhydrAMINE  (BENADRYL ) injection 50 mg   And   LORazepam  (ATIVAN ) injection 2 mg   haloperidol  lactate (HALDOL ) injection 10 mg   And   diphenhydrAMINE  (BENADRYL ) injection 50 mg   And   LORazepam  (ATIVAN ) injection 2 mg   hydrOXYzine  (ATARAX ) tablet 25 mg   traZODone  (DESYREL ) tablet 50 mg   amLODipine  (NORVASC ) tablet 5 mg   [COMPLETED] neomycin -bacitracin -polymyxin [phone] OINT 1 Application   [COMPLETED] cloNIDine  (CATAPRES ) tablet 0.1 mg   cloNIDine  (CATAPRES ) tablet 0.1 mg   [COMPLETED] amLODipine  (NORVASC ) tablet 5 mg   guaiFENesin -dextromethorphan (ROBITUSSIN DM) 100-10 MG/5ML syrup 10 mL   pantoprazole (PROTONIX) EC tablet 40 mg       07/12/2023    3:13 PM 07/09/2023   10:38 AM 08/06/2022    2:06 PM  Depression screen PHQ 2/9  Decreased Interest 1 1 3   Down, Depressed, Hopeless 1 1 1   PHQ - 2 Score 2 2 4   Altered sleeping 2 1 0  Tired, decreased energy 1 1 0  Change in appetite 0 0 1  Feeling bad or failure about yourself   1 1 1   Trouble concentrating 1 1   Moving slowly or fidgety/restless 1 1 2   Suicidal thoughts 0 0 2  PHQ-9 Score 8 7 10   Difficult doing work/chores Very difficult Very difficult Somewhat difficult    Flowsheet Row ED from 02/09/2024 in [facility] Most recent reading at 02/09/2024  4:07 PM ED from 02/09/2024 in [facility] Most recent reading at 02/09/2024  2:22 AM [facility] (Dis[facility] Most recent reading at 11/21/2023  5:40 PM  C-SSRS RISK CATEGORY Moderate Risk Low Risk Low Risk       Musculoskeletal  Strength & Muscle Tone: within normal limits Gait & Station: normal Patient leans: N/A  Psychiatric Specialty Exam  Presentation  General Appearance:  Appropriate for Environment  Eye Contact: Good  Speech: Normal Rate  Speech Volume: Normal  Handedness: Right   Mood and Affect  Mood: Euthymic  Affect: Appropriate   Thought Process  Thought Processes: Linear  Descriptions of Associations:Intact  Orientation:Full (Time, Place and Person)  Thought Content:Logical  Diagnosis of Schizophrenia or Schizoaffective disorder in past: No    Hallucinations:Hallucinations: None  Ideas of Reference:None  Suicidal Thoughts:Suicidal Thoughts: No  Homicidal Thoughts:Homicidal Thoughts: No   Sensorium  Memory: Immediate Fair; Recent Fair; Remote Fair  Judgment: Fair  Insight: Fair   Art therapist  Concentration: Fair  Attention Span: Fair  Recall: Fair  Fund of Knowledge: Fair  Language: Good   Psychomotor Activity  Psychomotor Activity: Psychomotor Activity: Normal   Assets  Assets: Desire for Improvement; Leisure Time   Sleep  Sleep: Sleep: Fair   No data recorded  Physical Exam  Physical Exam Vitals and nursing note reviewed.  Constitutional:      Appearance: Normal appearance.  HENT:     Head:  Normocephalic.  Eyes:     Extraocular Movements: Extraocular movements intact.  Pulmonary:     Effort: Pulmonary effort is normal.  Musculoskeletal:        General: Normal range of motion.     Cervical back: Normal range of motion.  Neurological:     General: No focal deficit present.     Mental Status: She is alert and oriented to person, place, and time.    Review of Systems  Constitutional:  Negative for chills and fever.  Genitourinary:  Negative for dysuria.  Musculoskeletal:  Positive for joint pain and myalgias.   Blood pressure (!) 159/72, pulse 72, temperature 98.2 F (36.8 C), temperature source Oral, resp. rate 15, last menstrual period 11/18/2018, SpO2 97%. There is no height or weight on file to calculate BMI.  Demographic Factors:  Low socioeconomic status and Unemployed  Loss Factors: Financial problems/change in socioeconomic status  Historical Factors: Prior suicide attempts and Family history of mental illness or substance abuse  Risk Reduction Factors:   Positive coping skills or problem solving skills  Continued Clinical Symptoms:  Alcohol/Substance Abuse/Dependencies  Cognitive Features That Contribute To Risk:  None    Suicide Risk:  Mild:  Suicidal ideation of limited frequency, intensity, duration, and specificity.  There are no identifiable plans, no associated intent, mild dysphoria and related symptoms, good self-control (both objective and subjective assessment), few other risk factors, and identifiable protective factors, including available and accessible social support.  Plan Of Care/Follow-up recommendations:  Activity:  as tolerated Diet:  low sodium, low fat Prescriptions for new medications provided for the patient to bridge to follow up appointment. The patient was informed that refills for these prescriptions are generally not provided, and patient is encouraged to attend all follow up appointments to address medication refills and  adjustments.   Today's discharge was reviewed with treatment team, and the team is in agreement that the patient is ready for discharge. The patient is was of the discharge plan for today and has been given opportunity to ask questions. At time of discharge, the patient does not vocalize any acute harm to self or others, is goal directed, able to advocate for self and  organizational baseline.   At discharge, the patient is instructed to:  Take all medications as prescribed. Report any adverse effects and or reactions from the medicines to her outpatient provider promptly.  Do not engage in alcohol and/or illegal drug use while on prescription medicines.  In the event of worsening symptoms, patient is instructed to call the crisis hotline, 911 and or go to the nearest ED for appropriate evaluation and treatment of symptoms.  Follow-up with primary care provider for further care of medical issues, concerns and or health care needs. * Substance abuse follow up: it is recommended that you follow up with community support treatment, like AA/NA. It is also recommended that the patient attend 90 meetings in 90 days, otherwise known as "90 in 90"   Dis[facility]: [facility]  Floyce Hutching, MD 02/18/2024, 10:43 AM

## 2024-02-17 NOTE — Discharge Instructions (Addendum)
 Patient has been accepted and can transfer to the facility on 02/09/24 by 9:00am. Update has been provided to the patient and MD made aware. Patient will need a 7-14 day supply of medication and one month refill. No nicotine  gum allowed, however 14-30 day nicotine  patches to be provided if needed. No other needs to report at this time.   Patient will be discharging to [facility] and will need cab to arrive at 8:00 am with transportation provided via Taxi. Address is [address] [place], [place] [address]. Number to call for emergency is [phone]   Liam Redhead, LCSW Clinical Social Worker [name] Ph: [phone]

## 2024-02-17 NOTE — ED Provider Notes (Signed)
 Behavioral Health Progress Note  Date and Time: 02/17/2024 4:52 PM Name: Alexandra Buckley MRN:  [id number]  Subjective:  Alexandra Buckley was seen today in the garden area. She was in good spirits, but did have some complaints of ongoing rib pain, constipation and now vaginal odor/discharge. She was uncertain about the nature of this but was agreeable to a UA to investigate. She didn't sleep well overnight. She is eating okay. No hallucinations, thoughts of harm to self or others  Diagnosis:  Final diagnoses:  Stimulant use disorder  Cocaine abuse (HCC)  Hypertension, unspecified type    Total Time spent with patient: 20 minutes  Diagnoses: MDD, cocaine use d/o, substance induced mood d/o, tobacco use d/o, suicide attempt x2 (last time 08/2022), inpatient psych admission Medication trials: doesn't remember, as documented past trial of Zoloft  Hospitalizations: 2 prior psychiatric hospitalizations.  Last admission at [facility] from 11/21/2023-11/30/2023 for worsening substance use and SI. Suicide attempts: x2 - 08/2022, 2018 - ingestion of cleaning products   Substance Use History: Nicotine :  reports that she has quit smoking. Her smoking use included cigarettes. She has never used smokeless tobacco. IV drug use: Denied Stimulants: Cocaine, active, daily use DT: Denied Detox: Yes Residential: Yes, previously discharged from [facility] to [facility] in 07/14/2023 Past Medical History:  Dx:  has a past medical history of History of suicide attempt (08/05/2022) and Tobacco use disorder (08/05/2022).  History of CVA in Aug 2024 Allergies: Patient has no known allergies.   Sleep: Poor  Appetite:  Good  Current Medications:  Current Facility-Administered Medications  Medication Dose Route Frequency Provider Last Rate Last Admin   acetaminophen  (TYLENOL ) tablet 650 mg  650 mg Oral Q6H PRN Carrion-Carrero, Jacalyn Martin, MD   650 mg at 02/16/24 2127   alum & mag hydroxide-simeth (MAALOX/MYLANTA) 200-200-20 MG/5ML  suspension 30 mL  30 mL Oral Q4H PRN Carrion-Carrero, Jacalyn Martin, MD   30 mL at 02/16/24 0844   amLODipine  (NORVASC ) tablet 5 mg  5 mg Oral Daily Carrion-Carrero, Margely, MD   5 mg at 02/17/24 1001   cloNIDine  (CATAPRES ) tablet 0.1 mg  0.1 mg Oral Q6H PRN Carrion-Carrero, Jacalyn Martin, MD   0.1 mg at 02/16/24 2241   haloperidol  (HALDOL ) tablet 5 mg  5 mg Oral TID PRN Carrion-Carrero, Jacalyn Martin, MD       And   diphenhydrAMINE  (BENADRYL ) capsule 50 mg  50 mg Oral TID PRN Carrion-Carrero, Margely, MD       haloperidol  lactate (HALDOL ) injection 5 mg  5 mg Intramuscular TID PRN Carrion-Carrero, Margely, MD       And   diphenhydrAMINE  (BENADRYL ) injection 50 mg  50 mg Intramuscular TID PRN Carrion-Carrero, Margely, MD       And   LORazepam  (ATIVAN ) injection 2 mg  2 mg Intramuscular TID PRN Carrion-Carrero, Margely, MD       haloperidol  lactate (HALDOL ) injection 10 mg  10 mg Intramuscular TID PRN Carrion-Carrero, Margely, MD       And   diphenhydrAMINE  (BENADRYL ) injection 50 mg  50 mg Intramuscular TID PRN Carrion-Carrero, Margely, MD       And   LORazepam  (ATIVAN ) injection 2 mg  2 mg Intramuscular TID PRN Carrion-Carrero, Margely, MD       guaiFENesin -dextromethorphan (ROBITUSSIN DM) 100-10 MG/5ML syrup 10 mL  10 mL Oral Q4H PRN Bethea, Terrence C, MD   10 mL at 02/13/24 1708   hydrOXYzine  (ATARAX ) tablet 25 mg  25 mg Oral TID PRN Baltazar Bonier, MD   25 mg at 02/10/24 2144  magnesium  hydroxide (MILK OF MAGNESIA) suspension 30 mL  30 mL Oral Daily PRN Carrion-Carrero, Jacalyn Martin, MD   30 mL at 02/13/24 0958   pantoprazole (PROTONIX) EC tablet 40 mg  40 mg Oral Daily Benita Boonstra, Alonza Arthurs, MD   40 mg at 02/17/24 1001   traZODone  (DESYREL ) tablet 50 mg  50 mg Oral QHS PRN Baltazar Bonier, MD   50 mg at 02/16/24 2127   Current Outpatient Medications  Medication Sig Dispense Refill   aspirin  EC 81 MG tablet Take 1 tablet (81 mg total) by mouth at bedtime. Swallow whole. (Patient not  taking: Reported on 02/09/2024) 30 tablet 0   hydrOXYzine  (ATARAX ) 25 MG tablet Take 1 tablet (25 mg total) by mouth 3 (three) times daily as needed for anxiety. 30 tablet 0   [START ON 02/18/2024] pantoprazole (PROTONIX) 40 MG tablet Take 1 tablet (40 mg total) by mouth daily. 30 tablet 0   traZODone  (DESYREL ) 50 MG tablet Take 1 tablet (50 mg total) by mouth at bedtime as needed for sleep. 30 tablet 0    Labs  Lab Results:  Admission on 02/09/2024  Component Date Value Ref Range Status   WBC 02/12/2024 5.1  4.0 - 10.5 K/uL Final   RBC 02/12/2024 4.40  3.87 - 5.11 MIL/uL Final   Hemoglobin 02/12/2024 12.8  12.0 - 15.0 g/dL Final   HCT 16/07/9603 39.6  36.0 - 46.0 % Final   MCV 02/12/2024 90.0  80.0 - 100.0 fL Final   MCH 02/12/2024 29.1  26.0 - 34.0 pg Final   MCHC 02/12/2024 32.3  30.0 - 36.0 g/dL Final   RDW 54/06/8118 13.6  11.5 - 15.5 % Final   Platelets 02/12/2024 265  150 - 400 K/uL Final   nRBC 02/12/2024 0.0  0.0 - 0.2 % Final   Neutrophils Relative % 02/12/2024 40  % Final   Neutro Abs 02/12/2024 2.1  1.7 - 7.7 K/uL Final   Lymphocytes Relative 02/12/2024 52  % Final   Lymphs Abs 02/12/2024 2.6  0.7 - 4.0 K/uL Final   Monocytes Relative 02/12/2024 7  % Final   Monocytes Absolute 02/12/2024 0.4  0.1 - 1.0 K/uL Final   Eosinophils Relative 02/12/2024 1  % Final   Eosinophils Absolute 02/12/2024 0.1  0.0 - 0.5 K/uL Final   Basophils Relative 02/12/2024 0  % Final   Basophils Absolute 02/12/2024 0.0  0.0 - 0.1 K/uL Final   Immature Granulocytes 02/12/2024 0  % Final   Abs Immature Granulocytes 02/12/2024 0.01  0.00 - 0.07 K/uL Final   Performed at [facility], [address], [place], [place] [address]   Sodium 02/12/2024 143  135 - 145 mmol/L Final   Potassium 02/12/2024 3.9  3.5 - 5.1 mmol/L Final   Chloride 02/12/2024 106  98 - 111 mmol/L Final   CO2 02/12/2024 27  22 - 32 mmol/L Final   Glucose, Bld 02/12/2024 78  70 - 99 mg/dL Final   Glucose reference range  applies only to samples taken after fasting for at least 8 hours.   BUN 02/12/2024 13  6 - 20 mg/dL Final   Creatinine, Ser 02/12/2024 0.76  0.44 - 1.00 mg/dL Final   Calcium  02/12/2024 8.8 (L)  8.9 - 10.3 mg/dL Final   Total Protein 95/62/1308 6.3 (L)  6.5 - 8.1 g/dL Final   Albumin 65/78/4696 3.0 (L)  3.5 - 5.0 g/dL Final   AST 29/52/8413 16  15 - 41 U/L Final  ALT 02/12/2024 19  0 - 44 U/L Final   Alkaline Phosphatase 02/12/2024 84  38 - 126 U/L Final   Total Bilirubin 02/12/2024 0.3  0.0 - 1.2 mg/dL Final   GFR, Estimated 02/12/2024 >60  >60 mL/min Final   Comment: (NOTE) Calculated using the CKD-EPI Creatinine Equation (2021)    Anion gap 02/12/2024 10  5 - 15 Final   Performed at [facility], [address], [place], [place] [address]   TSH 02/12/2024 1.979  0.350 - 4.500 uIU/mL Final   Comment: Performed by a 3rd Generation assay with a functional sensitivity of <=0.01 uIU/mL. Performed at [facility], [address] [place], [place] [address]    Cholesterol 02/12/2024 191  0 - 200 mg/dL Final   Triglycerides 30/86/5784 78  <150 mg/dL Final   HDL 69/62/9528 56  >40 mg/dL Final   Total CHOL/HDL Ratio 02/12/2024 3.4  RATIO Final   VLDL 02/12/2024 16  0 - 40 mg/dL Final   LDL Cholesterol 02/12/2024 119 (H)  0 - 99 mg/dL Final   Comment:        Total Cholesterol/HDL:CHD Risk Coronary Heart Disease Risk Table                     Men   Women  1/2 Average Risk   3.4   3.3  Average Risk       5.0   4.4  2 X Average Risk   9.6   7.1  3 X Average Risk  23.4   11.0        Use the calculated Patient Ratio above and the CHD Risk Table to determine the patient's CHD Risk.        ATP III CLASSIFICATION (LDL):  <100     mg/dL   Optimal  413-244  mg/dL   Near or Above                    Optimal  130-159  mg/dL   Borderline  010-272  mg/dL   High  >536     mg/dL   Very High Performed at [facility], [address], [place], [place] [address]   Admission on  02/09/2024, Discharged on 02/09/2024  Component Date Value Ref Range Status   Preg Test, Ur 02/09/2024 Negative  Negative Final   POC Amphetamine UR 02/09/2024 None Detected  NONE DETECTED (Cut Off Level 1000 ng/mL) Final   POC Secobarbital (BAR) 02/09/2024 None Detected  NONE DETECTED (Cut Off Level 300 ng/mL) Final   POC Buprenorphine (BUP) 02/09/2024 None Detected  NONE DETECTED (Cut Off Level 10 ng/mL) Final   POC Oxazepam (BZO) 02/09/2024 None Detected  NONE DETECTED (Cut Off Level 300 ng/mL) Final   POC Cocaine UR 02/09/2024 Positive (A)  NONE DETECTED (Cut Off Level 300 ng/mL) Final   POC Methamphetamine UR 02/09/2024 None Detected  NONE DETECTED (Cut Off Level 1000 ng/mL) Final   POC Morphine 02/09/2024 None Detected  NONE DETECTED (Cut Off Level 300 ng/mL) Final   POC Methadone UR 02/09/2024 None Detected  NONE DETECTED (Cut Off Level 300 ng/mL) Final   POC Oxycodone UR 02/09/2024 None Detected  NONE DETECTED (Cut Off Level 100 ng/mL) Final   POC Marijuana UR 02/09/2024 Positive (A)  NONE DETECTED (Cut Off Level 50 ng/mL) Final  Admission on 11/21/2023, Discharged on 11/29/2023  Component Date Value Ref Range Status   Folate 11/24/2023 7.2  >5.9 ng/mL  Final   Performed at [facility], [address] [place], [place] [address]   RPR Ser Ql 11/24/2023 NON REACTIVE  NON REACTIVE Final   Performed at [facility], [address], [place], [place] [address]   Vit D, 25-Hydroxy 11/24/2023 10.71 (L)  30 - 100 ng/mL Final   Comment: (NOTE) Vitamin D  deficiency has been defined by the Institute of Medicine  and an Endocrine Society practice guideline as a level of serum 25-OH  vitamin D  less than 20 ng/mL (1,2). The Endocrine Society went on to  further define vitamin D  insufficiency as a level between 21 and 29  ng/mL (2).  1. IOM (Institute of Medicine). 2010. Dietary reference intakes for  calcium  and D. [place]: [employer]. 2. Holick  MF, Binkley [place] and [place], Bischoff-Ferrari HA, et al. Evaluation,  treatment, and prevention of vitamin D  deficiency: an Endocrine  Society clinical practice guideline, JCEM. 2011 Jul; 96(7): 1911-30.  Performed at [facility], [address], [place], [place] [address]    Sodium 11/24/2023 142  135 - 145 mmol/L Final   Potassium 11/24/2023 3.9  3.5 - 5.1 mmol/L Final   Chloride 11/24/2023 110  98 - 111 mmol/L Final   CO2 11/24/2023 26  22 - 32 mmol/L Final   Glucose, Bld 11/24/2023 81  70 - 99 mg/dL Final   Glucose reference range applies only to samples taken after fasting for at least 8 hours.   BUN 11/24/2023 14  6 - 20 mg/dL Final   Creatinine, Ser 11/24/2023 0.62  0.44 - 1.00 mg/dL Final   Calcium  11/24/2023 9.0  8.9 - 10.3 mg/dL Final   GFR, Estimated 11/24/2023 >60  >60 mL/min Final   Comment: (NOTE) Calculated using the CKD-EPI Creatinine Equation (2021)    Anion gap 11/24/2023 6  5 - 15 Final   Performed at [facility], [address], [place], [place] [address]   Sed Rate 11/24/2023 11  0 - 22 mm/hr Final   Performed at [facility], [address] [place], [place] [address]   CRP 11/24/2023 0.8  <1.0 mg/dL Final   Performed at [facility], [address] [place], [place] [address]   ANA Ab, IFA 11/24/2023 Negative   Final   Comment: (NOTE)                                     Negative   <1:80                                     Borderline  1:80                                     Positive   >1:80 ICAP nomenclature: AC-0 For more information about Hep-2 cell patterns use ANApatterns.org, the official website for the International Consensus on Antinuclear Antibody (ANA) Patterns (ICAP). Performed At: [facility] [address] [place], [place] [id number] Pearlean Botts MD WN:[id number]   Admission on 11/21/2023, Discharged on 11/21/2023  Component Date Value Ref Range Status   WBC 11/21/2023 6.5  4.0 - 10.5 K/uL  Final   RBC 11/21/2023 4.91  3.87 - 5.11 MIL/uL Final  Hemoglobin 11/21/2023 14.0  12.0 - 15.0 g/dL Final   HCT 16/07/9603 44.1  36.0 - 46.0 % Final   MCV 11/21/2023 89.8  80.0 - 100.0 fL Final   MCH 11/21/2023 28.5  26.0 - 34.0 pg Final   MCHC 11/21/2023 31.7  30.0 - 36.0 g/dL Final   RDW 54/06/8118 14.0  11.5 - 15.5 % Final   Platelets 11/21/2023 259  150 - 400 K/uL Final   nRBC 11/21/2023 0.0  0.0 - 0.2 % Final   Neutrophils Relative % 11/21/2023 61  % Final   Neutro Abs 11/21/2023 4.0  1.7 - 7.7 K/uL Final   Lymphocytes Relative 11/21/2023 31  % Final   Lymphs Abs 11/21/2023 2.0  0.7 - 4.0 K/uL Final   Monocytes Relative 11/21/2023 6  % Final   Monocytes Absolute 11/21/2023 0.4  0.1 - 1.0 K/uL Final   Eosinophils Relative 11/21/2023 1  % Final   Eosinophils Absolute 11/21/2023 0.1  0.0 - 0.5 K/uL Final   Basophils Relative 11/21/2023 1  % Final   Basophils Absolute 11/21/2023 0.0  0.0 - 0.1 K/uL Final   Immature Granulocytes 11/21/2023 0  % Final   Abs Immature Granulocytes 11/21/2023 0.02  0.00 - 0.07 K/uL Final   Performed at [facility], [address] [place], [place] [address]   Sodium 11/21/2023 146 (H)  135 - 145 mmol/L Final   Potassium 11/21/2023 3.7  3.5 - 5.1 mmol/L Final   Chloride 11/21/2023 104  98 - 111 mmol/L Final   CO2 11/21/2023 29  22 - 32 mmol/L Final   Glucose, Bld 11/21/2023 96  70 - 99 mg/dL Final   Glucose reference range applies only to samples taken after fasting for at least 8 hours.   BUN 11/21/2023 13  6 - 20 mg/dL Final   Creatinine, Ser 11/21/2023 0.80  0.44 - 1.00 mg/dL Final   Calcium  11/21/2023 9.3  8.9 - 10.3 mg/dL Final   Total Protein 95/62/1308 6.6  6.5 - 8.1 g/dL Final   Albumin 65/78/4696 3.9  3.5 - 5.0 g/dL Final   AST 29/52/8413 17  15 - 41 U/L Final   ALT 11/21/2023 19  0 - 44 U/L Final   Alkaline Phosphatase 11/21/2023 80  38 - 126 U/L Final   Total Bilirubin 11/21/2023 0.4  0.0 - 1.2 mg/dL Final   GFR, Estimated  11/21/2023 >60  >60 mL/min Final   Comment: (NOTE) Calculated using the CKD-EPI Creatinine Equation (2021)    Anion gap 11/21/2023 13  5 - 15 Final   Performed at [facility], [address], [place], [place] [address]   Hgb A1c MFr Bld 11/21/2023 5.8 (H)  4.8 - 5.6 % Final   Comment: (NOTE)         Prediabetes: 5.7 - 6.4         Diabetes: >6.4         Glycemic control for adults with diabetes: <7.0    Mean Plasma Glucose 11/21/2023 120  mg/dL Final   Comment: (NOTE) Performed At: [facility] [address] [place], [place] [id number] Pearlean Botts MD QI:[id number]    Magnesium  11/21/2023 2.2  1.7 - 2.4 mg/dL Final   Performed at [facility], [address], [place], [place] [address]   Alcohol, Ethyl (B) 11/21/2023 <10  <10 mg/dL Final   Comment: (NOTE) Lowest detectable limit for serum alcohol is 10 mg/dL.  For medical purposes only. Performed at [facility], [address], [place], [place] [address]    Cholesterol 11/21/2023 205 (H)  0 - 200 mg/dL Final   Triglycerides 47/82/9562 99  <150 mg/dL Final   HDL 13/05/6577 63  >40 mg/dL Final   Total CHOL/HDL Ratio 11/21/2023 3.3  RATIO Final   VLDL 11/21/2023 20  0 - 40 mg/dL Final   LDL Cholesterol 11/21/2023 122 (H)  0 - 99 mg/dL Final   Comment:        Total Cholesterol/HDL:CHD Risk Coronary Heart Disease Risk Table                     Men   Women  1/2 Average Risk   3.4   3.3  Average Risk       5.0   4.4  2 X Average Risk   9.6   7.1  3 X Average Risk  23.4   11.0        Use the calculated Patient Ratio above and the CHD Risk Table to determine the patient's CHD Risk.        ATP III CLASSIFICATION (LDL):  <100     mg/dL   Optimal  469-629  mg/dL   Near or Above                    Optimal  130-159  mg/dL   Borderline  528-413  mg/dL   High  >244     mg/dL   Very High Performed at [facility], [address], [place], [place] [address]    TSH 11/21/2023 0.723  0.350 -  4.500 uIU/mL Final   Comment: Performed by a 3rd Generation assay with a functional sensitivity of <=0.01 uIU/mL. Performed at [facility], [address], [place], [place] [address]    Preg Test, Ur 11/21/2023 Negative  Negative Final   Color, Urine 11/21/2023 AMBER (A)  YELLOW Final   BIOCHEMICALS MAY BE AFFECTED BY COLOR   APPearance 11/21/2023 CLOUDY (A)  CLEAR Final   Specific Gravity, Urine 11/21/2023 1.021  1.005 - 1.030 Final   pH 11/21/2023 6.0  5.0 - 8.0 Final   Glucose, UA 11/21/2023 NEGATIVE  NEGATIVE mg/dL Final   Hgb urine dipstick 11/21/2023 NEGATIVE  NEGATIVE Final   Bilirubin Urine 11/21/2023 NEGATIVE  NEGATIVE Final   Ketones, ur 11/21/2023 NEGATIVE  NEGATIVE mg/dL Final   Protein, ur 44/12/4740 30 (A)  NEGATIVE mg/dL Final   Nitrite 59/56/3875 NEGATIVE  NEGATIVE Final   Leukocytes,Ua 11/21/2023 SMALL (A)  NEGATIVE Final   RBC / HPF 11/21/2023 0-5  0 - 5 RBC/hpf Final   WBC, UA 11/21/2023 6-10  0 - 5 WBC/hpf Final   Bacteria, UA 11/21/2023 RARE (A)  NONE SEEN Final   Squamous Epithelial / HPF 11/21/2023 21-50  0 - 5 /HPF Final   Mucus 11/21/2023 PRESENT   Final   Performed at [facility], [address], [place], [place] [address]   POC Amphetamine UR 11/21/2023 None Detected  NONE DETECTED (Cut Off Level 1000 ng/mL) Final   POC Secobarbital (BAR) 11/21/2023 None Detected  NONE DETECTED (Cut Off Level 300 ng/mL) Final   POC Buprenorphine (BUP) 11/21/2023 None Detected  NONE DETECTED (Cut Off Level 10 ng/mL) Final   POC Oxazepam (BZO) 11/21/2023 None Detected  NONE DETECTED (Cut Off Level 300 ng/mL) Final   POC Cocaine UR 11/21/2023 Positive (A)  NONE DETECTED (Cut Off Level 300 ng/mL) Final  POC Methamphetamine UR 11/21/2023 None Detected  NONE DETECTED (Cut Off Level 1000 ng/mL) Final   POC Morphine 11/21/2023 None Detected  NONE DETECTED (Cut Off Level 300 ng/mL) Final   POC Methadone UR 11/21/2023 None Detected  NONE DETECTED (Cut Off Level 300 ng/mL)  Final   POC Oxycodone UR 11/21/2023 None Detected  NONE DETECTED (Cut Off Level 100 ng/mL) Final   POC Marijuana UR 11/21/2023 None Detected  NONE DETECTED (Cut Off Level 50 ng/mL) Final   Vitamin B-12 11/21/2023 306  180 - 914 pg/mL Final   Comment: (NOTE) This assay is not validated for testing neonatal or myeloproliferative syndrome specimens for Vitamin B12 levels. Performed at [facility], [address] [place], [place] [address]    T3, Free 11/21/2023 3.4  2.0 - 4.4 pg/mL Final   Comment: (NOTE) Performed At: [facility] [address] [place], [place] [id number] Pearlean Botts MD XB:[id number]    Free T4 11/21/2023 0.88  0.61 - 1.12 ng/dL Final   Comment: (NOTE) Biotin ingestion may interfere with free T4 tests. If the results are inconsistent with the TSH level, previous test results, or the clinical presentation, then consider biotin interference. If needed, order repeat testing after stopping biotin. Performed at [facility], [address] [place], [place] [address]    Preg Test, Ur 11/21/2023 NEGATIVE  NEGATIVE Final   Comment:        THE SENSITIVITY OF THIS METHODOLOGY IS >24 mIU/mL     Blood Alcohol level:  Lab Results  Component Value Date   ETH <10 11/21/2023   ETH <10 07/08/2023    Metabolic Disorder Labs: Lab Results  Component Value Date   HGBA1C 5.8 (H) 11/21/2023   MPG 120 11/21/2023   MPG 119.76 07/11/2023   Lab Results  Component Value Date   PROLACTIN 7.6 08/04/2022   Lab Results  Component Value Date   CHOL 191 02/12/2024   TRIG 78 02/12/2024   HDL 56 02/12/2024   CHOLHDL 3.4 02/12/2024   VLDL 16 02/12/2024   LDLCALC 119 (H) 02/12/2024   LDLCALC 122 (H) 11/21/2023    Therapeutic Lab Levels: No results found for: "LITHIUM" No results found for: "VALPROATE" No results found for: "CBMZ"  Physical Findings   AIMS    Flowsheet Row [facility]  AIMS Total Score 0      AUDIT    Flowsheet Row ED from 02/09/2024 in [facility] Admission (Discharged) [facility] Ad[facility] (Discharged[facility]  Alcohol Use Disorder Identification Test Final Score (AUDIT) 0 0 2      PHQ2-9    Flowsheet Row ED from 07/09/2023 in [facility] ED from 07/08/2023 in [facility] ED from 08/04/2022 in [place]  PHQ-2 Total Score 2 2 4   PHQ-9 Total Score 8 7 10       Flowsheet Row ED from 02/09/2024 in [facility] Most recent reading at 02/09/2024  4:07 PM ED from 02/09/2024 in [facility] Most recent reading at 02/09/2024  2:22 AM Admission (Discharged[facility] Most recent reading at 11/21/2023  5:40 PM  C-SSRS RISK CATEGORY Moderate Risk Low Risk Low Risk        Musculoskeletal  Strength &  Muscle Tone: within normal limits Gait & Station: normal Patient leans: N/A  Psychiatric Specialty Exam  Presentation  General Appearance:  Appropriate for Environment  Eye Contact: Fair  Speech: Normal Rate  Speech Volume: Normal  Handedness: Right   Mood and Affect  Mood: Euthymic  Affect: Appropriate   Thought Process  Thought Processes: Linear  Descriptions of Associations:Intact  Orientation:Full (Time, Place and Person)  Thought Content:Logical  Diagnosis of Schizophrenia or Schizoaffective disorder in past: No    Hallucinations:Hallucinations: None  Ideas of Reference:None  Suicidal Thoughts:Suicidal Thoughts: No  Homicidal Thoughts:Homicidal Thoughts: No   Sensorium  Memory: Immediate Fair; Remote Poor; Recent Fair  Judgment: Fair  Insight: Fair   Restaurant manager, fast food  Concentration: Fair  Attention Span: Fair  Recall: Fair  Fund of Knowledge: Fair  Language: Fair   Psychomotor Activity  Psychomotor Activity: Psychomotor Activity: Normal   Assets  Assets: Desire for Improvement; Leisure Time   Sleep  Sleep: Sleep: Good   No data recorded  Physical Exam  Physical Exam ROS Blood pressure 139/69, pulse 61, temperature 98.4 F (36.9 C), temperature source Oral, resp. rate 16, last menstrual period 11/18/2018, SpO2 100%. There is no height or weight on file to calculate BMI.  Treatment Plan Summary: Daily contact with patient to assess and evaluate symptoms and progress in treatment and Medication management   Patient currently declines psychotropic medications.    HTN Restarted home amlodipine    R gluteal laceration Wound care and neomycin  ointment   Nausea and reflux: Started protonix 40mg  PO daily  Vaginal odor/discharge: Getting UA to determine nature of infection, bacterial versus fungal   Other PRNs: acetaminophen , 650 mg, Q6H PRN alum & mag hydroxide-simeth, 30 mL, Q4H PRN haloperidol , 5 mg, TID PRN  And diphenhydrAMINE , 50 mg, TID PRN haloperidol  lactate, 5 mg, TID PRN  And diphenhydrAMINE , 50 mg, TID PRN  And LORazepam , 2 mg, TID PRN haloperidol  lactate, 10 mg, TID PRN  And diphenhydrAMINE , 50 mg, TID PRN  And LORazepam , 2 mg, TID PRN hydrOXYzine , 25 mg, TID PRN magnesium  hydroxide, 30 mL, Daily PRN traZODone , 50 mg, QHS PRN     Disposition: pt expressed interest in residential rehabilitation, but will have to have healed her buttocks wound to go. Tentative discharge for Friday  Floyce Hutching, MD 02/17/2024 4:52 PM

## 2024-02-17 NOTE — ED Notes (Signed)
 Patient assessed at bedside lying in bed asleep. Patient alert and oriented to self and location. Pt denies A/V/H. She denies having any thoughts/plan of self harm and harm towards others. Fluid and snack offered. Patient states that appetite has been good throughout the day. Pt reports, she wish she could stay here and to go [facility] on Monday.Verbalizes no further complaints at this time. Will continue to monitor and support.

## 2024-02-17 NOTE — ED Notes (Signed)
 Pt was provided dinner.

## 2024-02-17 NOTE — Group Note (Signed)
 Group Topic: Wellness  Group Date: 02/17/2024 Start Time: 1130 End Time: 1200 Facilitators: Chipper Council, NT  Department: [facility]  Number of Participants: 9  Group Focus: other Nutrition Treatment Modality:  Psychoeducation Interventions utilized were patient education Purpose: increase insight  Name: Alexandra Buckley Date of Birth: [birth year]  MR: [id number]    Level of Participation: active Quality of Participation: attentive Interactions with others: gave feedback Mood/Affect: appropriate Triggers (if applicable): n/a Cognition: coherent/clear Progress: Gaining insight Response: n/a Plan: patient will be encouraged to attend groups  Patients Problems:  Patient Active Problem List   Diagnosis Date Noted   Stimulant use disorder 02/09/2024   Prediabetes 08/10/2023   H/O: CVA (cerebrovascular accident) 07/11/2023   Substance induced mood disorder (HCC) 07/09/2023   Cocaine use 05/20/2023   Acute CVA (cerebrovascular accident) (HCC) 05/19/2023   History of trichomoniasis 10/08/2022   Recurrent major depressive disorder, in remission (HCC) 08/31/2022   Psychophysiological insomnia 08/31/2022   Essential hypertension 08/11/2022   Chronic left shoulder pain 08/11/2022   Chronic bilateral low back pain without sciatica 08/11/2022   Mixed hyperlipidemia 08/11/2022   History of suicide attempt 08/05/2022   Tobacco use disorder 08/05/2022   Cocaine abuse with cocaine-induced mood disorder (HCC) 08/04/2022   MDD (major depressive disorder), recurrent severe, without psychosis (HCC) 03/02/2017

## 2024-02-17 NOTE — ED Notes (Signed)
 Patient is resting with eyes closed. Respirations present.

## 2024-02-17 NOTE — Group Note (Signed)
 Group Topic: Recovery Basics  Group Date: 02/17/2024 Start Time: 1400 End Time: 1420 Facilitators: Arlan Belling, RN  Department: [facility]  Number of Participants: 10  Group Focus: chemical dependency issues Treatment Modality:  Behavior Modification Therapy Interventions utilized were exploration, group exercise, and mental fitness Purpose: express feelings, express irrational fears, improve communication skills, increase insight, and regain self-worth  Name: Alexandra Buckley Date of Birth: 1971/01/29  MR: [id number]    Level of Participation: moderate Quality of Participation: attentive and cooperative Interactions with others: gave feedback Mood/Affect: appropriate Triggers (if applicable):   Cognition: goal directed Progress: Moderate Response:   Plan: follow-up needed  Patients Problems:  Patient Active Problem List   Diagnosis Date Noted   Stimulant use disorder 02/09/2024   Prediabetes 08/10/2023   H/O: CVA (cerebrovascular accident) 07/11/2023   Substance induced mood disorder (HCC) 07/09/2023   Cocaine use 05/20/2023   Acute CVA (cerebrovascular accident) (HCC) 05/19/2023   History of trichomoniasis 10/08/2022   Recurrent major depressive disorder, in remission (HCC) 08/31/2022   Psychophysiological insomnia 08/31/2022   Essential hypertension 08/11/2022   Chronic left shoulder pain 08/11/2022   Chronic bilateral low back pain without sciatica 08/11/2022   Mixed hyperlipidemia 08/11/2022   History of suicide attempt 08/05/2022   Tobacco use disorder 08/05/2022   Cocaine abuse with cocaine-induced mood disorder (HCC) 08/04/2022   MDD (major depressive disorder), recurrent severe, without psychosis (HCC) 03/02/2017

## 2024-02-17 NOTE — ED Notes (Signed)
 Patient is sleeping. Respirations equal and unlabored, skin warm and dry. No change in assessment or acuity. Routine safety checks conducted according to facility protocol. Will continue to monitor for safety.

## 2024-02-17 NOTE — Group Note (Signed)
 Group Topic: Change and Accountability  Group Date: 02/17/2024 Start Time: 0730 End Time: 0800 Facilitators: Chattie Greeson , Lucian Rust, NT  Department: [facility]  Number of Participants: 8  Group Focus: acceptance, check in, and coping skills Treatment Modality:  Dialectical Behavioral Therapy Interventions utilized were clarification, confrontation, exploration, and support Purpose: express feelings, improve communication skills, increase insight, and regain self-worth  During our wrap up group from 19:30-20:00 we talked about change and accountability, reflecting on situations where we may have not taken full accountability and also how to prepare for difficult changes as we progress through life and as they progress through their recovery. Everyone participated well and enjoyed the counseling activity.  Name: Alexandra Buckley Date of Birth: [birth year]  MR: [id number]    Level of Participation: active Quality of Participation: attentive, cooperative, motivated, and offered feedback Interactions with others: gave feedback Mood/Affect: appropriate and bright Triggers (if applicable): N/A Cognition: coherent/clear, concrete, insightful, and logical Progress: Gaining insight Response: "it's time for me to get clean and strive for becoming sober, I have done this process 3 different times but this one feels different. Plan: patient will be encouraged to continue to keep her spirits high and think out all of her decisions as she continues her recovery.  Patients Problems:  Patient Active Problem List   Diagnosis Date Noted   Stimulant use disorder 02/09/2024   Prediabetes 08/10/2023   H/O: CVA (cerebrovascular accident) 07/11/2023   Substance induced mood disorder (HCC) 07/09/2023   Cocaine use 05/20/2023   Acute CVA (cerebrovascular accident) (HCC) 05/19/2023   History of trichomoniasis 10/08/2022   Recurrent major depressive disorder, in remission (HCC)  08/31/2022   Psychophysiological insomnia 08/31/2022   Essential hypertension 08/11/2022   Chronic left shoulder pain 08/11/2022   Chronic bilateral low back pain without sciatica 08/11/2022   Mixed hyperlipidemia 08/11/2022   History of suicide attempt 08/05/2022   Tobacco use disorder 08/05/2022   Cocaine abuse with cocaine-induced mood disorder (HCC) 08/04/2022   MDD (major depressive disorder), recurrent severe, without psychosis (HCC) 03/02/2017

## 2024-02-17 NOTE — ED Notes (Addendum)
 Patient scheduled for discharge to [facility] in the am. Patient A&O x 4, calm and cooperative. Patient open area to (R) thigh left open to air, no drainage, odor or discoloration observed. Patient denies SI, HI, AVH.

## 2024-02-17 NOTE — ED Notes (Signed)
 Patient is A&O x 4, calm cooperative with a pleasant and apprehensive affect. Patient denies SI, HI, AVH. Patient endorses being apprehensive concerning going inpatient. Patient states she don't know if she is mentally ready to stay for an extended period of time in a facility. Patient expressed she knows it's the uncertainty of change. Supportive listening engaged. Patient encouraged to journal the pros and cons of inpatient treatment and examine her goals and expectations.

## 2024-02-17 NOTE — ED Notes (Signed)
 Pt was provided lunch

## 2024-02-18 DIAGNOSIS — F1510 Other stimulant abuse, uncomplicated: Secondary | ICD-10-CM | POA: Diagnosis not present

## 2024-02-18 DIAGNOSIS — Z79899 Other long term (current) drug therapy: Secondary | ICD-10-CM | POA: Diagnosis not present

## 2024-02-18 DIAGNOSIS — F1410 Cocaine abuse, uncomplicated: Secondary | ICD-10-CM | POA: Diagnosis not present

## 2024-02-18 DIAGNOSIS — I10 Essential (primary) hypertension: Secondary | ICD-10-CM | POA: Diagnosis not present

## 2024-02-18 NOTE — ED Notes (Signed)
 Patient is sleeping. Respirations equal and unlabored, skin warm and dry. No change in assessment or acuity. Routine safety checks conducted according to facility protocol. Will continue to monitor for safety.

## 2024-02-18 NOTE — ED Notes (Signed)
 Patient alert & oriented x4. Denies intent to harm self or others when asked. Denies A/VH. Patient reports pain in L shoulder rating 7/10, denies need for pain medication at this time. No acute distress noted. Support and encouragement provided. Patient set to discharge to [facility] later this AM. No concerns voiced regarding this plan. Routine safety checks conducted per facility protocol. Encouraged patient to notify staff if any thoughts of harm towards self or others arise. Patient verbalizes understanding and agreement.

## 2024-02-18 NOTE — ED Notes (Signed)
 Patient discharged to [facility] per MD order. After Visit Summary (AVS) printed and given to patient, as well as printed prescriptions. AVS reviewed with patient and all questions fully answered. Patient discharged in no acute distress, A& O x4 and ambulatory. Patient denied SI/HI, A/VH upon discharge. Patient verbalized understanding of all discharge instructions explained by staff, including follow up appointments, and RX's. Patient mood fair. Patient belongings returned to patient from locker #28 complete and intact. Patient escorted to lobby via staff for transport to destination. Safety maintained.

## 2024-02-18 NOTE — Group Note (Unsigned)
 Group Topic: Communication  Group Date: 02/18/2024 Start Time: 1000 End Time: 1030 Facilitators: Jarmar Rousseau , Lucian Rust, NT  Department: [facility]  Number of Participants: 8  Group Focus: acceptance and check in Treatment Modality:  Cognitive Behavioral Therapy Interventions utilized were exploration Purpose: express feelings and improve communication skills   Name: Alexandra Buckley Date of Birth: 11/14/1970  MR: [id number]    Level of Participation: {THERAPIES; PSYCH GROUP PARTICIPATION UXLKG:40102} Quality of Participation: {THERAPIES; PSYCH QUALITY OF PARTICIPATION:23992} Interactions with others: {THERAPIES; PSYCH INTERACTIONS:23993} Mood/Affect: {THERAPIES; PSYCH MOOD/AFFECT:23994} Triggers (if applicable): *** Cognition: {THERAPIES; PSYCH COGNITION:23995} Progress: {THERAPIES; PSYCH PROGRESS:23997} Response: *** Plan: {THERAPIES; PSYCH VOZD:66440}  Patients Problems:  Patient Active Problem List   Diagnosis Date Noted   Stimulant use disorder 02/09/2024   Prediabetes 08/10/2023   H/O: CVA (cerebrovascular accident) 07/11/2023   Substance induced mood disorder (HCC) 07/09/2023   Cocaine use 05/20/2023   Acute CVA (cerebrovascular accident) (HCC) 05/19/2023   History of trichomoniasis 10/08/2022   Recurrent major depressive disorder, in remission (HCC) 08/31/2022   Psychophysiological insomnia 08/31/2022   Essential hypertension 08/11/2022   Chronic left shoulder pain 08/11/2022   Chronic bilateral low back pain without sciatica 08/11/2022   Mixed hyperlipidemia 08/11/2022   History of suicide attempt 08/05/2022   Tobacco use disorder 08/05/2022   Cocaine abuse with cocaine-induced mood disorder (HCC) 08/04/2022   MDD (major depressive disorder), recurrent severe, without psychosis (HCC) 03/02/2017

## 2024-02-18 NOTE — ED Notes (Signed)
 Patient sitting in bedroom, calm and composed. No acute distress noted. No concerns voiced. Informed patient to notify staff with any needs or assistance. Patient verbalized understanding or agreement. Safety checks in place per facility policy.

## 2024-03-20 ENCOUNTER — Other Ambulatory Visit (HOSPITAL_COMMUNITY)
Admission: RE | Admit: 2024-03-20 | Discharge: 2024-03-20 | Disposition: A | Discharge status: Home / Self Care | Payer: MEDICAID | Source: Ambulatory Visit | Attending: Physician Assistant | Admitting: Physician Assistant

## 2024-03-20 ENCOUNTER — Ambulatory Visit: Payer: MEDICAID | Admitting: Physician Assistant

## 2024-03-20 ENCOUNTER — Encounter: Payer: Self-pay | Admitting: Physician Assistant

## 2024-03-20 VITALS — BP 128/74 | HR 70 | Ht 62.0 in | Wt 165.0 lb

## 2024-03-20 DIAGNOSIS — Z8619 Personal history of other infectious and parasitic diseases: Secondary | ICD-10-CM | POA: Insufficient documentation

## 2024-03-20 DIAGNOSIS — N898 Other specified noninflammatory disorders of vagina: Secondary | ICD-10-CM | POA: Diagnosis present

## 2024-03-20 DIAGNOSIS — Z09 Encounter for follow-up examination after completed treatment for conditions other than malignant neoplasm: Secondary | ICD-10-CM | POA: Diagnosis not present

## 2024-03-20 DIAGNOSIS — F1414 Cocaine abuse with cocaine-induced mood disorder: Secondary | ICD-10-CM

## 2024-03-20 DIAGNOSIS — I10 Essential (primary) hypertension: Secondary | ICD-10-CM

## 2024-03-20 DIAGNOSIS — M545 Low back pain, unspecified: Secondary | ICD-10-CM

## 2024-03-20 DIAGNOSIS — G8929 Other chronic pain: Secondary | ICD-10-CM

## 2024-03-20 DIAGNOSIS — G4709 Other insomnia: Secondary | ICD-10-CM

## 2024-03-20 MED ORDER — METHOCARBAMOL 500 MG PO TABS: 1000.0000 mg | ORAL_TABLET | Freq: Three times a day (TID) | ORAL | 1 refills | Status: DC | PRN

## 2024-03-20 MED ORDER — TRAZODONE HCL 50 MG PO TABS: 150.0000 mg | ORAL_TABLET | Freq: Every evening | ORAL | 1 refills | Status: DC | PRN

## 2024-03-20 MED ORDER — AMLODIPINE BESYLATE 5 MG PO TABS: 5.0000 mg | ORAL_TABLET | Freq: Every day | ORAL | 0 refills | Status: DC

## 2024-03-20 MED ORDER — NAPROXEN 500 MG PO TABS
500.0000 mg | ORAL_TABLET | Freq: Two times a day (BID) | ORAL | 0 refills | Status: DC
Start: 2024-03-20 — End: 2024-04-10
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
## 2024-03-21 ENCOUNTER — Telehealth: Payer: Self-pay

## 2024-03-21 ENCOUNTER — Other Ambulatory Visit: Payer: Self-pay | Admitting: Physician Assistant

## 2024-03-21 NOTE — Telephone Encounter (Signed)
 Received an email from [facility], stating this patient is being seen by in house mental health provider. A rx for Trazodone  was 50 mg had already been prescribed. [facility] is requesting if you could cancel your order from yesterday.

## 2024-03-22 LAB — CERVICOVAGINAL ANCILLARY ONLY
Bacterial Vaginitis (gardnerella): POSITIVE — AB
Candida Glabrata: POSITIVE — AB
Candida Vaginitis: NEGATIVE
Chlamydia: POSITIVE — AB
Comment: NEGATIVE
Comment: NEGATIVE
Comment: NEGATIVE
Comment: NEGATIVE
Comment: NEGATIVE
Comment: NORMAL
Neisseria Gonorrhea: NEGATIVE
Trichomonas: NEGATIVE

## 2024-04-10 ENCOUNTER — Encounter: Payer: Self-pay | Admitting: Physician Assistant

## 2024-04-10 ENCOUNTER — Ambulatory Visit: Payer: MEDICAID | Admitting: Physician Assistant

## 2024-04-10 VITALS — BP 162/80 | HR 73 | Ht 62.5 in | Wt 171.0 lb

## 2024-04-10 DIAGNOSIS — K219 Gastro-esophageal reflux disease without esophagitis: Secondary | ICD-10-CM

## 2024-04-10 DIAGNOSIS — A749 Chlamydial infection, unspecified: Secondary | ICD-10-CM | POA: Diagnosis not present

## 2024-04-10 DIAGNOSIS — Z8673 Personal history of transient ischemic attack (TIA), and cerebral infarction without residual deficits: Secondary | ICD-10-CM

## 2024-04-10 DIAGNOSIS — F17200 Nicotine dependence, unspecified, uncomplicated: Secondary | ICD-10-CM

## 2024-04-10 DIAGNOSIS — N760 Acute vaginitis: Secondary | ICD-10-CM | POA: Diagnosis not present

## 2024-04-10 DIAGNOSIS — E559 Vitamin D deficiency, unspecified: Secondary | ICD-10-CM

## 2024-04-10 DIAGNOSIS — I10 Essential (primary) hypertension: Secondary | ICD-10-CM | POA: Diagnosis not present

## 2024-04-10 DIAGNOSIS — F1414 Cocaine abuse with cocaine-induced mood disorder: Secondary | ICD-10-CM

## 2024-04-10 DIAGNOSIS — B3731 Acute candidiasis of vulva and vagina: Secondary | ICD-10-CM

## 2024-04-10 DIAGNOSIS — B9689 Other specified bacterial agents as the cause of diseases classified elsewhere: Secondary | ICD-10-CM

## 2024-04-10 DIAGNOSIS — G8929 Other chronic pain: Secondary | ICD-10-CM

## 2024-04-10 DIAGNOSIS — M545 Low back pain, unspecified: Secondary | ICD-10-CM

## 2024-04-10 MED ORDER — METHOCARBAMOL 500 MG PO TABS: 1000.0000 mg | ORAL_TABLET | Freq: Three times a day (TID) | ORAL | 1 refills | Status: AC | PRN

## 2024-04-10 MED ORDER — NAPROXEN 500 MG PO TABS: 500.0000 mg | ORAL_TABLET | Freq: Two times a day (BID) | ORAL | 0 refills | Status: DC

## 2024-04-10 MED ORDER — FLUCONAZOLE 150 MG PO TABS
150.0000 mg | ORAL_TABLET | Freq: Every day | ORAL | 0 refills | Status: DC
Start: 2024-04-10 — End: 2024-08-04

## 2024-04-10 MED ORDER — ASPIRIN 81 MG PO TBEC: 81.0000 mg | DELAYED_RELEASE_TABLET | Freq: Every day | ORAL | 1 refills | Status: AC

## 2024-04-10 MED ORDER — AMLODIPINE BESYLATE 5 MG PO TABS: 5.0000 mg | ORAL_TABLET | Freq: Every day | ORAL | 1 refills | Status: DC

## 2024-04-10 MED ORDER — METRONIDAZOLE 500 MG PO TABS: 500.0000 mg | ORAL_TABLET | Freq: Two times a day (BID) | ORAL | 0 refills | Status: AC

## 2024-04-10 MED ORDER — PANTOPRAZOLE SODIUM 40 MG PO TBEC
40.0000 mg | DELAYED_RELEASE_TABLET | Freq: Every day | ORAL | 0 refills | Status: DC
Start: 2024-04-10 — End: 2024-08-04

## 2024-04-10 MED ORDER — DOXYCYCLINE HYCLATE 100 MG PO CAPS: 100.0000 mg | ORAL_CAPSULE | Freq: Two times a day (BID) | ORAL | 0 refills | Status: AC

## 2024-04-10 MED ORDER — VITAMIN D (ERGOCALCIFEROL) 1.25 MG (50000 UNIT) PO CAPS
50000.0000 [IU] | ORAL_CAPSULE | ORAL | 2 refills | Status: AC
Start: 2024-04-10 — End: ?

## 2024-04-10 NOTE — Progress Notes (Unsigned)
 Established Patient Office Visit  Subjective   Patient ID: Alexandra Buckley, female    DOB: December 07, 1970  Age: 53 y.o. MRN: [id number]  Chief Complaint  Patient presents with   Medication Refill   Exposure to STD  Discussed the use of AI scribe software for clinical note transcription with the patient, who gave verbal consent to proceed.  History of Present Illness   Alexandra Buckley is a 53 year old female who presents for medication refills and management of multiple infections.  She resides at [facility] for substance abuse treatment since May 16th and plans to stay for 90 days. She seeks refills for all her medications.   She tested positive for chlamydia, bacterial vaginitis, and a yeast infection, with ongoing symptoms of discharge, odor and itching, denies vaginal lesions or sores, fever, or nausea. Has not been treated.   Her blood pressure fluctuates, with readings as high as 150/60 and as low as 130, sometimes high in the morning. She frequently feels cold despite normal thyroid  function and recent labs indicating no anemia. She has a history of two strokes, the last in August, and is not currently taking aspirin . She takes naproxen  as needed for chronic low back pain and a muscle relaxer every eight hours, inquiring about adjusting to every six hours.    Results LABS Thyroid : Normal (02/12/2024) Vitamin D : 10.7 ng/mL (11/2023)      Past Medical History:  Diagnosis Date   History of suicide attempt 08/05/2022   2018   Homeless    HTN (hypertension)    Multiple cerebral infarctions (HCC)    Polysubstance abuse (HCC)    Tobacco use disorder 08/05/2022   Social History   Socioeconomic History   Marital status: Legally Separated    Spouse name: Not on file   Number of children: Not on file   Years of education: Not on file   Highest education level: Not on file  Occupational History   Not on file  Tobacco Use   Smoking status: Former    Types:  Cigarettes   Smokeless tobacco: Never  Vaping Use   Vaping status: Never Used  Substance and Sexual Activity   Alcohol use: Yes    Comment: Last date of usage- 05.04.2025   Drug use: Yes    Types: Cocaine    Comment: Heroin/Crack- Last date of usage 05.04.2025   Sexual activity: Not on file  Other Topics Concern   Not on file  Social History Narrative   Not on file   Social Drivers of Health   Financial Resource Strain: Not on file  Food Insecurity: Food Insecurity Present (02/09/2024)   Hunger Vital Sign    Worried About Running Out of Food in the Last Year: Often true    Ran Out of Food in the Last Year: Often true  Transportation Needs: No Transportation Needs (02/09/2024)   PRAPARE - Administrator, Civil Service (Medical): No    Lack of Transportation (Non-Medical): No  Recent Concern: Transportation Needs - Unmet Transportation Needs (02/09/2024)   PRAPARE - Administrator, Civil Service (Medical): Yes    Lack of Transportation (Non-Medical): Yes  Physical Activity: Not on file  Stress: Not on file  Social Connections: Unknown (02/17/2022)   Received from [facility]   Social Network    Social Network: Not on file  Intimate Partner Violence: Not At Risk (02/09/2024)   Humiliation, Afraid, Rape, and Kick questionnaire    Fear of  Current or Ex-Partner: No    Emotionally Abused: No    Physically Abused: No    Sexually Abused: No   History reviewed. No pertinent family history. No Known Allergies  Review of Systems  Constitutional:  Negative for chills and fever.  HENT: Negative.    Eyes: Negative.   Respiratory:  Negative for shortness of breath.   Cardiovascular:  Negative for chest pain.  Gastrointestinal:  Negative for abdominal pain, nausea and vomiting.  Genitourinary:  Negative for dysuria, frequency and hematuria.  Musculoskeletal:  Positive for back pain.  Skin: Negative.   Neurological: Negative.   Endo/Heme/Allergies: Negative.    Psychiatric/Behavioral: Negative.        Objective:     BP (!) 162/80 (BP Location: Left Arm, Patient Position: Sitting, Cuff Size: Large)   Pulse 73   Ht 5' 2.5 (1.588 m)   Wt 171 lb (77.6 kg)   LMP 11/18/2018   SpO2 100%   BMI 30.78 kg/m  BP Readings from Last 3 Encounters:  04/10/24 (!) 162/80  03/20/24 128/74  08/09/23 135/76   Wt Readings from Last 3 Encounters:  04/10/24 171 lb (77.6 kg)  03/20/24 165 lb (74.8 kg)  08/09/23 168 lb (76.2 kg)    Physical Exam Vitals and nursing note reviewed.    GENERAL: Alert, cooperative, well developed, no acute distress HEENT: Normocephalic, normal oropharynx, moist mucous membranes CHEST: Clear to auscultation bilaterally, no wheezes, rhonchi, or crackles CARDIOVASCULAR: Normal heart rate and rhythm, S1 and S2 normal without murmurs EXTREMITIES: No cyanosis or edema NEUROLOGICAL: Cranial nerves grossly intact, moves all extremities without gross motor or sensory deficit   Assessment & Plan:   Problem List Items Addressed This Visit       Cardiovascular and Mediastinum   Essential hypertension   Relevant Medications   amLODipine  (NORVASC ) 5 MG tablet   aspirin  EC 81 MG tablet     Nervous and Auditory   Cocaine abuse with cocaine-induced mood disorder (HCC)     Other   Tobacco use disorder   Chronic bilateral low back pain without sciatica   Relevant Medications   aspirin  EC 81 MG tablet   methocarbamol  (ROBAXIN ) 500 MG tablet   naproxen  (NAPROSYN ) 500 MG tablet   Other Visit Diagnoses       Chlamydia    -  Primary   Relevant Medications   fluconazole  (DIFLUCAN ) 150 MG tablet   metroNIDAZOLE  (FLAGYL ) 500 MG tablet   doxycycline  (VIBRAMYCIN ) 100 MG capsule     Bacterial vaginitis       Relevant Medications   fluconazole  (DIFLUCAN ) 150 MG tablet   metroNIDAZOLE  (FLAGYL ) 500 MG tablet     Vaginal yeast infection       Relevant Medications   fluconazole  (DIFLUCAN ) 150 MG tablet   metroNIDAZOLE  (FLAGYL )  500 MG tablet     Gastroesophageal reflux disease without esophagitis       Relevant Medications   pantoprazole  (PROTONIX ) 40 MG tablet     Vitamin D  deficiency       Relevant Medications   Vitamin D , Ergocalciferol , (DRISDOL ) 1.25 MG (50000 UNIT) CAPS capsule     Elevated blood pressure reading in office with diagnosis of hypertension       Relevant Medications   amLODipine  (NORVASC ) 5 MG tablet   aspirin  EC 81 MG tablet       PATHOLOGY Vaginal swab: Positive for Chlamydia trachomatis, bacterial vaginosis, and Candida infection (tested on 03/20/24)  Assessment and Plan Chlamydia infection Positive  test confirmed. Treatment and partner notification required. Abstinence advised during treatment. Retesting in 3 months due to resistance risk. - Prescribe doxycycline  twice daily for 7 days. - Advise partner notification for testing and treatment. - Schedule test of cure in 3 months. - Advise abstinence from intercourse during treatment.  Bacterial vaginosis Positive test confirmed. Requires antimicrobial treatment. - Prescribe metronidazole  twice daily for 7 days.  Candidiasis Positive test confirmed. Requires antifungal treatment. - Prescribe a single dose of Diflucan .  Hypertension Variable readings with some elevated. Emotional factors may contribute. - Monitor blood pressure regularly and document readings. - Review blood pressure readings in two weeks.  History of Stroke Two strokes. Neurology recommended aspirin  and Plavix  initially, then aspirin  alone. Currently not on aspirin . - Prescribe 81 mg aspirin  daily.  Chronic low back pain Managed with naproxen  and muscle relaxers. Current regimen maintained despite request for adjustment. - Continue naproxen  as needed for pain. - Continue muscle relaxer every 8 hours as needed.  Vitamin D  deficiency Level at 10.7 ng/mL. Supplementation required. May contribute to musculoskeletal pain. - Prescribe vitamin D  once weekly  for 12 weeks.  Continue in substance abuse treatment program  Follow up with [facility] in two weeks   I have reviewed the patient's medical history (PMH, PSH, Social History, Family History, Medications, and allergies) , and have been updated if relevant. I spent 30 minutes reviewing chart and  face to face time with patient.      Return in about 2 weeks (around 04/24/2024) for With [facility].    Kirk RAMAN Mayers, PA-C

## 2024-04-10 NOTE — Patient Instructions (Signed)
 VISIT SUMMARY:  Today, you came in for medication refills and to manage multiple infections. We discussed your current health issues, including infections, blood pressure, history of strokes, chronic low back pain, and vitamin D  deficiency. We have updated your treatment plan accordingly.  YOUR PLAN:  -CHLAMYDIA INFECTION: Chlamydia is a sexually transmitted infection that requires antibiotic treatment. You will take doxycycline  twice daily for 7 days. Please notify your partner so they can get tested and treated as well. Avoid sexual intercourse during the treatment period. We will retest you in 3 months to ensure the infection is cleared.  -BACTERIAL VAGINOSIS: Bacterial vaginosis is an infection caused by an imbalance of bacteria in the vagina. You will take metronidazole  twice daily for 7 days to treat this infection.  -CANDIDIASIS: Candidiasis, also known as a yeast infection, is a fungal infection. You will take a single dose of Diflucan  to treat this infection.  -HYPERTENSION: Hypertension, or high blood pressure, can fluctuate and may be influenced by emotional factors. Please monitor your blood pressure regularly and document the readings. We will review these readings in two weeks.  -STROKE: You have a history of two strokes. To help prevent another stroke, you will start taking 81 mg of aspirin  daily as recommended by your neurologist.  -CHRONIC LOW BACK PAIN: Your chronic low back pain is currently managed with naproxen  and a muscle relaxer. Continue taking naproxen  as needed for pain and the muscle relaxer every 8 hours as needed.  -VITAMIN D  DEFICIENCY: Your vitamin D  levels are low, which can contribute to musculoskeletal pain. You will continue taking a vitamin D  supplement once weekly for 12 weeks.  INSTRUCTIONS:  Please follow up in two weeks to review your blood pressure readings. Additionally, schedule a test of cure for chlamydia in 3 months.

## 2024-04-24 ENCOUNTER — Encounter: Payer: Self-pay | Admitting: Physician Assistant

## 2024-04-24 ENCOUNTER — Ambulatory Visit: Payer: MEDICAID | Admitting: Physician Assistant

## 2024-04-24 VITALS — BP 139/67 | HR 80 | Ht 62.5 in | Wt 176.0 lb

## 2024-04-24 DIAGNOSIS — I10 Essential (primary) hypertension: Secondary | ICD-10-CM | POA: Diagnosis not present

## 2024-04-24 DIAGNOSIS — F1414 Cocaine abuse with cocaine-induced mood disorder: Secondary | ICD-10-CM

## 2024-04-24 DIAGNOSIS — A749 Chlamydial infection, unspecified: Secondary | ICD-10-CM

## 2024-04-24 MED ORDER — AMLODIPINE BESYLATE 10 MG PO TABS: 10.0000 mg | ORAL_TABLET | Freq: Every day | ORAL | 1 refills | Status: AC

## 2024-04-24 MED ORDER — AZITHROMYCIN 250 MG PO TABS: ORAL_TABLET | ORAL | 0 refills | Status: DC

## 2024-04-24 NOTE — Patient Instructions (Signed)
 VISIT SUMMARY:  During your visit, we discussed your elevated blood pressure, lightheadedness, persistent vaginal discharge, and chest pain when coughing or sneezing. We reviewed your current medications and made some adjustments to better manage your symptoms.  YOUR PLAN:  -VAGINAL DISCHARGE: You have a persistent brown vaginal discharge that has not fully resolved after previous treatment. This may be due to bacterial resistance. We will treat this with a one-time dose of azithromycin , an antibiotic.  -HYPERTENSION: Your blood pressure has been slightly elevated, which is causing lightheadedness. Hypertension means high blood pressure. We will increase your dosage of amlodipine  to help better control your blood pressure.
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
## 2024-04-25 ENCOUNTER — Encounter: Payer: Self-pay | Admitting: Physician Assistant

## 2024-06-08 ENCOUNTER — Other Ambulatory Visit (HOSPITAL_COMMUNITY): Payer: Self-pay | Admitting: Physician Assistant

## 2024-06-08 ENCOUNTER — Other Ambulatory Visit (HOSPITAL_COMMUNITY): Payer: Self-pay | Admitting: Internal Medicine

## 2024-06-08 DIAGNOSIS — I639 Cerebral infarction, unspecified: Secondary | ICD-10-CM

## 2024-06-08 DIAGNOSIS — W19XXXA Unspecified fall, initial encounter: Secondary | ICD-10-CM

## 2024-08-04 ENCOUNTER — Ambulatory Visit (INDEPENDENT_AMBULATORY_CARE_PROVIDER_SITE_OTHER): Payer: MEDICAID

## 2024-08-04 ENCOUNTER — Ambulatory Visit (HOSPITAL_COMMUNITY): Payer: Self-pay | Admitting: Emergency Medicine

## 2024-08-04 ENCOUNTER — Ambulatory Visit (HOSPITAL_COMMUNITY)
Admission: EM | Admit: 2024-08-04 | Discharge: 2024-08-04 | Disposition: A | Discharge status: Home / Self Care | Payer: MEDICAID | Attending: Emergency Medicine | Admitting: Emergency Medicine

## 2024-08-04 ENCOUNTER — Encounter (HOSPITAL_COMMUNITY): Payer: Self-pay

## 2024-08-04 DIAGNOSIS — M25512 Pain in left shoulder: Secondary | ICD-10-CM

## 2024-08-04 DIAGNOSIS — M545 Low back pain, unspecified: Secondary | ICD-10-CM

## 2024-08-04 DIAGNOSIS — M150 Primary generalized (osteo)arthritis: Secondary | ICD-10-CM

## 2024-08-04 DIAGNOSIS — M25532 Pain in left wrist: Secondary | ICD-10-CM | POA: Diagnosis not present

## 2024-08-04 DIAGNOSIS — M542 Cervicalgia: Secondary | ICD-10-CM | POA: Diagnosis not present

## 2024-08-04 MED ORDER — DICLOFENAC SODIUM 75 MG PO TBEC: 75.0000 mg | DELAYED_RELEASE_TABLET | Freq: Two times a day (BID) | ORAL | 2 refills | Status: AC

## 2024-08-04 MED ORDER — KETOROLAC TROMETHAMINE 30 MG/ML IJ SOLN
INTRAMUSCULAR | Status: AC
  Filled 2024-08-04: qty 1

## 2024-08-04 MED ORDER — ACETAMINOPHEN 500 MG PO TABS: 1000.0000 mg | ORAL_TABLET | Freq: Three times a day (TID) | ORAL | 2 refills | Status: AC

## 2024-08-04 MED ORDER — KETOROLAC TROMETHAMINE 30 MG/ML IJ SOLN
30.0000 mg | Freq: Once | INTRAMUSCULAR | Status: AC
  Administered 2024-08-04: 30 mg via INTRAMUSCULAR

## 2024-08-04 NOTE — Progress Notes (Signed)
 Patient advised we will contact her if radiology report is different from provider interpretation.  Radiology report is consistent with provider interpretation.  No change to current plan of care.

## 2024-08-04 NOTE — ED Triage Notes (Signed)
 Pt c/o chronic back pain and in the past week pain is worse. States from neck to lt shoulder to lt wrist pain and lower back. States taking muscle relaxants with short relief. Denies new injury.

## 2024-08-04 NOTE — Discharge Instructions (Signed)
 All of the imaging that we performed today reveals osteoarthritis.  Joint pain can lead to muscular spasm which causes more pain.  I believe that is why the muscle relaxer you are taking does provide you with some temporary relief.  During your visit today, you received an injection of a nonsteroidal anti-inflammatory pain medication called ketorolac.  I believe this keeps your pain well-controlled until this evening.    This evening, I recommend that you begin a twice daily dose of a nonsteroidal anti-inflammatory pain medication, similar to ibuprofen , called diclofenac.  I have sent a prescription to your pharmacy.  I would like for you to take this on a regular basis, not as needed.  I would also like for you to begin taking Tylenol  1000 mg 3 times daily to help keep your pain well-controlled.  I have sent a prescription for this to your pharmacy as well.    When taken together, diclofenac and Tylenol  be very effective at keeping muscle pain and joint pain very well-controlled.  You are welcome to continue taking your muscle relaxer, methocarbamol  as directed.  Please follow-up with your primary care provider for ongoing management of arthritic pain and muscle spasm.  Thank you for visiting [facility] today.
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
## 2024-08-14 ENCOUNTER — Ambulatory Visit (INDEPENDENT_AMBULATORY_CARE_PROVIDER_SITE_OTHER): Payer: MEDICAID

## 2024-08-14 ENCOUNTER — Ambulatory Visit (HOSPITAL_COMMUNITY): Payer: MEDICAID

## 2024-08-14 ENCOUNTER — Encounter (HOSPITAL_COMMUNITY): Payer: Self-pay | Admitting: Emergency Medicine

## 2024-08-14 ENCOUNTER — Other Ambulatory Visit: Payer: Self-pay

## 2024-08-14 ENCOUNTER — Ambulatory Visit (HOSPITAL_COMMUNITY)
Admission: EM | Admit: 2024-08-14 | Discharge: 2024-08-14 | Disposition: A | Discharge status: Home / Self Care | Payer: MEDICAID

## 2024-08-14 DIAGNOSIS — M25562 Pain in left knee: Secondary | ICD-10-CM

## 2024-08-14 DIAGNOSIS — W19XXXA Unspecified fall, initial encounter: Secondary | ICD-10-CM

## 2024-08-14 DIAGNOSIS — Z8719 Personal history of other diseases of the digestive system: Secondary | ICD-10-CM

## 2024-08-14 DIAGNOSIS — K921 Melena: Secondary | ICD-10-CM | POA: Diagnosis not present

## 2024-08-14 DIAGNOSIS — M1712 Unilateral primary osteoarthritis, left knee: Secondary | ICD-10-CM | POA: Diagnosis not present

## 2024-08-14 MED ORDER — LIDOCAINE HCL (PF) 1 % IJ SOLN
INTRAMUSCULAR | Status: AC
  Filled 2024-08-14: qty 6

## 2024-08-14 MED ORDER — LIDOCAINE HCL (PF) 1 % IJ SOLN
5.0000 mL | Freq: Once | INTRAMUSCULAR | Status: AC
  Administered 2024-08-14: 5 mL

## 2024-08-14 MED ORDER — TRIAMCINOLONE ACETONIDE 40 MG/ML IJ SUSP
40.0000 mg | Freq: Once | INTRAMUSCULAR | Status: AC
  Administered 2024-08-14: 40 mg via INTRA_ARTICULAR

## 2024-08-14 NOTE — ED Provider Notes (Signed)
 [facility]    CSN: [id number] Arrival date & time: 08/14/24  0930      History   Chief Complaint Chief Complaint  Patient presents with   Fall    HPI Alexandra Buckley is a 53 y.o. female.  Here today for evaluation of left knee pain from recent fall, chronic back pain, and recent episode of bright red blood per rectum with performing bowel movements.  Says her left knee has been bothering her prior to this and that she has bone-on-bone arthritis.  Has received some treatment in the past from sports medicine.  Since her last cortisone injection was in 2023 which was very successful at reducing pain.  In terms of her bloody stool, says she was in the lobby and had a bowel movement with some blood mixed in with her poop.  It was bright red on toilet paper when she wiped.  Says she does have a history of hemorrhoids but has not been present for the last couple of months.  Has been constipated recently.  Denies any pain with defecation.  Denies taking any ibuprofen /naproxen  for pain recently.  Was prescribed diclofenac recently for low back pain on 07/25/2024.  Does take daily aspirin .   Fall    Past Medical History:  Diagnosis Date   History of suicide attempt 08/05/2022   2018   Homeless    HTN (hypertension)    Multiple cerebral infarctions (HCC)    Polysubstance abuse (HCC)    Tobacco use disorder 08/05/2022    Patient Active Problem List   Diagnosis Date Noted   Stimulant use disorder 02/09/2024   Prediabetes 08/10/2023   H/O: CVA (cerebrovascular accident) 07/11/2023   Substance induced mood disorder (HCC) 07/09/2023   Cocaine use 05/20/2023   Acute CVA (cerebrovascular accident) (HCC) 05/19/2023   History of trichomoniasis 10/08/2022   Recurrent major depressive disorder, in remission 08/31/2022   Psychophysiological insomnia 08/31/2022   Essential hypertension 08/11/2022   Chronic left shoulder pain 08/11/2022   Chronic bilateral low back pain without  sciatica 08/11/2022   Mixed hyperlipidemia 08/11/2022   History of suicide attempt 08/05/2022   Tobacco use disorder 08/05/2022   Cocaine abuse with cocaine-induced mood disorder (HCC) 08/04/2022   MDD (major depressive disorder), recurrent severe, without psychosis (HCC) 03/02/2017    No past surgical history on file.  OB History   No obstetric history on file.      Home Medications    Prior to Admission medications   Medication Sig Start Date End Date Taking? Authorizing Provider  acetaminophen  (TYLENOL ) 500 MG tablet Take 2 tablets (1,000 mg total) by mouth every 8 (eight) hours. 08/04/24 11/02/24  Joesph Shaver Scales, PA-C  amLODipine  (NORVASC ) 10 MG tablet Take 1 tablet (10 mg total) by mouth daily. 04/24/24   Mayers, Kirk RAMAN, PA-C  aspirin  EC 81 MG tablet Take 1 tablet (81 mg total) by mouth at bedtime. Swallow whole. 04/10/24   Mayers, Cari S, PA-C  diclofenac (VOLTAREN) 75 MG EC tablet Take 1 tablet (75 mg total) by mouth 2 (two) times daily. 08/04/24 11/02/24  Joesph Shaver Scales, PA-C  hydrOXYzine  (ATARAX ) 25 MG tablet Take 1 tablet (25 mg total) by mouth 3 (three) times daily as needed for anxiety. 02/17/24   Leigh Corean Massa, MD  methocarbamol  (ROBAXIN ) 500 MG tablet Take 2 tablets (1,000 mg total) by mouth every 8 (eight) hours as needed for muscle spasms. 04/10/24   Mayers, Cari S, PA-C  Vitamin D , Ergocalciferol , (DRISDOL ) 1.25  MG (50000 UNIT) CAPS capsule Take 1 capsule (50,000 Units total) by mouth every 7 (seven) days. 04/10/24   Mayers, Kirk RAMAN, PA-C    Family History No family history on file.  Social History Social History   Tobacco Use   Smoking status: Former    Types: Cigarettes   Smokeless tobacco: Never  Vaping Use   Vaping status: Every Day  Substance Use Topics   Alcohol use: Yes    Comment: Last date of usage- 05.04.2025   Drug use: Not Currently    Types: Cocaine    Comment: Heroin/Crack- Last date of usage 05.04.2025     Allergies    Patient has no known allergies.   Review of Systems Review of Systems  ROS negative except as noted in HPI above   Physical Exam Triage Vital Signs ED Triage Vitals  Encounter Vitals Group     BP      Girls Systolic BP Percentile      Girls Diastolic BP Percentile      Boys Systolic BP Percentile      Boys Diastolic BP Percentile      Pulse      Resp      Temp      Temp src      SpO2      Weight      Height      Head Circumference      Peak Flow      Pain Score      Pain Loc      Pain Education      Exclude from Growth Chart    No data found.  Updated Vital Signs LMP 11/18/2018   Visual Acuity Right Eye Distance:   Left Eye Distance:   Bilateral Distance:    Right Eye Near:   Left Eye Near:    Bilateral Near:     Physical Exam Vitals and nursing note reviewed.  Constitutional:      General: She is not in acute distress.    Appearance: She is well-developed.  HENT:     Head: Normocephalic and atraumatic.  Eyes:     Conjunctiva/sclera: Conjunctivae normal.  Cardiovascular:     Rate and Rhythm: Normal rate and regular rhythm.     Heart sounds: No murmur heard. Pulmonary:     Effort: Pulmonary effort is normal. No respiratory distress.     Breath sounds: Normal breath sounds.  Abdominal:     Palpations: Abdomen is soft.     Tenderness: There is no abdominal tenderness.  Genitourinary:    Comments: Pt declined rectal exam at this time Musculoskeletal:        General: No swelling.     Cervical back: Neck supple.     Right knee: Swelling, effusion and crepitus present. No LCL laxity, MCL laxity, ACL laxity or PCL laxity. Abnormal patellar mobility.  Skin:    General: Skin is warm and dry.     Capillary Refill: Capillary refill takes less than 2 seconds.  Neurological:     Mental Status: She is alert.  Psychiatric:        Mood and Affect: Mood normal.      UC Treatments / Results  Labs (all labs ordered are listed, but only abnormal results  are displayed) Labs Reviewed - No data to display  EKG   Radiology No results found.  Procedures Procedures (including critical care time)  Medications Ordered in UC Medications - No data to display  Initial Impression / Assessment and Plan / UC Course  I have reviewed the triage vital signs and the nursing notes.  Pertinent labs & imaging results that were available during my care of the patient were reviewed by me and considered in my medical decision making (see chart for details).     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  Recommend Tylenol  for any additional pain  #Suspected internal hemorrhoids Discussed differential for bright red blood per rectum including but not limited to internal hemorrhoids, neoplasm, GI bleed from recent NSAID use, AVM, fissure versus other.  Most likely internal hemorrhoids given past history of hemorrhoids. However, recommend stopping previously prescribed diclofenac Continue aspirin  for stroke prevention Take 1 Full of MiraLAX  daily to prevent further constipation If you continue to have rectal bleeding please follow-up with your PCP or GI doctor soon as possible or return to urgent care for further eval No evidence of hypotension on blood pressure or tachycardia, likely very low volume blood loss. If you have a sudden increase in volume of rectal bleeding please proceed to emergency room for further management of care.  Final diagnoses:  None   Discharge Instructions   None    ED Prescriptions   None     PDMP not reviewed this encounter.   Lynwood Barter, DO 08/14/24 1310

## 2024-08-14 NOTE — Discharge Instructions (Addendum)
 You were given left corticosteroid knee injection today for patellofemoral knee arthritis Rest knee for next 2-3 days Begin physical therapy for left knee osteoarthritis; referral placed at today's appointment Do not take NSAIDs for left knee pain because of concern of GI bleed.  Recommend Tylenol  for any additional pain  In terms of rectal bleeding, recommend stopping previously prescribed diclofenac Continue aspirin  for stroke prevention Take 1 Full of MiraLAX  daily to prevent further constipation If you continue to have rectal bleeding please follow-up with your PCP or GI doctor soon as possible If you have a sudden increase in volume of rectal bleeding please proceed to emergency room for further management of care.

## 2024-08-14 NOTE — ED Triage Notes (Signed)
 Patient fell this morning.  Reports left knee went out .  Reports head did hit the floor, no loc, no obvious injury to head.   Patient reports left knee hurts, back and reports while having a BM in lobby bathroom, noted bright blood to her stool  Has not had any medicines for symptoms  Patient used a cane to walk to treatment room
